# Patient Record
Sex: Female | Born: 1955
Health system: Southern US, Community
[De-identification: ages and names within clinical notes are randomized; demographics above are authoritative.]

## PROBLEM LIST (undated history)

## (undated) DIAGNOSIS — I10 Essential (primary) hypertension: Secondary | ICD-10-CM

## (undated) DIAGNOSIS — IMO0002 Reserved for concepts with insufficient information to code with codable children: Secondary | ICD-10-CM

## (undated) DIAGNOSIS — I214 Non-ST elevation (NSTEMI) myocardial infarction: Secondary | ICD-10-CM

## (undated) DIAGNOSIS — E785 Hyperlipidemia, unspecified: Secondary | ICD-10-CM

## (undated) DIAGNOSIS — M1990 Unspecified osteoarthritis, unspecified site: Secondary | ICD-10-CM

## (undated) DIAGNOSIS — M329 Systemic lupus erythematosus, unspecified: Secondary | ICD-10-CM

## (undated) DIAGNOSIS — M797 Fibromyalgia: Secondary | ICD-10-CM

## (undated) DIAGNOSIS — E669 Obesity, unspecified: Secondary | ICD-10-CM

## (undated) HISTORY — PX: ABDOMINAL HYSTERECTOMY: SHX81

## (undated) HISTORY — DX: Reserved for concepts with insufficient information to code with codable children: IMO0002

## (undated) HISTORY — DX: Essential (primary) hypertension: I10

## (undated) HISTORY — DX: Hyperlipidemia, unspecified: E78.5

## (undated) HISTORY — PX: OTHER SURGICAL HISTORY: SHX169

## (undated) HISTORY — DX: Obesity, unspecified: E66.9

## (undated) HISTORY — DX: Non-ST elevation (NSTEMI) myocardial infarction: I21.4

## (undated) HISTORY — PX: REDUCTION MAMMAPLASTY: SUR839

## (undated) HISTORY — DX: Systemic lupus erythematosus, unspecified: M32.9

## (undated) HISTORY — DX: Unspecified osteoarthritis, unspecified site: M19.90

## (undated) HISTORY — PX: BREAST SURGERY: SHX581

## (undated) HISTORY — PX: CARPAL TUNNEL RELEASE: SHX101

## (undated) HISTORY — DX: Fibromyalgia: M79.7

---

## 2010-05-01 ENCOUNTER — Ambulatory Visit: Payer: Self-pay | Admitting: Family Medicine

## 2010-05-07 ENCOUNTER — Ambulatory Visit (INDEPENDENT_AMBULATORY_CARE_PROVIDER_SITE_OTHER): Payer: Self-pay | Admitting: Family Medicine

## 2010-05-07 VITALS — BP 146/82 | HR 69 | Ht 61.0 in | Wt 240.0 lb

## 2010-05-07 DIAGNOSIS — M329 Systemic lupus erythematosus, unspecified: Secondary | ICD-10-CM

## 2010-05-07 DIAGNOSIS — J309 Allergic rhinitis, unspecified: Secondary | ICD-10-CM

## 2010-05-07 DIAGNOSIS — I10 Essential (primary) hypertension: Secondary | ICD-10-CM

## 2010-05-07 MED ORDER — HYDROXYCHLOROQUINE SULFATE 200 MG PO TABS: 200.0000 mg | ORAL_TABLET | Freq: Two times a day (BID) | ORAL | Status: DC

## 2010-05-07 MED ORDER — MOMETASONE FUROATE 50 MCG/ACT NA SUSP: 2.0000 | Freq: Every day | NASAL | Status: DC

## 2010-05-07 NOTE — Patient Instructions (Signed)
Referral to rheumatology made. Victorino Dike will call you w/ appt info for Dr. Ancil Linsey.  Plaquenil and Nasonex RFd today.  Work on [employer], regular exercise.  Return for f/u blood pressure/ wt in 2 months.

## 2010-05-20 ENCOUNTER — Encounter: Payer: Self-pay | Admitting: Emergency Medicine

## 2010-05-20 ENCOUNTER — Inpatient Hospital Stay (INDEPENDENT_AMBULATORY_CARE_PROVIDER_SITE_OTHER)
Admission: RE | Admit: 2010-05-20 | Discharge: 2010-05-20 | Disposition: A | Discharge status: Home / Self Care | Payer: Medicare PPO | Source: Ambulatory Visit | Attending: Emergency Medicine | Admitting: Emergency Medicine

## 2010-05-20 DIAGNOSIS — I10 Essential (primary) hypertension: Secondary | ICD-10-CM | POA: Insufficient documentation

## 2010-05-20 DIAGNOSIS — M810 Age-related osteoporosis without current pathological fracture: Secondary | ICD-10-CM | POA: Insufficient documentation

## 2010-05-20 DIAGNOSIS — E785 Hyperlipidemia, unspecified: Secondary | ICD-10-CM | POA: Insufficient documentation

## 2010-05-20 DIAGNOSIS — M1990 Unspecified osteoarthritis, unspecified site: Secondary | ICD-10-CM | POA: Insufficient documentation

## 2010-05-20 DIAGNOSIS — J019 Acute sinusitis, unspecified: Secondary | ICD-10-CM | POA: Insufficient documentation

## 2010-05-20 LAB — CONVERTED CEMR LAB: Rapid Strep: NEGATIVE

## 2010-06-26 ENCOUNTER — Encounter: Payer: Self-pay | Admitting: Family Medicine

## 2010-06-26 DIAGNOSIS — I10 Essential (primary) hypertension: Secondary | ICD-10-CM | POA: Insufficient documentation

## 2010-06-26 DIAGNOSIS — M3500 Sicca syndrome, unspecified: Secondary | ICD-10-CM | POA: Insufficient documentation

## 2010-06-26 NOTE — Assessment & Plan Note (Signed)
bp high today on metoprolol and hctz.  Will not change her today but have her return in 2 wks for a nurse bp check

## 2010-06-26 NOTE — Assessment & Plan Note (Signed)
BMI 45 c/w class III obesity. Will spend more time discussing wt mgmt in the near future.

## 2010-06-26 NOTE — Assessment & Plan Note (Signed)
Will get her back in with Dr Ancil Linsey and bridge her Plaquenil until her next appt.  Will try to get her last set of labs.

## 2010-06-26 NOTE — Progress Notes (Signed)
  Subjective:    Patient ID: Sharon Reilly, female    DOB: Jul 27, 1955, 55 y.o.   MRN: [id number]  HPI  55 yo WF presents for NOV.  She has a hx of SLE, followed by Dr Ancil Linsey, on Plaquenil.  She has HTN on HCTZ and metoprolol and thinks her labs are UTD.  She would like a PF on Plaquenil and NAsonex today.  She struggles w/ her wt, at at BMI of 45.    BP 146/82  Pulse 69  Ht 5\' 1"  (1.549 m)  Wt 240 lb (108.863 kg)  BMI 45.35 kg/m2  SpO2 98%     Review of Systems  Constitutional: Negative for fever and fatigue.  Eyes: Negative for visual disturbance.  Respiratory: Negative for shortness of breath and wheezing.   Cardiovascular: Negative for chest pain, palpitations and leg swelling.  Gastrointestinal: Negative for nausea, diarrhea and constipation.  Musculoskeletal: Positive for myalgias and arthralgias.  Neurological: Negative for weakness and headaches.  Psychiatric/Behavioral: Negative for sleep disturbance and dysphoric mood. The patient is not nervous/anxious.        Objective:   Physical Exam  Constitutional: She appears well-developed and well-nourished. No distress.       obese  Eyes: Pupils are equal, round, and reactive to light.  Neck: Neck supple. No thyromegaly present.  Cardiovascular: Normal rate, regular rhythm and normal heart sounds.   No murmur heard. Pulmonary/Chest: Effort normal and breath sounds normal. No respiratory distress. She has no wheezes.  Musculoskeletal: She exhibits no edema.  Skin: Skin is warm and dry.  Psychiatric: She has a normal mood and affect.          Assessment & Plan:

## 2010-06-29 ENCOUNTER — Encounter: Payer: Self-pay | Admitting: Family Medicine

## 2010-07-02 ENCOUNTER — Ambulatory Visit (INDEPENDENT_AMBULATORY_CARE_PROVIDER_SITE_OTHER): Payer: Medicare PPO | Admitting: Family Medicine

## 2010-07-02 ENCOUNTER — Encounter: Payer: Self-pay | Admitting: Family Medicine

## 2010-07-02 ENCOUNTER — Ambulatory Visit
Admission: RE | Admit: 2010-07-02 | Discharge: 2010-07-02 | Disposition: A | Discharge status: Home / Self Care | Payer: Medicare PPO | Source: Ambulatory Visit | Attending: Family Medicine | Admitting: Family Medicine

## 2010-07-02 VITALS — BP 131/64 | HR 76 | Ht 61.5 in | Wt 243.0 lb

## 2010-07-02 DIAGNOSIS — M5416 Radiculopathy, lumbar region: Secondary | ICD-10-CM | POA: Insufficient documentation

## 2010-07-02 DIAGNOSIS — IMO0002 Reserved for concepts with insufficient information to code with codable children: Secondary | ICD-10-CM

## 2010-07-02 MED ORDER — PREDNISONE 20 MG PO TABS: ORAL_TABLET | ORAL | Status: DC

## 2010-07-02 NOTE — Progress Notes (Signed)
  Subjective:    Patient ID: Sharon Reilly, female    DOB: June 01, 1955, 55 y.o.   MRN: [id number]  HPI 55 yo AAF presents for L foot pain.  She had surgery on her 1st metatarsal in 2000.  She continues to get pain and swelling in her L foot and leg.  She points to toes 2 and 3 on her R foot as where her foot hurts.  Describes pain as a burning numbness and also has pain in the L lower back and all the way down her leg on and off making ambulation difficult.  Denies change to bowel and bladder.  Has pain with sitting and standing.  Has not had imaging done for this.  She does have lupus, sees Dr Virgel Manifold, only on Plaqeunil.  BP 131/64  Pulse 76  Ht 5' 1.5" (1.562 m)  Wt 243 lb (110.224 kg)  BMI 45.17 kg/m2  SpO2 97%    Review of Systems as per HPI    Objective:   Physical Exam  Constitutional: She appears well-developed and well-nourished. No distress.       Morbidly obese  Cardiovascular: Normal rate, regular rhythm and normal heart sounds.   Pulmonary/Chest: Effort normal and breath sounds normal. No respiratory distress.  Musculoskeletal:       Lumbar back: She exhibits decreased range of motion and tenderness (very tender L L5 pedicle and at L sciatic notch).  Psychiatric: She has a normal mood and affect.          Assessment & Plan:

## 2010-07-02 NOTE — Patient Instructions (Signed)
Xray L spine downstairs now. Will call you w/ results tomorrow.  Start Prednisone taper 60-60-40-40-20-20-10-10 stop Avoid other anti inflammatories while you are on this.  Will get you scheduled to see ortho.  REturn for a PHYSICAL in 3 mos.

## 2010-07-02 NOTE — Assessment & Plan Note (Signed)
Her symptoms of pain in the L foot are likely to be lumbar radiculopathy.  Will treat with Prednisone taper and get an L spine Xray today.  She is morbidly obese and has lupus so likely has DDD with herniation.  Will set her up to see ortho.

## 2010-07-03 ENCOUNTER — Telehealth: Payer: Self-pay | Admitting: Family Medicine

## 2010-07-03 DIAGNOSIS — M47816 Spondylosis without myelopathy or radiculopathy, lumbar region: Secondary | ICD-10-CM | POA: Insufficient documentation

## 2010-07-03 NOTE — Telephone Encounter (Signed)
Pt aware of the above  

## 2010-07-03 NOTE — Telephone Encounter (Signed)
Pls let pt know that her XRAY shows mild arthritis in her lower back.  Remember that xrays do not look at the nerve roots.  Will see how she clinically responds to treatment and hold off on further imaging at this time.

## 2010-07-04 ENCOUNTER — Encounter: Payer: Self-pay | Admitting: Emergency Medicine

## 2010-07-04 ENCOUNTER — Inpatient Hospital Stay (INDEPENDENT_AMBULATORY_CARE_PROVIDER_SITE_OTHER)
Admission: RE | Admit: 2010-07-04 | Discharge: 2010-07-04 | Disposition: A | Discharge status: Home / Self Care | Payer: Medicare PPO | Source: Ambulatory Visit | Attending: Emergency Medicine | Admitting: Emergency Medicine

## 2010-07-04 DIAGNOSIS — Y92009 Unspecified place in unspecified non-institutional (private) residence as the place of occurrence of the external cause: Secondary | ICD-10-CM

## 2010-07-04 DIAGNOSIS — S61209A Unspecified open wound of unspecified finger without damage to nail, initial encounter: Secondary | ICD-10-CM | POA: Insufficient documentation

## 2010-07-04 DIAGNOSIS — W268XXA Contact with other sharp object(s), not elsewhere classified, initial encounter: Secondary | ICD-10-CM

## 2010-07-10 ENCOUNTER — Encounter: Payer: Self-pay | Admitting: Family Medicine

## 2010-07-10 DIAGNOSIS — M1990 Unspecified osteoarthritis, unspecified site: Secondary | ICD-10-CM

## 2010-07-10 DIAGNOSIS — M797 Fibromyalgia: Secondary | ICD-10-CM | POA: Insufficient documentation

## 2010-07-10 DIAGNOSIS — K603 Anal fistula: Secondary | ICD-10-CM

## 2010-07-10 DIAGNOSIS — D126 Benign neoplasm of colon, unspecified: Secondary | ICD-10-CM

## 2010-09-03 ENCOUNTER — Other Ambulatory Visit: Payer: Self-pay | Admitting: Family Medicine

## 2010-09-15 ENCOUNTER — Other Ambulatory Visit: Payer: Self-pay | Admitting: Family Medicine

## 2010-09-16 ENCOUNTER — Telehealth: Payer: Self-pay | Admitting: Family Medicine

## 2010-09-16 MED ORDER — FEXOFENADINE HCL 180 MG PO TABS: 180.0000 mg | ORAL_TABLET | Freq: Every day | ORAL | Status: AC

## 2010-09-16 MED ORDER — FLUTICASONE PROPIONATE 50 MCG/ACT NA SUSP: 1.0000 | Freq: Every day | NASAL | Status: DC

## 2010-09-16 NOTE — Telephone Encounter (Signed)
Pls let pt know that I filled Flonase and Allegra for her at [facility].

## 2010-09-16 NOTE — Telephone Encounter (Signed)
Advised pt of meds at pharmacy per Dr Cathey Endow.

## 2010-09-24 ENCOUNTER — Other Ambulatory Visit: Payer: Self-pay | Admitting: *Deleted

## 2010-09-24 ENCOUNTER — Encounter: Payer: Self-pay | Admitting: Family Medicine

## 2010-09-24 ENCOUNTER — Ambulatory Visit (INDEPENDENT_AMBULATORY_CARE_PROVIDER_SITE_OTHER): Payer: Medicare PPO | Admitting: Family Medicine

## 2010-09-24 DIAGNOSIS — E669 Obesity, unspecified: Secondary | ICD-10-CM

## 2010-09-24 DIAGNOSIS — M329 Systemic lupus erythematosus, unspecified: Secondary | ICD-10-CM

## 2010-09-24 DIAGNOSIS — Z0000 Encounter for general adult medical examination without abnormal findings: Secondary | ICD-10-CM

## 2010-09-24 DIAGNOSIS — Z1231 Encounter for screening mammogram for malignant neoplasm of breast: Secondary | ICD-10-CM

## 2010-09-24 DIAGNOSIS — I10 Essential (primary) hypertension: Secondary | ICD-10-CM

## 2010-09-24 DIAGNOSIS — E785 Hyperlipidemia, unspecified: Secondary | ICD-10-CM

## 2010-09-24 MED ORDER — GABAPENTIN 300 MG PO CAPS: 300.0000 mg | ORAL_CAPSULE | Freq: Two times a day (BID) | ORAL | Status: DC

## 2010-09-24 NOTE — Progress Notes (Signed)
  Subjective:    Patient ID: Sharon Reilly, female    DOB: 1955-09-20, 55 y.o.   MRN: [id number]  HPI 55 yo AAF with hx of SLE, chonic back pain, HTN and obesity presents for CPE w/o pap.  She had a normal pap in 2010.  She is married, monogamous.  She is due for fasting labs.  Had a colonoscopy done in 2008.  Had a DEXA done in Jan 2011.  She is seeing Dr Virgel Manifold for her lupus.  She also saw the orthopedist for her back pain and was supposed to start PT but she was [place] of [place] after her father passed away.  She is s/p TAH w/o BSO for fibroids.  She is due for her mammogram.  Her Tdap was updated < 10 yrs ago.    BP 155/74  Pulse 58  Temp(Src) 98.4 F (36.9 C) (Oral)  Ht 5' 1.5" (1.562 m)  Wt 244 lb (110.678 kg)  BMI 45.36 kg/m2   Review of Systems Gen: no fevers, chills, hot flashes, night sweats, change in weight GI: no N/V/C/D GU: no dysuria, incontinence or sexual dysfunction CV: no chest pain, DOE, palpitations s or edema Pulm:  Denies CP, SOB or chronic cough      Objective:   Physical Exam  Constitutional: She appears well-developed and well-nourished.       obese  HENT:  Mouth/Throat: Oropharynx is clear and moist.       Low hanging uvula with a short thick neck  Eyes: No scleral icterus.  Neck: No thyromegaly present.  Cardiovascular: Normal rate, regular rhythm and normal heart sounds.   Pulmonary/Chest: Effort normal and breath sounds normal. No respiratory distress.  Abdominal: Soft. Bowel sounds are normal. She exhibits no distension and no mass. There is no tenderness.  Musculoskeletal: She exhibits no edema.  Skin: Skin is warm and dry.  Psychiatric: She has a normal mood and affect.          Assessment & Plan:  Assesment:  1. CPE- Keeping healthy checklist for women reviewed today.  BP high today.   BMI 45 in the class III + obesity range.     Labs ordered Colonoscopy done 08, normal Will set up sleep apnea testing given her obesity, snoring, and  BP. Last pap 2010 but she had a TAH for non cancerous reasons.  No new relationships. Mammogram is due. Encouraged healthy diet, regular exercise, MVI daily. Return for next physical in 1 yr.   Her mobility / weight is limited by L hip / leg pain coming from her lower back - seeing Dr Shon Baton.  I added neurontin for her nerve root pain an drquested that she go ahead and set up PT as prescribed by Dr Shon Baton.

## 2010-09-24 NOTE — Patient Instructions (Signed)
Start Gabapentin (for nerve root pain ) 1 capsule at bedtime x 2 wks.  Increase to 2 x a day after that.  Will get you in with PT to start.  Update fasting labs and mammogram.  Return for f/u visit in 3 mos.

## 2010-09-30 ENCOUNTER — Ambulatory Visit
Admission: RE | Admit: 2010-09-30 | Discharge: 2010-09-30 | Disposition: A | Discharge status: Home / Self Care | Payer: Medicare PPO | Source: Ambulatory Visit | Attending: Family Medicine | Admitting: Family Medicine

## 2010-09-30 DIAGNOSIS — Z1231 Encounter for screening mammogram for malignant neoplasm of breast: Secondary | ICD-10-CM

## 2010-09-30 LAB — CBC WITH DIFFERENTIAL/PLATELET
Eosinophils Absolute: 0.2 10*3/uL (ref 0.0–0.7)
Eosinophils Relative: 6 % — ABNORMAL HIGH (ref 0–5)
Hemoglobin: 11.6 g/dL — ABNORMAL LOW (ref 12.0–15.0)
Lymphocytes Relative: 48 % — ABNORMAL HIGH (ref 12–46)
Lymphs Abs: 1.5 10*3/uL (ref 0.7–4.0)
MCH: 30.1 pg (ref 26.0–34.0)
MCV: 92.5 fL (ref 78.0–100.0)
Monocytes Relative: 7 % (ref 3–12)
RBC: 3.86 MIL/uL — ABNORMAL LOW (ref 3.87–5.11)
WBC: 3.1 10*3/uL — ABNORMAL LOW (ref 4.0–10.5)

## 2010-09-30 LAB — LIPID PANEL
HDL: 49 mg/dL (ref >39)
LDL Cholesterol: 114 mg/dL — ABNORMAL HIGH (ref 0–99)

## 2010-10-01 LAB — COMPLETE METABOLIC PANEL WITH GFR
ALT: 26 U/L (ref 0–35)
CO2: 25 mEq/L (ref 19–32)
Calcium: 9.3 mg/dL (ref 8.4–10.5)
Chloride: 106 mEq/L (ref 96–112)
Creat: 0.96 mg/dL (ref 0.50–1.10)
GFR, Est African American: >60 mL/min (ref >60)
GFR, Est Non African American: 60 mL/min — ABNORMAL LOW (ref >60)
Glucose, Bld: 97 mg/dL (ref 70–99)
Sodium: 142 mEq/L (ref 135–145)
Total Bilirubin: 0.4 mg/dL (ref 0.3–1.2)
Total Protein: 6.7 g/dL (ref 6.0–8.3)

## 2010-10-01 LAB — VITAMIN D 25 HYDROXY (VIT D DEFICIENCY, FRACTURES): Vit D, 25-Hydroxy: 41 ng/mL (ref 30–89)

## 2010-10-06 ENCOUNTER — Telehealth: Payer: Self-pay | Admitting: Family Medicine

## 2010-10-06 ENCOUNTER — Inpatient Hospital Stay (INDEPENDENT_AMBULATORY_CARE_PROVIDER_SITE_OTHER)
Admission: RE | Admit: 2010-10-06 | Discharge: 2010-10-06 | Disposition: A | Discharge status: Home / Self Care | Payer: Medicare PPO | Source: Ambulatory Visit | Attending: Emergency Medicine | Admitting: Emergency Medicine

## 2010-10-06 ENCOUNTER — Encounter: Payer: Self-pay | Admitting: Emergency Medicine

## 2010-10-06 DIAGNOSIS — J0100 Acute maxillary sinusitis, unspecified: Secondary | ICD-10-CM

## 2010-10-06 DIAGNOSIS — H66009 Acute suppurative otitis media without spontaneous rupture of ear drum, unspecified ear: Secondary | ICD-10-CM | POA: Insufficient documentation

## 2010-10-06 NOTE — Telephone Encounter (Signed)
Fax copy of labs to her rheumatologist.  Pls let pt know that her WBC is a little low at 3.1 with a mild anemia.  Her sugar, liver and kidney function looks normal.  Cholesterol is at goal.  Vitamin D is normal.  Stay on OTC vitamin D 1,000 IU / day for maintenance (last colonoscopy 07)./

## 2010-10-08 NOTE — Telephone Encounter (Signed)
Pt aware.

## 2010-11-19 ENCOUNTER — Other Ambulatory Visit: Payer: Self-pay | Admitting: Family Medicine

## 2010-12-04 ENCOUNTER — Other Ambulatory Visit: Payer: Self-pay | Admitting: *Deleted

## 2010-12-04 MED ORDER — ALENDRONATE SODIUM 70 MG PO TABS: 70.0000 mg | ORAL_TABLET | ORAL | Status: DC

## 2010-12-11 ENCOUNTER — Other Ambulatory Visit: Payer: Self-pay | Admitting: *Deleted

## 2010-12-11 MED ORDER — METOPROLOL TARTRATE 50 MG PO TABS: 100.0000 mg | ORAL_TABLET | Freq: Two times a day (BID) | ORAL | Status: DC

## 2010-12-22 ENCOUNTER — Other Ambulatory Visit: Payer: Self-pay | Admitting: *Deleted

## 2010-12-22 DIAGNOSIS — M329 Systemic lupus erythematosus, unspecified: Secondary | ICD-10-CM

## 2010-12-22 MED ORDER — HYDROCHLOROTHIAZIDE 12.5 MG PO CAPS: 12.5000 mg | ORAL_CAPSULE | ORAL | Status: DC

## 2010-12-23 ENCOUNTER — Other Ambulatory Visit: Payer: Self-pay | Admitting: Family Medicine

## 2010-12-24 ENCOUNTER — Ambulatory Visit (INDEPENDENT_AMBULATORY_CARE_PROVIDER_SITE_OTHER): Payer: Medicare PPO | Admitting: Family Medicine

## 2010-12-24 DIAGNOSIS — Z23 Encounter for immunization: Secondary | ICD-10-CM

## 2010-12-24 NOTE — Progress Notes (Signed)
Here for flu shot today. 

## 2010-12-25 ENCOUNTER — Other Ambulatory Visit: Payer: Self-pay | Admitting: Family Medicine

## 2011-01-19 NOTE — Progress Notes (Signed)
Summary: headache/ear pressure/face hurts (rm 5)   Vital Signs:  Patient Profile:   55 Years Old Female CC:      HA, facial pain, eye pain x 5-6 days Height:     61.5 inches Weight:      243 pounds O2 Sat:      96 % O2 treatment:    Room Air Temp:     100.2 degrees F oral Pulse rate:   75 / minute Resp:     18 per minute BP sitting:   138 / 76  (right arm) Cuff size:   large  Pt. in pain?   yes    Location:   head    Intensity:   10    Type:       aching  Vitals Entered By: Lajean Saver RN (October 06, 2010 1:23 PM)                   Updated Prior Medication List: SIMVASTATIN 10 MG TABS (SIMVASTATIN)  PLAQUENIL 200 MG TABS (HYDROXYCHLOROQUINE SULFATE)  HYDROCHLOROTHIAZIDE 12.5 MG CAPS (HYDROCHLOROTHIAZIDE)  PRILOSEC 20 MG CPDR (OMEPRAZOLE)  METOPROLOL TARTRATE 50 MG TABS (METOPROLOL TARTRATE)  FISH OIL  OIL (FISH OIL)  ASPIRIN EC LOW DOSE 81 MG TBEC (ASPIRIN)  ALENDRONATE SODIUM 70 MG TABS (ALENDRONATE SODIUM)  TYLENOL ARTHRITIS PAIN 650 MG CR-TABS (ACETAMINOPHEN)  TRAMADOL HCL 50 MG TABS (TRAMADOL HCL)  NASONEX 50 MCG/ACT SUSP (MOMETASONE FUROATE)  FEXOFENADINE HCL 180 MG TABS (FEXOFENADINE HCL)  PREDNISONE 5 MG TABS (PREDNISONE)  GABAPENTIN 300 MG CAPS (GABAPENTIN) prn  Current Allergies (reviewed today): ! ERYTHROMYCIN ! SULFAHistory of Present Illness History from: patient Chief Complaint: HA, facial pain, eye pain x 5-6 days History of Present Illness: Pt complains of  6 days of congestion, facial and R orbital pressure, sinus drainage., and R ear pressure.  She gets these type of acute sinusitis flairur-ups periodically, and Augmentin has worked well for this in the past. No sore throat. No cough, No dyspnea. No chest pain. No wheezing.  No nausea No vomiting. + fever, + chills   REVIEW OF SYSTEMS Constitutional Symptoms      Denies fever, chills, night sweats, weight loss, weight gain, and fatigue.  Eyes       Complains of eye pain.      Denies  change in vision, eye discharge, glasses, contact lenses, and eye surgery. Ear/Nose/Throat/Mouth       Complains of sinus problems.      Denies hearing loss/aids, change in hearing, ear pain, ear discharge, dizziness, frequent runny nose, frequent nose bleeds, sore throat, hoarseness, and tooth pain or bleeding.  Respiratory       Denies dry cough, productive cough, wheezing, shortness of breath, asthma, bronchitis, and emphysema/COPD.  Cardiovascular       Denies murmurs, chest pain, and tires easily with exhertion.    Gastrointestinal       Denies stomach pain, nausea/vomiting, diarrhea, constipation, blood in bowel movements, and indigestion. Genitourniary       Denies painful urination, blood or discharge from vagina, kidney stones, and loss of urinary control. Neurological       Complains of headaches.      Denies paralysis, seizures, and fainting/blackouts. Musculoskeletal       Denies muscle pain, joint pain, joint stiffness, decreased range of motion, redness, swelling, muscle weakness, and gout.  Skin       Denies bruising, unusual mles/lumps or sores, and hair/skin or nail changes.  Psych  Denies mood changes, temper/anger issues, anxiety/stress, speech problems, depression, and sleep problems. Other Comments: Patient c/o facial, eye, and head ache x 5-6 days. She has not taken any OTC meds for relief. Hx of frequent sinus infections   Past History:  Past Medical History: Reviewed history from 05/20/2010 and no changes required. Hyperlipidemia Hypertension Osteoarthritis Osteoporosis Lupus Fibromyalgia  Past Surgical History: Reviewed history from 05/20/2010 and no changes required. LT great toe fusion partial Hysterectomy breast reduction LT epicondylitis LT carpal tunnel release RT elbow Talonavicular joint fusion LT foot Surgisis Biodesign fistula plug Fistulotomy MRI syr ortho spec ( dr Virgie Dad)   Family History: Reviewed history from 05/20/2010 and no  changes required. mom- Family History Breast cancer 1st degree relative <50 Family History High cholesterol Family History Hypertension father- hyperlipidemia  Social History: Reviewed history from 05/20/2010 and no changes required. Never Smoked Alcohol use-no Drug use-no Physical Exam General appearance: obese, well developed, well nourished, no acute distress, but uncomfortable Head: + ttp R maxillary sinus Eyes: conjunctivae and lids normal Ears: inflamed, red right and left TMs Nasal: swollen red turbinates with congestion Oral/Pharynx: tongue normal, posterior pharynx without erythema or exudate Neck: supple,anterior lymphadenopathy present Chest/Lungs: no rales, wheezes, or rhonchi bilateral, breath sounds equal without effort Heart: regular rate and  rhythm, no murmur MSE: oriented to time, place, and person Assessment New Problems: SINUSITIS, MAXILLARY, ACUTE (ICD-461.0) ACUT SUPPRATV OTITIS MEDIA W/O SPONT RUP EARDRUM (ICD-382.00)   Plan New Medications/Changes: AUGMENTIN 875-125 MG TABS (AMOXICILLIN-POT CLAVULANATE) 1 two times a day for 10 days  #20 x 0, 10/06/2010, Lajean Manes MD  New Orders: Est. Patient Level III [16109] Planning Comments:   augmentin rx. Has nasonex rx at home. Use saline nasal irrigation. Other modalities discussed. Mucinex.  Follow Up: Follow up with Primary Physician Follow Up: 7-10 days, sooner prn. Also, f/u with ENT, but pt prefers to obtain referral through her pcp.  The patient and/or caregiver has been counseled thoroughly with regard to medications prescribed including dosage, schedule, interactions, rationale for use, and possible side effects and they verbalize understanding.  Diagnoses and expected course of recovery discussed and will return if not improved as expected or if the condition worsens. Patient and/or caregiver verbalized understanding.  Prescriptions: AUGMENTIN 875-125 MG TABS (AMOXICILLIN-POT CLAVULANATE) 1 two  times a day for 10 days  #20 x 0   Entered and Authorized by:   Lajean Manes MD   Signed by:   Lajean Manes MD on 10/06/2010   Method used:   Handwritten   RxID:   [id number]   Orders Added: 1)  Est. Patient Level III [95621]

## 2011-01-19 NOTE — Progress Notes (Signed)
Summary: LACERATION Procedure room   Vital Signs:  Patient Profile:   55 Years Old Female CC:      Laceration Right index finger washing a glass today Height:     61.5 inches Weight:      245 pounds O2 Sat:      984 % O2 treatment:    Room Air Temp:     98.4 degrees F oral Pulse rate:   71 / minute Pulse rhythm:   regular Resp:     16 per minute BP sitting:   136 / 82  (left arm) Cuff size:   large  Vitals Entered By: Emilio Math (Jul 04, 2010 9:23 AM)                  Current Allergies (reviewed today): ! ERYTHROMYCIN ! SULFAHistory of Present Illness Chief Complaint: Laceration Right index finger washing a glass today History of Present Illness: Laceration to R index finger while washing a glass in her sink today.  Everything was soapy and clean.  She doesn't recall her last Td.  It bled a lot initially but has stopped with pressure.  Mildly sore as well.  No other cuts or trauma.  She is R handed.  Current Meds SIMVASTATIN 10 MG TABS (SIMVASTATIN)  PLAQUENIL 200 MG TABS (HYDROXYCHLOROQUINE SULFATE)  HYDROCHLOROTHIAZIDE 12.5 MG CAPS (HYDROCHLOROTHIAZIDE)  PRILOSEC 20 MG CPDR (OMEPRAZOLE)  METOPROLOL TARTRATE 50 MG TABS (METOPROLOL TARTRATE)  FISH OIL  OIL (FISH OIL)  ASPIRIN EC LOW DOSE 81 MG TBEC (ASPIRIN)  ALENDRONATE SODIUM 70 MG TABS (ALENDRONATE SODIUM)  TYLENOL ARTHRITIS PAIN 650 MG CR-TABS (ACETAMINOPHEN)  TRAMADOL HCL 50 MG TABS (TRAMADOL HCL)  NASONEX 50 MCG/ACT SUSP (MOMETASONE FUROATE)  FEXOFENADINE HCL 180 MG TABS (FEXOFENADINE HCL)  PREDNISONE 5 MG TABS (PREDNISONE)   REVIEW OF SYSTEMS Constitutional Symptoms      Denies fever, chills, night sweats, weight loss, weight gain, and fatigue.  Eyes       Denies change in vision, eye pain, eye discharge, glasses, contact lenses, and eye surgery. Ear/Nose/Throat/Mouth       Denies hearing loss/aids, change in hearing, ear pain, ear discharge, dizziness, frequent runny nose, frequent nose bleeds, sinus  problems, sore throat, hoarseness, and tooth pain or bleeding.  Respiratory       Denies dry cough, productive cough, wheezing, shortness of breath, asthma, bronchitis, and emphysema/COPD.  Cardiovascular       Denies murmurs, chest pain, and tires easily with exhertion.    Gastrointestinal       Denies stomach pain, nausea/vomiting, diarrhea, constipation, blood in bowel movements, and indigestion. Genitourniary       Denies painful urination, kidney stones, and loss of urinary control. Neurological       Denies paralysis, seizures, and fainting/blackouts. Musculoskeletal       Denies muscle pain, joint pain, joint stiffness, decreased range of motion, redness, swelling, muscle weakness, and gout.  Skin       Denies bruising, unusual mles/lumps or sores, and hair/skin or nail changes.      Comments: Lac on right first finger Psych       Denies mood changes, temper/anger issues, anxiety/stress, speech problems, depression, and sleep problems.  Past History:  Past Medical History: Reviewed history from 05/20/2010 and no changes required. Hyperlipidemia Hypertension Osteoarthritis Osteoporosis Lupus Fibromyalgia  Past Surgical History: Reviewed history from 05/20/2010 and no changes required. LT great toe fusion partial Hysterectomy breast reduction LT epicondylitis LT carpal tunnel release RT elbow Talonavicular  joint fusion LT foot Surgisis Biodesign fistula plug Fistulotomy MRI syr ortho spec ( dr Virgie Dad)   Family History: Reviewed history from 05/20/2010 and no changes required. mom- Family History Breast cancer 1st degree relative <50 Family History High cholesterol Family History Hypertension father- hyperlipidemia  Social History: Reviewed history from 05/20/2010 and no changes required. Never Smoked Alcohol use-no Drug use-no Physical Exam General appearance: well developed, well nourished, no acute distress Head: normocephalic, atraumatic MSE:  oriented to time, place, and person R dorsal index finger with a V shaped flap of skin just distal to the MCP joint.  Mild bleeding is is present.  No foreign bodies seen.  FROM flex & ext of DIP, PIP, and MCP.  Distal NV status intact.  No signs of active infection. No other lacerations or bruises seen.  No swelling. Assessment New Problems: OPEN WOUND FINGER WITHOUT MENTION COMPLICATION (ICD-883.0)   Plan New Orders: Tdap => 11yrs IM [90715] Admin 1st Vaccine [90471] Est. Patient Level IV [09811] Application finger Splint [29130] Repair Superficial Wound(s) 2.5cm or <(scalp,neck,axillae,ext gent,trunk,extre) [12001] Planning Comments:   See procedure note.  Keep clean and dry. Wear finger splint but be aware it may cause stiffness.  Sutures will be removed in 9-10 days.    The patient and/or caregiver has been counseled thoroughly with regard to medications prescribed including dosage, schedule, interactions, rationale for use, and possible side effects and they verbalize understanding.  Diagnoses and expected course of recovery discussed and will return if not improved as expected or if the condition worsens. Patient and/or caregiver verbalized understanding.   PROCEDURE:  Suture Site: R dorsal index finger distal to MCP Size: 2cm Number of Lacerations: 1 Procedure: Risks, benefits and alternatives discussed with patient.  They voice understanding. Discussed benefits and risks of procedure and verbal consent obtained.  Using sterile technique and local 1% lidocaine withOUT epinephrine, cleansed wound with hibaclens followed by copious lavage with normal saline.  Wound carefully inspected for debris and foreign bodies; none found.  Wound closed with #4 , 5-0 interrupted nylon sutures.  Placed dermabond on it, then non-stick sterile dressing applied. Then used finger splint to stabilize finger.  Wound precautions explained to patient.   Orders Added: 1)  Tdap => 109yrs IM [90715] 2)   Admin 1st Vaccine [90471] 3)  Est. Patient Level IV [91478] 4)  Application finger Splint [29130] 5)  Repair Superficial Wound(s) 2.5cm or <(scalp,neck,axillae,ext gent,trunk,extre) [12001]   Immunizations Administered:  Tetanus Vaccine:    Vaccine Type: Tdap    Site: left deltoid    Mfr: Boostrix    Dose: 0.5 ml    Route: IM    Given by: Emilio Math    Exp. Date: 12/06/2011    Lot #: [id number]    VIS given: 01/04/08 version given Jul 04, 2010.    Physician counseled: yes   Immunizations Administered:  Tetanus Vaccine:    Vaccine Type: Tdap    Site: left deltoid    Mfr: Boostrix    Dose: 0.5 ml    Route: IM    Given by: Emilio Math    Exp. Date: 12/06/2011    Lot #: [id number]    VIS given: 01/04/08 version given Jul 04, 2010.    Physician counseled: yes

## 2011-01-19 NOTE — Progress Notes (Signed)
Summary: SORE THROAT,COUGH,HEADACHE ,PAIN IN EARS rm 4   Vital Signs:  Patient Profile:   55 Years Old Female CC:      sore throat,cough, HA, bilateral ear pain x 2 days Height:     61.5 inches Weight:      245 pounds O2 Sat:      98 % O2 treatment:    Room Air Temp:     99.3 degrees F oral Pulse rate:   63 / minute Resp:     20 per minute BP sitting:   149 / 81  (left arm) Cuff size:   large  Vitals Entered By: Clemens Catholic LPN (May 20, 1094 12:58 PM)                  Updated Prior Medication List: SIMVASTATIN 10 MG TABS (SIMVASTATIN)  PLAQUENIL 200 MG TABS (HYDROXYCHLOROQUINE SULFATE)  HYDROCHLOROTHIAZIDE 12.5 MG CAPS (HYDROCHLOROTHIAZIDE)  PRILOSEC 20 MG CPDR (OMEPRAZOLE)  METOPROLOL TARTRATE 50 MG TABS (METOPROLOL TARTRATE)  FISH OIL  OIL (FISH OIL)  ASPIRIN EC LOW DOSE 81 MG TBEC (ASPIRIN)  ALEVE 220 MG TABS (NAPROXEN SODIUM)  ALENDRONATE SODIUM 70 MG TABS (ALENDRONATE SODIUM)  TYLENOL ARTHRITIS PAIN 650 MG CR-TABS (ACETAMINOPHEN)  TRAMADOL HCL 50 MG TABS (TRAMADOL HCL)  NASONEX 50 MCG/ACT SUSP (MOMETASONE FUROATE)  FEXOFENADINE HCL 180 MG TABS (FEXOFENADINE HCL)   Current Allergies (reviewed today): ! ERYTHROMYCIN ! SULFAHistory of Present Illness History from: patient Chief Complaint: sore throat,cough, HA, bilateral ear pain x 2 days History of Present Illness: Pt complains of 2 days of congestion, yellow rhinorrhea.  +sore throat. + cough,occas. yellow sputum No dyspnea. No chest pain. No wheezing.  No nausea No vomiting. No fever, No chills. + bilat earache.No drainage from ears.   REVIEW OF SYSTEMS Constitutional Symptoms      Denies fever, chills, night sweats, weight loss, weight gain, and fatigue.  Eyes       Denies change in vision, eye pain, eye discharge, glasses, contact lenses, and eye surgery. Ear/Nose/Throat/Mouth       Complains of ear pain and sore throat.      Denies hearing loss/aids, change in hearing, ear discharge,  dizziness, frequent runny nose, frequent nose bleeds, sinus problems, hoarseness, and tooth pain or bleeding.  Respiratory       Complains of productive cough.      Denies dry cough, wheezing, shortness of breath, asthma, bronchitis, and emphysema/COPD.  Cardiovascular       Denies murmurs, chest pain, and tires easily with exhertion.    Gastrointestinal       Denies stomach pain, nausea/vomiting, diarrhea, constipation, blood in bowel movements, and indigestion. Genitourniary       Denies painful urination, kidney stones, and loss of urinary control. Neurological       Complains of headaches.      Denies paralysis, seizures, and fainting/blackouts. Musculoskeletal       Denies muscle pain, joint pain, joint stiffness, decreased range of motion, redness, swelling, muscle weakness, and gout.  Skin       Denies bruising, unusual mles/lumps or sores, and hair/skin or nail changes.  Psych       Denies mood changes, temper/anger issues, anxiety/stress, speech problems, depression, and sleep problems. Other Comments: pt c/o bilateral ear ache, sore throat, productive cough,  and HA x 2 days. she has not taken any OTC meds. no fever.   Past History:  Past Medical History: Hyperlipidemia Hypertension Osteoarthritis Osteoporosis Lupus Fibromyalgia  Past  Surgical History: LT great toe fusion partial Hysterectomy breast reduction LT epicondylitis LT carpal tunnel release RT elbow Talonavicular joint fusion LT foot Surgisis Biodesign fistula plug Fistulotomy MRI syr ortho spec ( dr [employer], [employer])   Family History: mom- Family History Breast cancer 1st degree relative <50 Family History High cholesterol Family History Hypertension father- hyperlipidemia  Social History: Never Smoked Alcohol use-no Drug use-no Smoking Status:  never Drug Use:  no Physical Exam General appearance: well developed, well nourished, no acute distress Head: normocephalic, atraumatic Eyes: conjunctivae  and lids normal Ears: fluid noted without inflammation bilateral TM Nasal: + yellow sero-mucoid drainage. Oral/Pharynx: pharyngeal erythema with exudate, uvula midline without deviation Neck: supple,anterior lymphadenopathy present Chest/Lungs: no rales, wheezes, or rhonchi bilateral, breath sounds equal without effort Heart: regular rate and  rhythm, no murmur Skin: no obvious rashes or lesions Assessment New Problems: SINUSITIS, ACUTE (ICD-461.9) PHARYNGITIS, ACUTE (ICD-462.0) FAMILY HISTORY BREAST CANCER 1ST DEGREE RELATIVE <50 (ICD-V16.3) OSTEOPOROSIS (ICD-733.00) OSTEOARTHRITIS (ICD-715.90) HYPERTENSION (ICD-401.9) HYPERLIPIDEMIA (ICD-272.4)  rst negative  Patient Education: Patient and/or caregiver instructed in the following: rest fluids and Tylenol. otc decongestant, but precautions discussed. Robitussin DM as needed. Risks, benefits, alternatives discussed. Pt voiced understanding and agreement.   Plan New Medications/Changes: AMOXICILLIN 875 MG TABS (AMOXICILLIN) 1 two times a day for 10 days  #20 x 0, 05/20/2010, Lajean Manes MD  New Orders: Rapid Strep [phone] New Patient Level III [60454] Follow Up: Follow up on an as needed basis, Follow up with Primary Physician  The patient and/or caregiver has been counseled thoroughly with regard to medications prescribed including dosage, schedule, interactions, rationale for use, and possible side effects and they verbalize understanding.  Diagnoses and expected course of recovery discussed and will return if not improved as expected or if the condition worsens. Patient and/or caregiver verbalized understanding.  Prescriptions: AMOXICILLIN 875 MG TABS (AMOXICILLIN) 1 two times a day for 10 days  #20 x 0   Entered and Authorized by:   Lajean Manes MD   Signed by:   Lajean Manes MD on 05/20/2010   Method used:   Handwritten   RxID:   [id number]   Orders Added: 1)  Rapid Strep [30865] 2)  New Patient Level III  [78469]    Laboratory Results  Date/Time Received: May 20, 2010 1:28 PM  Date/Time Reported: May 20, 2010 1:28 PM   Other Tests  Rapid Strep: negative  Kit Test Internal QC: Negative   (Normal Range: Negative)

## 2011-01-20 ENCOUNTER — Encounter: Payer: Medicare PPO | Admitting: Family Medicine

## 2011-01-26 ENCOUNTER — Other Ambulatory Visit: Payer: Self-pay | Admitting: Family Medicine

## 2011-02-23 ENCOUNTER — Other Ambulatory Visit: Payer: Self-pay | Admitting: Family Medicine

## 2011-03-10 ENCOUNTER — Other Ambulatory Visit: Payer: Self-pay | Admitting: Family Medicine

## 2011-03-23 ENCOUNTER — Encounter: Payer: Self-pay | Admitting: Family Medicine

## 2011-03-23 ENCOUNTER — Ambulatory Visit (INDEPENDENT_AMBULATORY_CARE_PROVIDER_SITE_OTHER): Payer: Medicare PPO | Admitting: Family Medicine

## 2011-03-23 VITALS — BP 150/81 | HR 61 | Temp 98.1°F | Wt 246.0 lb

## 2011-03-23 DIAGNOSIS — I10 Essential (primary) hypertension: Secondary | ICD-10-CM

## 2011-03-23 DIAGNOSIS — E785 Hyperlipidemia, unspecified: Secondary | ICD-10-CM

## 2011-03-23 DIAGNOSIS — H9209 Otalgia, unspecified ear: Secondary | ICD-10-CM

## 2011-03-23 MED ORDER — SIMVASTATIN 20 MG PO TABS: 20.0000 mg | ORAL_TABLET | Freq: Every day | ORAL | Status: DC

## 2011-03-23 MED ORDER — METOPROLOL TARTRATE 100 MG PO TABS: 100.0000 mg | ORAL_TABLET | Freq: Two times a day (BID) | ORAL | Status: DC

## 2011-03-23 NOTE — Patient Instructions (Signed)

## 2011-03-23 NOTE — Progress Notes (Signed)
  Subjective:    Patient ID: Sharon Reilly, female    DOB: 08-26-1955, 56 y.o.   MRN: [id number]  Hypertension This is a chronic problem. The current episode started more than 1 year ago. The problem is controlled. Pertinent negatives include no blurred vision, chest pain or shortness of breath. There are no associated agents to hypertension. Risk factors for coronary artery disease include obesity and sedentary lifestyle. Past treatments include beta blockers and diuretics. The current treatment provides mild improvement. There are no compliance problems.    Otalgia  For a couple of weeks.  Has a hx of allergies.  No hearing loss or drainage. Not really using her allergy meds consistantly right now.    Review of Systems  Eyes: Negative for blurred vision.  Respiratory: Negative for shortness of breath.   Cardiovascular: Negative for chest pain.       Objective:   Physical Exam  Constitutional: She is oriented to person, place, and time. She appears well-developed and well-nourished.  HENT:  Head: Normocephalic and atraumatic.  Right Ear: External ear normal.  Left Ear: External ear normal.  Nose: Nose normal.  Eyes: Conjunctivae are normal. Pupils are equal, round, and reactive to light.  Neck: Neck supple. No thyromegaly present.  Cardiovascular: Normal rate, regular rhythm and normal heart sounds.   Pulmonary/Chest: Effort normal and breath sounds normal.  Lymphadenopathy:    She has no cervical adenopathy.  Neurological: She is alert and oriented to person, place, and time.  Skin: Skin is warm and dry.  Psychiatric: She has a normal mood and affect. Her behavior is normal.          Assessment & Plan:  HTN - not well controlled but she just took her medications about 15 minutes today. I am worried that the blood pressure medications may be running out too quickly. She also notes that she has gained 30 pounds since moving here a year ago and that certainly could be  contributing. She says she plans on joining the [facility]. but so far has not started a regular exercise program. She does occasionally use her we did for exercise. We also reviewed eating a low salt diet and hopefully to see her back in one to 2 weeks for nurse blood pressure check to reassess her blood pressure. If still elevated then I would like to increase her diuretic to 25 mg and have her continue to work on diet and weight loss.    Otalgia - Ear exam is normal. Gave reassurance.  Is this the importance of treating her allergic rhinitis. She does restart her oral antihistamine as well as a nasal steroid sprays. She has not had any seeing any significant improvement in her symptoms in 2 weeks then call the office back.   Hyperlipidemia-she had cholesterol done back in August her LDL is slightly elevated. Unfortunately her simvastatin was not adjusted. So I will increase to 20 mg today and we can recheck her lipids in 8 weeks.

## 2011-06-10 ENCOUNTER — Other Ambulatory Visit: Payer: Self-pay | Admitting: Family Medicine

## 2011-06-22 ENCOUNTER — Emergency Department
Admission: EM | Admit: 2011-06-22 | Discharge: 2011-06-22 | Disposition: A | Discharge status: Home / Self Care | Payer: Medicare PPO | Source: Home / Self Care | Attending: Family Medicine | Admitting: Family Medicine

## 2011-06-22 ENCOUNTER — Encounter: Payer: Self-pay | Admitting: *Deleted

## 2011-06-22 DIAGNOSIS — J310 Chronic rhinitis: Secondary | ICD-10-CM

## 2011-06-22 DIAGNOSIS — R202 Paresthesia of skin: Secondary | ICD-10-CM

## 2011-06-22 DIAGNOSIS — R209 Unspecified disturbances of skin sensation: Secondary | ICD-10-CM

## 2011-06-22 MED ORDER — PREDNISONE 10 MG PO TABS: ORAL_TABLET | ORAL | Status: DC

## 2011-06-22 NOTE — Discharge Instructions (Signed)
Continue fluticasone nasal spray.  Call if rash develops. If symptoms become significantly worse during the night or over the weekend, proceed to the local emergency room.

## 2011-06-22 NOTE — ED Provider Notes (Signed)
History     CSN: [id number]  Arrival date & time 06/22/11  1239   First MD Initiated Contact with Patient 06/22/11 1257      Chief Complaint  Patient presents with  . Sinus Problem  . Numbness    left side of mouth      HPI Comments: Patient complains of increased nasal "dryness" for about one week, with increased nasal itching and sneezing.  She has also had occasional bloody discharge (but not a nose bleed).  She applied some Neosporin ointment in her nose yesterday, and subsequently developed a vague sensation of numbness in the area beneath her nose and lateral to the left of her mouth.  She has had no facial weakness or pain.  No fevers, chills, and sweats.  No changes in vision.  She has been taking fexofenadine and fluticasone nasal spray for allergy.  The history is provided by the patient.    Past Medical History  Diagnosis Date  . Lupus (systemic lupus erythematosus)   . Hypertension   . Obesity   . Hyperlipidemia   . Osteoarthritis   . Osteoporosis   . Lupus   . Fibromyalgia     Past Surgical History  Procedure Date  . Great toe fusion     left  . Abdominal hysterectomy     partial  . Breast surgery     reduction  . Epicondylitis     left  . Carpal tunnel release     left  . Right elbow   . Talonavicular joint fusion     LT foot  . Surgisis biodesign fistula plug   . Fistulotomy   . Mri syr ortho spec     Dr. Virgie Dad    Family History   Problem Relation Age of Onset  . Cancer Mother   . Hyperlipidemia Mother   . Hypertension Mother   . Hyperlipidemia Father     History  Substance Use Topics  . Smoking status: Never Smoker   . Smokeless tobacco: Not on file  . Alcohol Use: No    OB History    Grav Para Term Preterm Abortions TAB SAB Ect Mult Living                  Review of Systems  Constitutional: Negative.   HENT: Positive for ear pain, congestion, rhinorrhea and postnasal drip. Negative for hearing loss, nosebleeds, sore throat, facial swelling, sneezing, drooling, mouth sores, trouble swallowing, neck pain, neck stiffness, dental problem, voice change, sinus pressure and ear discharge.   Eyes: Negative.   Respiratory: Negative.   Cardiovascular: Negative.   Gastrointestinal: Negative.   Genitourinary: Negative.   Musculoskeletal: Negative.   Neurological: Positive for numbness. Negative for dizziness, tremors, seizures, syncope, facial asymmetry, speech difficulty, weakness, light-headedness and headaches.    Allergies  Erythromycin; Sulfonamide derivatives; and Sulfa antibiotics  Home Medications   Current Outpatient Rx  Name Route Sig Dispense Refill  . ACETAMINOPHEN ER 650 MG PO TBCR Oral Take 650 mg by mouth every 8 (eight) hours as needed.      . ALENDRONATE SODIUM 70 MG PO TABS  take 1 tablet by mouth every 7 days WITH A FULL GLASS OF WATER ON AN EMPTY STOMACH 12 tablet 0  . ASPIRIN 81 MG PO TABS Oral Take 81 mg by mouth daily.      [name] FEXOFENADINE HCL 180 MG PO TABS Oral Take 1 tablet (180 mg total) by mouth daily. 30 tablet 6  . OMEGA-3 FATTY ACIDS 1000 MG PO CAPS Oral Take 1 g by mouth daily.      [name] FLUTICASONE PROPIONATE 50 MCG/ACT NA SUSP Nasal Place 1 spray into the nose daily. 16 g 2  . GABAPENTIN 300 MG PO CAPS Oral Take 1 capsule (300 mg total) by mouth 2 (two) times daily. 60 capsule 2  . HYDROCHLOROTHIAZIDE 12.5 MG PO CAPS  take 1 capsule by mouth every morning 90 capsule 0  .  HYDROXYCHLOROQUINE SULFATE 200 MG PO TABS Oral Take 1 tablet (200 mg total) by mouth 2 (two) times daily. 60 tablet 2  . METOPROLOL TARTRATE 100 MG PO TABS Oral Take 1 tablet (100 mg total) by mouth 2 (two) times daily. 180 tablet 0  . ONE-DAILY MULTI VITAMINS PO TABS Oral Take 1 tablet by mouth daily.      [name] OMEPRAZOLE 20 MG PO  CPDR Oral Take 20 mg by mouth daily.      [name] PREDNISONE 10 MG PO TABS  Take two tabs by mouth twice daily for two days, then one tab twice daily for 2 days, then 1 tab daily for two days.  Take PC 14 tablet 0  . SIMVASTATIN 20 MG PO TABS Oral Take 1 tablet (20 mg total) by mouth at bedtime. 90 tablet 1    BP 158/91  Pulse 64  Temp(Src) 98.4 F (36.9 C) (Oral)  Resp 16  Ht 5' 1.5" (1.562 m)  Wt 244 lb (110.678 kg)  BMI 45.36 kg/m2  SpO2 97%  Physical Exam Nursing notes and Vital Signs reviewed. Appearance:  Patient appears stated age, and in no acute distress.  Patient is obese (BMI 45.5) Head:  No facial swelling noted. Eyes:  Pupils are equal, round, and reactive to light and accomodation.  Extraocular movement is intact.  Conjunctivae are not inflamed.  Fundi benign.  Ears:  Canals normal.  Tympanic membranes normal.  Nose:  Moderately congested turbinates.  No sinus tenderness.   Mouth:  No lesions  Pharynx:  Normal Neck:  Supple.  Slightly tender shotty anterior/posterior nodes are palpated bilaterally.  Carotids have normal upstrokes without bruits.  Lungs:  Clear to auscultation.  Breath sounds are equal.  Heart:  Regular rate and rhythm without murmurs, rubs, or gallops.  Extremities:  No edema.  No calf tenderness Skin:  No rash present.   Neurologic:  Cranial nerves 2 through 12 are normal.  Patellar reflexes are normal.  Cerebellar function is intact (finger-to-nose and rapid alternating hand movement).  Gait and station are normal.     ED Course  Procedures  none      1. Paresthesia face ? Etiology.  Unremarkable exam.  ? Early herpes zoster.   2.  Rhinitis    MDM  Begin tapering course of prednisone. Continue fluticasone nasal spray.  Call if rash develops. If symptoms become significantly worse during the night or over the weekend, proceed to the local emergency room. Followup with Family Doctor if not improved in one week.         Lattie Haw, MD 06/22/11 [id number]

## 2011-06-22 NOTE — ED Notes (Signed)
Patient c/o sinus pain, HA and drainage (bloody at times) x 5 days. Yesterday she used antibiotic ointment inside her nose and later developed numbness on the left side of her mouth which she still complains of. She has no numbness anywhere else, negative for facial droop or tongue deviation. She has used generic OTC allergy relief and fluticasone propionate spray.

## 2011-07-03 ENCOUNTER — Other Ambulatory Visit: Payer: Self-pay | Admitting: Family Medicine

## 2011-09-01 ENCOUNTER — Other Ambulatory Visit: Payer: Self-pay | Admitting: Family Medicine

## 2011-10-01 ENCOUNTER — Other Ambulatory Visit: Payer: Self-pay | Admitting: Family Medicine

## 2011-10-22 ENCOUNTER — Other Ambulatory Visit: Payer: Self-pay | Admitting: Family Medicine

## 2011-10-22 DIAGNOSIS — Z139 Encounter for screening, unspecified: Secondary | ICD-10-CM

## 2011-11-10 ENCOUNTER — Ambulatory Visit (INDEPENDENT_AMBULATORY_CARE_PROVIDER_SITE_OTHER): Payer: Medicare PPO

## 2011-11-10 DIAGNOSIS — Z1231 Encounter for screening mammogram for malignant neoplasm of breast: Secondary | ICD-10-CM

## 2011-11-10 DIAGNOSIS — Z139 Encounter for screening, unspecified: Secondary | ICD-10-CM

## 2011-11-16 ENCOUNTER — Emergency Department
Admission: EM | Admit: 2011-11-16 | Discharge: 2011-11-16 | Disposition: A | Discharge status: Home / Self Care | Payer: Medicare PPO | Source: Home / Self Care

## 2011-11-16 ENCOUNTER — Encounter: Payer: Self-pay | Admitting: Emergency Medicine

## 2011-11-16 DIAGNOSIS — H6090 Unspecified otitis externa, unspecified ear: Secondary | ICD-10-CM

## 2011-11-16 DIAGNOSIS — H6690 Otitis media, unspecified, unspecified ear: Secondary | ICD-10-CM

## 2011-11-16 MED ORDER — CIPROFLOXACIN-DEXAMETHASONE 0.3-0.1 % OT SUSP: 4.0000 [drp] | Freq: Two times a day (BID) | OTIC | Status: DC

## 2011-11-16 MED ORDER — AMOXICILLIN 500 MG PO CAPS: 1000.0000 mg | ORAL_CAPSULE | Freq: Three times a day (TID) | ORAL | Status: AC

## 2011-11-16 NOTE — ED Notes (Signed)
Reports bilateral ear pressure and aching x 1 week. No other S&S. Has not taken any of her prescribed meds today, nor OTCs.

## 2011-11-16 NOTE — ED Provider Notes (Signed)
History     CSN: [id number]  Arrival date & time 11/16/11  0941   None     Chief Complaint  Patient presents with  . Otalgia   HPI EAR PAIN Location:  R and L ear  Description: bilateral ear pain and irritation.  Onset:  5-7 days  Modifying factors: hx/o lupus   Symptoms  Sensation of fullness: mild Ear discharge: no URI symptoms: no  Fever: no Tinnitus:no   Dizziness:no   Hearing loss:no   Toothache: no Rashes or lesions: no Facial muscle weakness: no  Red Flags Recent trauma: no PMH prior ear surgery:  no Diabetes or Immunosuppresion: on plaquenil chronically     Past Medical History  Diagnosis Date  . Lupus (systemic lupus erythematosus)   . Hypertension   . Obesity   . Hyperlipidemia   . Osteoarthritis   . Osteoporosis   . Lupus   . Fibromyalgia     Past Surgical History  Procedure Date  . Great toe fusion     left  . Abdominal hysterectomy     partial  . Breast surgery     reduction  . Epicondylitis     left  . Carpal tunnel release     left  . Right elbow   . Talonavicular joint fusion     LT foot  . Surgisis biodesign fistula plug   . Fistulotomy   . Mri syr ortho spec     Dr. Virgie Dad    Family History  Problem Relation Age of Onset  . Cancer Mother   . Hyperlipidemia Mother   . Hypertension Mother   . Hyperlipidemia Father     History  Substance Use Topics  . Smoking status: Never Smoker   . Smokeless tobacco: Not on file  . Alcohol Use: No    OB History    Grav Para Term Preterm Abortions TAB SAB Ect Mult Living                  Review of Systems  All other systems reviewed and are negative.    Allergies  Erythromycin; Sulfonamide derivatives; and Sulfa antibiotics  Home Medications   Current Outpatient Rx  Name Route Sig Dispense Refill  . ACETAMINOPHEN ER 650 MG PO TBCR Oral Take 650 mg by mouth every 8 (eight) hours as needed.      . ALENDRONATE SODIUM 70 MG PO TABS  take 1 tablet by mouth every 7 days  WITH A FULL GLASS OF WATER ON AN EMPTY STOMACH 12 tablet 0  . ASPIRIN 81 MG PO TABS Oral Take 81 mg by mouth daily.      [name] FEXOFENADINE HCL 180 MG PO TABS Oral Take 1 tablet (180 mg total) by mouth daily. 30 tablet 6  . OMEGA-3 FATTY ACIDS 1000 MG PO CAPS Oral Take 1 g by mouth daily.      [name] FLUTICASONE PROPIONATE 50 MCG/ACT NA SUSP Nasal Place 1 spray into the nose daily. 16 g 2  . GABAPENTIN 300 MG PO CAPS Oral Take 1 capsule (300 mg total) by mouth 2 (two) times daily. 60 capsule 2  . HYDROCHLOROTHIAZIDE 12.5 MG PO CAPS  take 1 capsule by mouth every morning 90 capsule 0  . HYDROXYCHLOROQUINE SULFATE 200 MG PO TABS Oral Take 1 tablet (200 mg total) by mouth 2 (two) times daily. 60 tablet 2  . METOPROLOL TARTRATE 100 MG PO TABS  take 1 tablet by mouth twice a day 180 tablet 0  .  ONE-DAILY MULTI VITAMINS PO TABS Oral Take 1 tablet by mouth daily.      [name] OMEPRAZOLE 20 MG PO CPDR Oral Take 20 mg by mouth daily.      [name] PREDNISONE 10 MG PO TABS  Take two tabs by mouth twice daily for two days, then one tab twice daily for 2 days, then 1 tab daily for two days.  Take PC 14 tablet 0  . SIMVASTATIN 20 MG PO TABS  take 1 tablet by mouth at bedtime 90 tablet 1    BP 152/82  Pulse 64  Temp 97.9 F (36.6 C) (Oral)  Resp 16  Ht 5' 1.5" (1.562 m)  Wt 237 lb (107.502 kg)  BMI 44.06 kg/m2  SpO2 97%  Physical Exam  Constitutional: She appears well-developed and well-nourished.  HENT:  Head: Normocephalic and atraumatic.  Mouth/Throat: Oropharynx is clear and moist.       Bilateral ear canal erythema and tenderness to otoscopic evaluation.  Bilateral TM bulging.   Eyes: Conjunctivae normal are normal. Pupils are equal, round, and reactive to light.  Neck: Normal range of motion. Neck supple.  Cardiovascular: Normal rate and regular rhythm.   Pulmonary/Chest: Effort normal and breath sounds normal.  Abdominal: Soft. Bowel sounds are normal.  Musculoskeletal: Normal range of motion.    Lymphadenopathy:    She has no cervical adenopathy.  Neurological: She is alert.  Skin: Skin is warm.    ED Course  Procedures (including critical care time)  Labs Reviewed - No data to display No results found.   1. Otitis media   2. Otitis externa       MDM  Will clinically treat with amox and ciprodex.  Discussed infectious red flags for reevaluation.  Follow up with ENT if this recurs.      The patient and/or caregiver has been counseled thoroughly with regard to treatment plan and/or medications prescribed including dosage, schedule, interactions, rationale for use, and possible side effects and they verbalize understanding. Diagnoses and expected course of recovery discussed and will return if not improved as expected or if the condition worsens. Patient and/or caregiver verbalized understanding.             Doree Albee, MD 11/16/11 1041

## 2011-11-23 ENCOUNTER — Telehealth: Payer: Self-pay | Admitting: *Deleted

## 2011-12-07 ENCOUNTER — Emergency Department
Admission: EM | Admit: 2011-12-07 | Discharge: 2011-12-07 | Disposition: A | Discharge status: Home / Self Care | Payer: Medicare PPO | Source: Home / Self Care

## 2011-12-07 ENCOUNTER — Encounter: Payer: Self-pay | Admitting: Emergency Medicine

## 2011-12-07 DIAGNOSIS — J309 Allergic rhinitis, unspecified: Secondary | ICD-10-CM

## 2011-12-07 DIAGNOSIS — H6090 Unspecified otitis externa, unspecified ear: Secondary | ICD-10-CM

## 2011-12-07 DIAGNOSIS — H60399 Other infective otitis externa, unspecified ear: Secondary | ICD-10-CM

## 2011-12-07 MED ORDER — CIPROFLOXACIN-DEXAMETHASONE 0.3-0.1 % OT SUSP: 4.0000 [drp] | Freq: Two times a day (BID) | OTIC | Status: DC

## 2011-12-07 NOTE — ED Provider Notes (Signed)
History     CSN: [id number]  Arrival date & time 12/07/11  1113   First MD Initiated Contact with Patient 12/07/11 1151      Chief Complaint  Patient presents with  . Otalgia   HPI EAR PAIN Location:  Bilateral ears  Description: bilateral ear pain and discomfort Onset:  2-3 weeks Modifying factors: Pt recently seen approx 3 weeks ago with similar sxs. Was rxd amox and ciprodex. Pt states that she completed course of oral abx but never filled rx for ciprodex. Sxs somewhat improved with amox, but then returned over the last 2-3 weeks.    Symptoms  Sensation of fullness: no Ear discharge: no URI symptoms: allergic rhinitis-pt currently on flonase. Not taking antihistamine   Fever: no Tinnitus:no   Dizziness:no   Hearing loss:no   Toothache: no Rashes or lesions: no Facial muscle weakness: no  Red Flags Recent trauma: no PMH prior ear surgery:  no Diabetes or Immunosuppresion: yes; on plaquenil for lupus.     Past Medical History  Diagnosis Date  . Lupus (systemic lupus erythematosus)   . Hypertension   . Obesity   . Hyperlipidemia   . Osteoarthritis   . Osteoporosis   . Lupus   . Fibromyalgia     Past Surgical History  Procedure Date  . Great toe fusion     left  . Abdominal hysterectomy     partial  . Breast surgery     reduction  . Epicondylitis     left  . Carpal tunnel release     left  . Right elbow   . Talonavicular joint fusion     LT foot  . Surgisis biodesign fistula plug   . Fistulotomy   . Mri syr ortho spec     Dr. Virgie Dad    Family History  Problem Relation Age of Onset  . Cancer Mother   . Hyperlipidemia Mother   . Hypertension Mother   . Hyperlipidemia Father     History  Substance Use Topics  . Smoking status: Never Smoker   . Smokeless tobacco: Not on file  . Alcohol Use: No    OB History    Grav Para Term Preterm Abortions TAB SAB Ect Mult Living                  Review of Systems  All other systems reviewed  and are negative.    Allergies  Erythromycin; Sulfonamide derivatives; and Sulfa antibiotics  Home Medications   Current Outpatient Rx  Name Route Sig Dispense Refill  . ACETAMINOPHEN ER 650 MG PO TBCR Oral Take 650 mg by mouth every 8 (eight) hours as needed.      . ALENDRONATE SODIUM 70 MG PO TABS  take 1 tablet by mouth every 7 days WITH A FULL GLASS OF WATER ON AN EMPTY STOMACH 12 tablet 0  . ASPIRIN 81 MG PO TABS Oral Take 81 mg by mouth daily.      [name] CIPROFLOXACIN-DEXAMETHASONE 0.3-0.1 % OT SUSP Both Ears Place 4 drops into both ears 2 (two) times daily. 7.5 mL 0  . FEXOFENADINE HCL 180 MG PO TABS Oral Take 1 tablet (180 mg total) by mouth daily. 30 tablet 6  . OMEGA-3 FATTY ACIDS 1000 MG PO CAPS Oral Take 1 g by mouth daily.      [name] FLUTICASONE PROPIONATE 50 MCG/ACT NA SUSP Nasal Place 1 spray into the nose daily. 16 g 2  . GABAPENTIN 300 MG PO CAPS  Oral Take 1 capsule (300 mg total) by mouth 2 (two) times daily. 60 capsule 2  . HYDROCHLOROTHIAZIDE 12.5 MG PO CAPS  take 1 capsule by mouth every morning 90 capsule 0  . HYDROXYCHLOROQUINE SULFATE 200 MG PO TABS Oral Take 1 tablet (200 mg total) by mouth 2 (two) times daily. 60 tablet 2  . METOPROLOL TARTRATE 100 MG PO TABS  take 1 tablet by mouth twice a day 180 tablet 0  . ONE-DAILY MULTI VITAMINS PO TABS Oral Take 1 tablet by mouth daily.      [name] OMEPRAZOLE 20 MG PO CPDR Oral Take 20 mg by mouth daily.      [name] PREDNISONE 10 MG PO TABS  Take two tabs by mouth twice daily for two days, then one tab twice daily for 2 days, then 1 tab daily for two days.  Take PC 14 tablet 0  . SIMVASTATIN 20 MG PO TABS  take 1 tablet by mouth at bedtime 90 tablet 1    BP 139/81  Pulse 63  Temp 98.1 F (36.7 C) (Oral)  Resp 18  Ht 5' 1.5" (1.562 m)  Wt 242 lb (109.77 kg)  BMI 44.98 kg/m2  SpO2 98%  Physical Exam  Constitutional: She appears well-developed and well-nourished.  HENT:  Head: Normocephalic and atraumatic.       Bilateral ear  canal erythema and tenderness to otoscopic evaluation +nasal erythema, rhinorrhea bilaterally, + post oropharyngeal erythema    Eyes: Pupils are equal, round, and reactive to light.  Neck: Normal range of motion. Neck supple.  Cardiovascular: Normal rate, regular rhythm and normal heart sounds.   Pulmonary/Chest: Effort normal and breath sounds normal.  Abdominal: Bowel sounds are normal.  Musculoskeletal: Normal range of motion.  Lymphadenopathy:    She has no cervical adenopathy.  Neurological: She is alert.  Skin: Skin is warm.    ED Course  Procedures (including critical care time)  Labs Reviewed - No data to display No results found.   1. Otitis externa       MDM  Pt instructed to fill ciprodex.  Discussed with pt compliance with medication.  There is also likely component of allergic sxs. Discussed anthistamine use and flonase compliance.  Discussed with pt ENT follow up if sxs persist.     The patient and/or caregiver has been counseled thoroughly with regard to treatment plan and/or medications prescribed including dosage, schedule, interactions, rationale for use, and possible side effects and they verbalize understanding. Diagnoses and expected course of recovery discussed and will return if not improved as expected or if the condition worsens. Patient and/or caregiver verbalized understanding.             Doree Albee, MD 12/07/11 1312

## 2011-12-07 NOTE — ED Notes (Signed)
Bilateral ear pain, cough x 1 month was here 3 weeks ago, not better

## 2011-12-08 ENCOUNTER — Other Ambulatory Visit: Payer: Self-pay | Admitting: Family Medicine

## 2011-12-08 NOTE — Telephone Encounter (Signed)
Pt needs appt before future refills.

## 2011-12-21 ENCOUNTER — Ambulatory Visit (INDEPENDENT_AMBULATORY_CARE_PROVIDER_SITE_OTHER): Payer: Medicare PPO | Admitting: Family Medicine

## 2011-12-21 ENCOUNTER — Encounter: Payer: Self-pay | Admitting: Family Medicine

## 2011-12-21 ENCOUNTER — Ambulatory Visit (INDEPENDENT_AMBULATORY_CARE_PROVIDER_SITE_OTHER): Payer: Medicare PPO

## 2011-12-21 VITALS — BP 152/85 | HR 65 | Temp 98.2°F | Ht 61.0 in | Wt 244.0 lb

## 2011-12-21 DIAGNOSIS — R059 Cough, unspecified: Secondary | ICD-10-CM

## 2011-12-21 DIAGNOSIS — J309 Allergic rhinitis, unspecified: Secondary | ICD-10-CM

## 2011-12-21 DIAGNOSIS — J329 Chronic sinusitis, unspecified: Secondary | ICD-10-CM

## 2011-12-21 DIAGNOSIS — R05 Cough: Secondary | ICD-10-CM

## 2011-12-21 DIAGNOSIS — H9209 Otalgia, unspecified ear: Secondary | ICD-10-CM

## 2011-12-21 DIAGNOSIS — M810 Age-related osteoporosis without current pathological fracture: Secondary | ICD-10-CM

## 2011-12-21 MED ORDER — LEVOFLOXACIN 500 MG PO TABS: 500.0000 mg | ORAL_TABLET | Freq: Every day | ORAL | Status: AC

## 2011-12-21 MED ORDER — FLUTICASONE PROPIONATE 50 MCG/ACT NA SUSP: 2.0000 | Freq: Every day | NASAL | Status: DC

## 2011-12-21 NOTE — Patient Instructions (Signed)

## 2011-12-21 NOTE — Progress Notes (Signed)
Subjective:    Patient ID: Sharon Reilly, female    DOB: [birth year], 56 y.o.   MRN: [id number]  HPI  Still having bilataral ear pain. WAs tx for OM in Sept and then OE in October.  Finished the drops to days ago. Still painful. No fever.  Mild ST.  Has had a mostly nonproductive cough for 2 months. Had had a HA for 5 days.  No drainage.  Some post nasal drip.  She is on allergy medicine., oral antihistamine.  She is not on her nasal spray. She denies any shortness of breath or chest pain. Occasionally she gets a little bit of soreness after a coughing fit. No other worsening or alleviating symptoms.  HTN - no CP or SOB. Taking meds for her BP regularly but not today.   Osteoporosis - Thinks do for her DEXA.   Review of Systems BP 152/85  Pulse 65  Temp 98.2 F (36.8 C)  Ht 5\' 1"  (1.549 m)  Wt 244 lb (110.678 kg)  BMI 46.10 kg/m2    Allergies  Allergen Reactions  . Amoxicillin Itching    No rash.   . Erythromycin   . Sulfonamide Derivatives   . Sulfa Antibiotics Rash    Past Medical History  Diagnosis Date  . Lupus (systemic lupus erythematosus)   . Hypertension   . Obesity   . Hyperlipidemia   . Osteoarthritis   . Osteoporosis   . Lupus   . Fibromyalgia     Past Surgical History  Procedure Date  . Great toe fusion     left  . Abdominal hysterectomy     partial  . Breast surgery     reduction  . Epicondylitis     left  . Carpal tunnel release     left  . Right elbow   . Talonavicular joint fusion     LT foot  . Surgisis biodesign fistula plug   . Fistulotomy   . Mri syr ortho spec     Dr. Virgie Dad    History   Social History  . Marital Status: Single    Spouse Name: N/A    Number of Children: N/A  . Years of Education: N/A   Occupational History  . Not on file.   Social History Main Topics  . Smoking status: Never Smoker   . Smokeless tobacco: Not on file  . Alcohol Use: No  . Drug Use: No  . Sexually Active:    Other Topics Concern  .  Not on file   Social History Narrative  . No narrative on file    Family History  Problem Relation Age of Onset  . Cancer Mother   . Hyperlipidemia Mother   . Hypertension Mother   . Hyperlipidemia Father     Outpatient Encounter Prescriptions as of 12/21/2011  Medication Sig Dispense Refill  . aspirin 81 MG tablet Take 81 mg by mouth daily.        . fexofenadine (ALLEGRA) 180 MG tablet Take 1 tablet (180 mg total) by mouth daily.  30 tablet  6  . hydrochlorothiazide (MICROZIDE) 12.5 MG capsule take 1 capsule by mouth every morning  90 capsule  0  . metoprolol (LOPRESSOR) 100 MG tablet take 1 tablet by mouth twice a day  30 tablet  0  . omeprazole (PRILOSEC) 20 MG capsule Take 20 mg by mouth daily.        . simvastatin (ZOCOR) 20 MG tablet take 1 tablet by mouth  at bedtime  90 tablet  1  . acetaminophen (TYLENOL) 650 MG CR tablet Take 650 mg by mouth every 8 (eight) hours as needed.        [name] alendronate (FOSAMAX) 70 MG tablet take 1 tablet by mouth every 7 days WITH A FULL GLASS OF WATER ON AN EMPTY STOMACH  12 tablet  0  . ciprofloxacin-dexamethasone (CIPRODEX) otic suspension Place 4 drops into both ears 2 (two) times daily.  7.5 mL  0  . fish oil-omega-3 fatty acids 1000 MG capsule Take 1 g by mouth daily.        . fluticasone (FLONASE) 50 MCG/ACT nasal spray Place 2 sprays into the nose daily.  16 g  5  . gabapentin (NEURONTIN) 300 MG capsule Take 300 mg by mouth 2 (two) times daily.      . hydroxychloroquine (PLAQUENIL) 200 MG tablet Take 1 tablet (200 mg total) by mouth 2 (two) times daily.  60 tablet  2  . levofloxacin (LEVAQUIN) 500 MG tablet Take 1 tablet (500 mg total) by mouth daily.  10 tablet  0  . Multiple Vitamin (MULTIVITAMIN) tablet Take 1 tablet by mouth daily.        . predniSONE (DELTASONE) 10 MG tablet Take two tabs by mouth twice daily for two days, then one tab twice daily for 2 days, then 1 tab daily for two days.  Take PC  14 tablet  0  . [DISCONTINUED]  fluticasone (FLONASE) 50 MCG/ACT nasal spray Place 1 spray into the nose daily.  16 g  2  . [DISCONTINUED] fluticasone (FLONASE) 50 MCG/ACT nasal spray Place 1 spray into the nose daily.      . [DISCONTINUED] gabapentin (NEURONTIN) 300 MG capsule Take 1 capsule (300 mg total) by mouth 2 (two) times daily.  60 capsule  2          Objective:   Physical Exam  Constitutional: She is oriented to person, place, and time. She appears well-developed and well-nourished.  HENT:  Head: Normocephalic and atraumatic.  Right Ear: External ear normal.  Left Ear: External ear normal.  Nose: Nose normal.  Mouth/Throat: Oropharynx is clear and moist.       TMs and canals are clear. A few white speck debris along the base of the canals bilaterally. She has  A few white flecks on the TM bilaterally as well.   Eyes: Conjunctivae normal and EOM are normal. Pupils are equal, round, and reactive to light.  Neck: Neck supple. No thyromegaly present.  Cardiovascular: Normal rate, regular rhythm and normal heart sounds.   Pulmonary/Chest: Effort normal and breath sounds normal. She has no wheezes.  Lymphadenopathy:    She has no cervical adenopathy.  Neurological: She is alert and oriented to person, place, and time.  Skin: Skin is warm and dry.  Psychiatric: She has a normal mood and affect.          Assessment & Plan:  Bilatearl otalgia - actually think this is related to sinusitis. She has allergies as well. Continue over-the-counter oral antihistamine we will refill her nasal steroid spray. We'll also treat with Levaquin. She said she recently was treated with amoxicillin in September and started itching but she never had a rash. Thus him a little uncomfortable prescribing Augmentin at this time. Since she did complete a course of Cipro otic and still has some sign of otitis externa I recommend that she make an appt with ENT in the next couple weeks. I did  add amoxicillin to the intolerance  list.  Allergic rhinitis-continue over-the-counter oral antihistamine and restart nasal steroid spray. I do think that some of her allergies are making it difficult for her to get over her ear pain and sinus infection.  Sinusitis-will treat with Levaquin since had a potential allergy to reaction to amoxicillin in September. Call if not significantly better in one week.  Cough-greater than one month. Certainly could be related to ongoing sinusitis and allergies but I would like to get a chest x-ray at this point.  Hypertension-uncontrolled. She did not take her medications today. Asked to followup in one week for repeat blood pressure check  Flu vaccine given today.  Osteoporosis-we'll go ahead and reorder her DEXA scan. She is on her Fosamax regularly.

## 2011-12-29 ENCOUNTER — Ambulatory Visit (INDEPENDENT_AMBULATORY_CARE_PROVIDER_SITE_OTHER): Payer: Medicare PPO

## 2011-12-29 DIAGNOSIS — M810 Age-related osteoporosis without current pathological fracture: Secondary | ICD-10-CM

## 2011-12-29 DIAGNOSIS — M899 Disorder of bone, unspecified: Secondary | ICD-10-CM

## 2011-12-30 ENCOUNTER — Telehealth: Payer: Self-pay

## 2011-12-30 DIAGNOSIS — M329 Systemic lupus erythematosus, unspecified: Secondary | ICD-10-CM

## 2011-12-30 NOTE — Telephone Encounter (Signed)
Sharon Reilly would like a new referral to a local Rheumatologist. She has a history of Lupus. The last visit she had with Dr Virgel Manifold he told her he did not believe she has lupus. She would like a second opinion.

## 2011-12-30 NOTE — Telephone Encounter (Signed)
Referral placed.

## 2011-12-31 ENCOUNTER — Other Ambulatory Visit: Payer: Self-pay | Admitting: Family Medicine

## 2012-01-01 MED ORDER — ALENDRONATE SODIUM 70 MG PO TABS: 70.0000 mg | ORAL_TABLET | ORAL | Status: DC

## 2012-01-04 ENCOUNTER — Other Ambulatory Visit: Payer: Self-pay | Admitting: Family Medicine

## 2012-02-06 ENCOUNTER — Other Ambulatory Visit: Payer: Self-pay | Admitting: Family Medicine

## 2012-03-08 ENCOUNTER — Other Ambulatory Visit: Payer: Self-pay | Admitting: Family Medicine

## 2012-04-04 ENCOUNTER — Other Ambulatory Visit: Payer: Self-pay | Admitting: Family Medicine

## 2012-05-06 ENCOUNTER — Encounter: Payer: Self-pay | Admitting: Physician Assistant

## 2012-05-06 ENCOUNTER — Ambulatory Visit (INDEPENDENT_AMBULATORY_CARE_PROVIDER_SITE_OTHER): Payer: Medicare PPO | Admitting: Physician Assistant

## 2012-05-06 VITALS — BP 159/63 | HR 58 | Wt 242.0 lb

## 2012-05-06 DIAGNOSIS — K6289 Other specified diseases of anus and rectum: Secondary | ICD-10-CM

## 2012-05-06 DIAGNOSIS — Z8719 Personal history of other diseases of the digestive system: Secondary | ICD-10-CM

## 2012-05-06 LAB — HEMOCCULT GUIAC POC 1CARD (OFFICE)

## 2012-05-06 MED ORDER — TRAMADOL HCL 50 MG PO TABS: 50.0000 mg | ORAL_TABLET | Freq: Four times a day (QID) | ORAL | Status: DC | PRN

## 2012-05-06 NOTE — Progress Notes (Signed)
  Subjective:    Patient ID: Sharon Reilly, female    DOB: 21-Apr-1955, 57 y.o.   MRN: [id number]  HPI Pt presents to the clinic with rectal pain. She has a history of rectal abcesses. She has not had one since her move to [place] from [place]. Not seen GI here. For 3 days she has noticed rectal discomfort and pain. On pain scale is 5-7. Tried preparation H not helped. Denies any blood, mucus, or discharge. Last colonosopy 3 years ago. Does have hx of polyps being removed. Denies any fever, chills, sweats, swelling. Heating pad helps the most. Not taking anything for pain.    Review of Systems     Objective:   Physical Exam  Constitutional: She is oriented to person, place, and time. She appears well-developed and well-nourished.  Obese.  Cardiovascular: Normal rate, regular rhythm and normal heart sounds.   Genitourinary: Guaiac negative stool.  External exam was normal. No swelling or erythema. Internal exam soft mass found and 6 oclock.   Neurological: She is alert and oriented to person, place, and time.  Psychiatric: She has a normal mood and affect. Her behavior is normal.          Assessment & Plan:  Rectal pain- discussed with pt on exam nothing externally was seen. Does feel like there are some bulging soft masses with rectal examination this could be internal hemorrhoids vs abcess vs mass vs fissures. . No blood on exam in stool. No signs of infection systemically. Will get CBC today to make sure WBC is normal. Will refer to GI. Offered to give tramadol for pain and pt declined. Recommended temporary hemorrhoid treatment with sitz baths. Call with any signs of infection or changes.

## 2012-05-06 NOTE — Patient Instructions (Addendum)
Will call with CBC. Will refer to GI.  Will give tramadol for pain.  Warm sitz baths. Stool softener colace. Increase fiber. Call if running a fever, chills, signs of infection. Continue heat also add anti-inflammatory ibuprofen 400-600mg  up to three times a day.

## 2012-05-07 LAB — CBC WITH DIFFERENTIAL/PLATELET
Basophils Absolute: 0 10*3/uL (ref 0.0–0.1)
Eosinophils Relative: 6 % — ABNORMAL HIGH (ref 0–5)
HCT: 34.9 % — ABNORMAL LOW (ref 36.0–46.0)
Hemoglobin: 11.9 g/dL — ABNORMAL LOW (ref 12.0–15.0)
Lymphocytes Relative: 47 % — ABNORMAL HIGH (ref 12–46)
Lymphs Abs: 1.9 10*3/uL (ref 0.7–4.0)
MCV: 85.5 fL (ref 78.0–100.0)
Monocytes Absolute: 0.2 10*3/uL (ref 0.1–1.0)
Monocytes Relative: 6 % (ref 3–12)
Neutro Abs: 1.7 10*3/uL (ref 1.7–7.7)
RDW: 14.9 % (ref 11.5–15.5)
WBC: 4 10*3/uL (ref 4.0–10.5)

## 2012-05-12 ENCOUNTER — Other Ambulatory Visit: Payer: Self-pay | Admitting: Family Medicine

## 2012-05-17 ENCOUNTER — Encounter: Payer: Self-pay | Admitting: Family Medicine

## 2012-05-17 ENCOUNTER — Ambulatory Visit (INDEPENDENT_AMBULATORY_CARE_PROVIDER_SITE_OTHER): Payer: Medicare PPO | Admitting: Family Medicine

## 2012-05-17 VITALS — BP 166/69 | HR 59 | Ht 61.6 in | Wt 246.0 lb

## 2012-05-17 DIAGNOSIS — M542 Cervicalgia: Secondary | ICD-10-CM

## 2012-05-17 DIAGNOSIS — I10 Essential (primary) hypertension: Secondary | ICD-10-CM

## 2012-05-17 DIAGNOSIS — R51 Headache: Secondary | ICD-10-CM

## 2012-05-17 DIAGNOSIS — E785 Hyperlipidemia, unspecified: Secondary | ICD-10-CM

## 2012-05-17 LAB — LIPID PANEL
HDL: 40 mg/dL (ref >39)
LDL Cholesterol: 105 mg/dL — ABNORMAL HIGH (ref 0–99)
Total CHOL/HDL Ratio: 4.2 Ratio
Triglycerides: 103 mg/dL (ref <150)

## 2012-05-17 LAB — COMPLETE METABOLIC PANEL WITH GFR
ALT: 11 U/L (ref 0–35)
Alkaline Phosphatase: 77 U/L (ref 39–117)
Creat: 0.87 mg/dL (ref 0.50–1.10)
GFR, Est African American: 86 mL/min
Sodium: 141 mEq/L (ref 135–145)
Total Bilirubin: 0.3 mg/dL (ref 0.3–1.2)
Total Protein: 6.3 g/dL (ref 6.0–8.3)

## 2012-05-17 NOTE — Progress Notes (Signed)
  Subjective:    Patient ID: Sharon Reilly, female    DOB: 07/29/1955, 57 y.o.   MRN: [id number]  HPI HTN-  Pt denies chest pain, SOB, dizziness, or heart palpitations.  Taking meds as directed w/o problems.  Denies medication side effects.  Does have a HA today. Had one last night adn woke up with it today. Has been walking for exercise.   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  Uses Tylenol and Aleve and occ use heating pad.  Also uses a TENS units. Went to PT at one time.    Hyperlipidemia - doing well on statin with no myalgias.   Review of Systems     Objective:   Physical Exam  Constitutional: She is oriented to person, place, and time. She appears well-developed and well-nourished.  HENT:  Head: Normocephalic and atraumatic.  Cardiovascular: Normal rate, regular rhythm and normal heart sounds.   No carotid bruit  Pulmonary/Chest: Effort normal and breath sounds normal.  Neurological: She is alert and oriented to person, place, and time.  Skin: Skin is warm and dry.  Psychiatric: She has a normal mood and affect. Her behavior is normal.          Assessment & Plan:  HTN - Uncontrolled but just took meds and she is in pain today.  Recheck in one month.   Encourage to keep up with exercise and work on weight loss and diet. Today she's uncomfortable and is in a lot of pain with her neck and her headache. This may certainly be contributing to her elevated pressure. She also took an Aleve this morning.  Neck pain - Recommend start your streteches, TENS units.  Can use Aleve as well.  Offered trial of muscle relaxer but she declined. If not improving let me know.   Hyperlipidemia- Due to recheck lipids.  Lab slip given. We have you coordinate future labs with her rheumatologist.

## 2012-06-10 ENCOUNTER — Encounter: Payer: Self-pay | Admitting: Family Medicine

## 2012-06-10 ENCOUNTER — Other Ambulatory Visit: Payer: Self-pay | Admitting: Family Medicine

## 2012-06-10 ENCOUNTER — Ambulatory Visit (INDEPENDENT_AMBULATORY_CARE_PROVIDER_SITE_OTHER): Payer: Medicare PPO

## 2012-06-10 ENCOUNTER — Ambulatory Visit (INDEPENDENT_AMBULATORY_CARE_PROVIDER_SITE_OTHER): Payer: Medicare PPO | Admitting: Family Medicine

## 2012-06-10 VITALS — BP 163/85 | HR 58 | Wt 243.0 lb

## 2012-06-10 DIAGNOSIS — M25532 Pain in left wrist: Secondary | ICD-10-CM

## 2012-06-10 DIAGNOSIS — M25539 Pain in unspecified wrist: Secondary | ICD-10-CM

## 2012-06-10 DIAGNOSIS — W19XXXA Unspecified fall, initial encounter: Secondary | ICD-10-CM

## 2012-06-10 NOTE — Progress Notes (Signed)
CC: Sharon Reilly is a 57 y.o. female is here for left wrist pain   Subjective: HPI:  Patient complains of left wrist pain. This has been present ever since Saturday with acute onset after falling onto her left wrist mechanism unknown to her.  Since then she has had mild pain localized in the medial wrist that is worse with palpation or lifting light objects. At rest pain does not bother her but she was woken from sleep yesterday. No interventions as of yet. Pain is present on a daily basis. Described only as pain.  History of osteoporosis. Denies swelling, redness, nor warmth of any joints in the left wrist or hand. She notices if she squeezes her wrist extends shock sensations into her pinky finger and into her forearm.   Review Of Systems Outlined In HPI  Past Medical History  Diagnosis Date  . Lupus (systemic lupus erythematosus)   . Hypertension   . Obesity   . Hyperlipidemia   . Osteoarthritis   . Osteoporosis   . Lupus   . Fibromyalgia      Family History  Problem Relation Age of Onset  . Cancer Mother   . Hyperlipidemia Mother   . Hypertension Mother   . Hyperlipidemia Father      History  Substance Use Topics  . Smoking status: Never Smoker   . Smokeless tobacco: Not on file  . Alcohol Use: No     Objective: Filed Vitals:   06/10/12 1032  BP: 163/85  Pulse: 58   Vital signs reviewed. General: Alert and Oriented, No Acute Distress HEENT: Pupils equal, round, reactive to light. Conjunctivae clear.  External ears unremarkable.  Moist mucous membranes. Lungs: Clear and comfortable work of breathing, speaking in full sentences without accessory muscle use. Cardiac: Regular rate and rhythm.  Neuro: CN II-XII grossly intact, gait normal. Extremities: No peripheral edema.  Strong peripheral pulses. No swelling redness nor edema of the left wrist. She has full range of motion and strength in the left wrist but does appear uncomfortable with palpation of distal ulna  which reproduces her presenting pain. No pain with compression of radial and ulnar bones. No pain in anatomical snuff box nor pain with Finkelstein's test. Mental Status: No depression, anxiety, nor agitation. Logical though process. Skin: Warm and dry.  Assessment & Plan: Sharon Reilly was seen today for left wrist pain.  Diagnoses and associated orders for this visit:  Left wrist pain - DG Wrist Complete Left; Future  Other Orders - Cancel: hydrochlorothiazide (HYDRODIURIL) 25 MG tablet; Take 1 tablet (25 mg total) by mouth daily.    Left wrist pain: Given her history of osteoporosis with exquisite pain on palpation of ulna an x-ray was obtained.  Personal interpretation with the patient shows no avulsion nor bony abnormality at the site of her pain. Reassurance was provided and she was fitted with a wrist immobilizer to wear for the next week, may continue for a second week if not completely better. Patient is not interested in medication to control pain  Return in about 2 weeks (around 06/24/2012), or if symptoms worsen or fail to improve.

## 2012-06-13 ENCOUNTER — Other Ambulatory Visit: Payer: Self-pay | Admitting: *Deleted

## 2012-06-13 MED ORDER — METOPROLOL TARTRATE 100 MG PO TABS: 50.0000 mg | ORAL_TABLET | Freq: Two times a day (BID) | ORAL | Status: DC

## 2012-06-13 MED ORDER — METOPROLOL TARTRATE 100 MG PO TABS: 100.0000 mg | ORAL_TABLET | Freq: Two times a day (BID) | ORAL | Status: DC

## 2012-06-13 MED ORDER — HYDROCHLOROTHIAZIDE 12.5 MG PO CAPS: 12.5000 mg | ORAL_CAPSULE | ORAL | Status: DC

## 2012-06-14 ENCOUNTER — Ambulatory Visit: Payer: Medicare PPO | Admitting: Family Medicine

## 2012-07-11 ENCOUNTER — Other Ambulatory Visit: Payer: Self-pay | Admitting: Family Medicine

## 2012-07-12 ENCOUNTER — Emergency Department
Admission: EM | Admit: 2012-07-12 | Discharge: 2012-07-12 | Disposition: A | Discharge status: Home / Self Care | Payer: Medicare PPO | Source: Home / Self Care | Attending: Family Medicine | Admitting: Family Medicine

## 2012-07-12 ENCOUNTER — Encounter: Payer: Self-pay | Admitting: *Deleted

## 2012-07-12 DIAGNOSIS — H6093 Unspecified otitis externa, bilateral: Secondary | ICD-10-CM

## 2012-07-12 DIAGNOSIS — H60399 Other infective otitis externa, unspecified ear: Secondary | ICD-10-CM

## 2012-07-12 DIAGNOSIS — J069 Acute upper respiratory infection, unspecified: Secondary | ICD-10-CM

## 2012-07-12 MED ORDER — NEOMYCIN-POLYMYXIN-HC 3.5-10000-1 OT SOLN: 3.0000 [drp] | Freq: Four times a day (QID) | OTIC | Status: AC

## 2012-07-12 MED ORDER — METHYLPREDNISOLONE ACETATE 80 MG/ML IJ SUSP: 80.0000 mg | Freq: Once | INTRAMUSCULAR | Status: DC

## 2012-07-12 MED ORDER — HYDROCOD POLST-CHLORPHEN POLST 10-8 MG/5ML PO LQCR: 5.0000 mL | Freq: Two times a day (BID) | ORAL | Status: DC | PRN

## 2012-07-12 MED ORDER — DOXYCYCLINE HYCLATE 100 MG PO CAPS: 100.0000 mg | ORAL_CAPSULE | Freq: Two times a day (BID) | ORAL | Status: AC

## 2012-07-12 NOTE — ED Provider Notes (Signed)
History     CSN: [id number]  Arrival date & time 07/12/12  1023   First MD Initiated Contact with Patient 07/12/12 1025      Chief Complaint  Patient presents with  . Cough  . Laryngitis  . Otalgia    HPI URI Symptoms Onset: 1 week  Description:rhinorrhea, nasal congestion, post nasal drip, cough  Modifying factors:  Baseline lupus   Symptoms Nasal discharge: yes Fever: no Sore throat: yes Cough: yes Wheezing: mild Ear pain: yes GI symptoms: no Sick contacts: yes; brother with similar sxs  Red Flags  Stiff neck: no Dyspnea: no Rash: no Swallowing difficulty: no  Sinusitis Risk Factors Headache/face pain: no Double sickening: no tooth pain: no  Allergy Risk Factors Sneezing: yes Itchy scratchy throat: no Seasonal symptoms: no  Flu Risk Factors Headache: mild muscle aches: no severe fatigue: no    Past Medical History  Diagnosis Date  . Lupus (systemic lupus erythematosus)   . Hypertension   . Obesity   . Hyperlipidemia   . Osteoarthritis   . Osteoporosis   . Lupus   . Fibromyalgia     Past Surgical History  Procedure Laterality Date  . Great toe fusion      left  . Abdominal hysterectomy      partial  . Breast surgery      reduction  . Epicondylitis      left  . Carpal tunnel release      left  . Right elbow    . Talonavicular joint fusion      LT foot  . Surgisis biodesign fistula plug    . Fistulotomy    . Mri syr ortho spec      Dr. Virgie Dad    Family History  Problem Relation Age of Onset  . Cancer Mother   . Hyperlipidemia Mother   . Hypertension Mother   . Hyperlipidemia Father     History  Substance Use Topics  . Smoking status: Never Smoker   . Smokeless tobacco: Not on file  . Alcohol Use: No    OB History   Grav Para Term Preterm Abortions TAB SAB Ect Mult Living                  Review of Systems  All other systems reviewed and are negative.    Allergies  Amoxicillin; Erythromycin; Sulfonamide  derivatives; and Sulfa antibiotics  Home Medications   Current Outpatient Rx  Name  Route  Sig  Dispense  Refill  . alendronate (FOSAMAX) 70 MG tablet   Oral   Take 1 tablet (70 mg total) by mouth every 7 (seven) days. Take with a full glass of water on an empty stomach.   12 tablet   3   . aspirin 81 MG tablet   Oral   Take 81 mg by mouth daily.           . chlorpheniramine-HYDROcodone (TUSSIONEX PENNKINETIC ER) 10-8 MG/5ML LQCR   Oral   Take 5 mLs by mouth every 12 (twelve) hours as needed (cough).   60 mL   0   . doxycycline (VIBRAMYCIN) 100 MG capsule   Oral   Take 1 capsule (100 mg total) by mouth 2 (two) times daily.   14 capsule   0   . fexofenadine (ALLEGRA) 180 MG tablet   Oral   Take 1 tablet (180 mg total) by mouth daily.   30 tablet   6   . fluticasone (FLONASE) 50  MCG/ACT nasal spray   Nasal   Place 2 sprays into the nose daily.   16 g   5   . hydrochlorothiazide (MICROZIDE) 12.5 MG capsule   Oral   Take 1 capsule (12.5 mg total) by mouth every morning.   30 capsule   3   . metoprolol (LOPRESSOR) 100 MG tablet   Oral   Take 1 tablet (100 mg total) by mouth 2 (two) times daily.   60 tablet   0   . metoprolol (LOPRESSOR) 100 MG tablet      take 1 tablet by mouth twice a day   60 tablet   0   . neomycin-polymyxin-hydrocortisone (CORTISPORIN) otic solution   Right Ear   Place 3 drops into the right ear 4 (four) times daily.   10 mL   0   . omeprazole (PRILOSEC) 20 MG capsule   Oral   Take 20 mg by mouth daily.           . simvastatin (ZOCOR) 20 MG tablet      take 1 tablet by mouth at bedtime   90 tablet   1     BP 167/86  Pulse 66  Temp(Src) 97.6 F (36.4 C) (Oral)  Resp 18  Wt 245 lb (111.131 kg)  BMI 45.37 kg/m2  SpO2 99%  Physical Exam  Constitutional: She appears well-developed and well-nourished.  HENT:  Head: Normocephalic and atraumatic.  Bilateral ear canal erythema and tenderness to otoscopic evaluation    Eyes: Conjunctivae are normal. Pupils are equal, round, and reactive to light.  Neck: Normal range of motion. Neck supple.  Cardiovascular: Normal rate and regular rhythm.   Pulmonary/Chest: Effort normal. She has wheezes.  Abdominal: Soft.  Musculoskeletal: Normal range of motion.  Neurological: She is alert.  Skin: Skin is warm.    ED Course  Procedures (including critical care time)  Labs Reviewed - No data to display No results found.   1. URI (upper respiratory infection)   2. OE (otitis externa), bilateral       MDM  Will place on doxy for soft tissue coverage (PCN and macrolide allergy) Depomedrol 80mg  IM x1 given wheezing  Tussionex for cough  Discussed infectious and resp red flags at length.  Follow up as needed.     The patient and/or caregiver has been counseled thoroughly with regard to treatment plan and/or medications prescribed including dosage, schedule, interactions, rationale for use, and possible side effects and they verbalize understanding. Diagnoses and expected course of recovery discussed and will return if not improved as expected or if the condition worsens. Patient and/or caregiver verbalized understanding.             Doree Albee, MD 07/12/12 1056

## 2012-07-12 NOTE — ED Notes (Signed)
Pt c/o cough, bilateral ear ache, HA and sneezing x  3 days. Denies fever.

## 2012-07-15 ENCOUNTER — Encounter: Payer: Self-pay | Admitting: Family Medicine

## 2012-07-15 ENCOUNTER — Ambulatory Visit (INDEPENDENT_AMBULATORY_CARE_PROVIDER_SITE_OTHER): Payer: Medicare PPO

## 2012-07-15 ENCOUNTER — Ambulatory Visit (INDEPENDENT_AMBULATORY_CARE_PROVIDER_SITE_OTHER): Payer: Medicare PPO | Admitting: Family Medicine

## 2012-07-15 VITALS — BP 146/71 | HR 55 | Temp 97.6°F | Wt 245.0 lb

## 2012-07-15 DIAGNOSIS — R05 Cough: Secondary | ICD-10-CM

## 2012-07-15 DIAGNOSIS — J209 Acute bronchitis, unspecified: Secondary | ICD-10-CM

## 2012-07-15 DIAGNOSIS — R059 Cough, unspecified: Secondary | ICD-10-CM

## 2012-07-15 DIAGNOSIS — J040 Acute laryngitis: Secondary | ICD-10-CM

## 2012-07-15 DIAGNOSIS — R042 Hemoptysis: Secondary | ICD-10-CM

## 2012-07-15 MED ORDER — LEVOFLOXACIN 500 MG PO TABS: 500.0000 mg | ORAL_TABLET | Freq: Every day | ORAL | Status: DC

## 2012-07-15 MED ORDER — MOXIFLOXACIN HCL 400 MG PO TABS: 400.0000 mg | ORAL_TABLET | Freq: Every day | ORAL | Status: AC

## 2012-07-15 MED ORDER — METHYLPREDNISOLONE ACETATE 80 MG/ML IJ SUSP
80.0000 mg | Freq: Once | INTRAMUSCULAR | Status: AC
  Administered 2012-07-15: 80 mg via INTRAMUSCULAR

## 2012-07-15 NOTE — Addendum Note (Signed)
Addended by: Wyline Beady on: 07/15/2012 05:06 PM   Modules accepted: Orders

## 2012-07-15 NOTE — Progress Notes (Signed)
CC: Sharon Reilly is a 57 y.o. female is here for Laryngitis and Shortness of Breath   Subjective: HPI:  Patient complains of 7 days of worsening cough with loss of voice. Described as moderate in severity gradually worsening since onset. Slightly improved with hydrocodone cough syrup nothing particularly makes it worse. It is present all hours of the day and interfering with sleep. Cough is described as productive and she has noticed streaks of blood in her sputum the past few days. She has shortness of breath with exertion but not with rest nor orthopnea. 3 days ago she started doxycycline and 80 mg Depo-Medrol without any improvement of her symptoms.  She denies fevers, chills, chest pain, back pain, peripheral edema, motor sensory disturbances, rashes, nor new joint pain. Denies headaches, nor swollen lymph nodes    Review Of Systems Outlined In HPI  Past Medical History  Diagnosis Date  . Lupus (systemic lupus erythematosus)   . Hypertension   . Obesity   . Hyperlipidemia   . Osteoarthritis   . Osteoporosis   . Lupus   . Fibromyalgia      Family History  Problem Relation Age of Onset  . Cancer Mother   . Hyperlipidemia Mother   . Hypertension Mother   . Hyperlipidemia Father      History  Substance Use Topics  . Smoking status: Never Smoker   . Smokeless tobacco: Not on file  . Alcohol Use: No     Objective: Filed Vitals:   07/15/12 1559  BP: 146/71  Pulse: 55  Temp: 97.6 F (36.4 C)    General: Alert and Oriented, No Acute Distress HEENT: Pupils equal, round, reactive to light. Conjunctivae clear.  External ears unremarkable, canals clear with intact TMs with appropriate landmarks.  Middle ear with serous effusion bilaterally. Pink inferior turbinates.  Moist mucous membranes, pharynx without inflammation nor lesions.  Neck supple without palpable lymphadenopathy nor abnormal masses. Lungs: Trace end expiratory wheezing without rhonchi nor rales  Comfortable  work of breathing. Good air movement. Cardiac: Regular rate and rhythm. Normal S1/S2.  No murmurs, rubs, nor gallops.   Extremities: No peripheral edema.  Strong peripheral pulses.  Mental Status: No depression, anxiety, nor agitation. Skin: Warm and dry.  Assessment & Plan: Wallis was seen today for laryngitis and shortness of breath.  Diagnoses and associated orders for this visit:  Laryngitis  Acute bronchitis - levofloxacin (LEVAQUIN) 500 MG tablet; Take 1 tablet (500 mg total) by mouth daily. - moxifloxacin (AVELOX) 400 MG tablet; Take 1 tablet (400 mg total) by mouth daily.  Hemoptysis - DG Chest 2 View; Future    Due to recent blood and sputum chest x-ray was obtained personal interpretation shows no acute pulmonary process with no signs of pulmonary edema nor pleural effusions nor cavitations. Will follow radiology read. Stepup therapy for acute bronchitis: Start Levaquin daily if not on formulary patient was given a prescription for Avelox for price compare. Depo-Medrol 80 mg IM given today she would prefer not to take oral steroids.  Return if symptoms worsen or fail to improve.

## 2012-08-15 ENCOUNTER — Other Ambulatory Visit: Payer: Self-pay | Admitting: Physician Assistant

## 2012-09-01 ENCOUNTER — Encounter: Payer: Self-pay | Admitting: Family Medicine

## 2012-09-01 ENCOUNTER — Ambulatory Visit (INDEPENDENT_AMBULATORY_CARE_PROVIDER_SITE_OTHER): Payer: Medicare PPO | Admitting: Family Medicine

## 2012-09-01 VITALS — BP 144/72 | HR 62 | Ht 62.6 in | Wt 245.0 lb

## 2012-09-01 DIAGNOSIS — R7301 Impaired fasting glucose: Secondary | ICD-10-CM | POA: Insufficient documentation

## 2012-09-01 DIAGNOSIS — R21 Rash and other nonspecific skin eruption: Secondary | ICD-10-CM

## 2012-09-01 DIAGNOSIS — W57XXXA Bitten or stung by nonvenomous insect and other nonvenomous arthropods, initial encounter: Secondary | ICD-10-CM

## 2012-09-01 DIAGNOSIS — M79609 Pain in unspecified limb: Secondary | ICD-10-CM

## 2012-09-01 DIAGNOSIS — M79673 Pain in unspecified foot: Secondary | ICD-10-CM

## 2012-09-01 DIAGNOSIS — L299 Pruritus, unspecified: Secondary | ICD-10-CM

## 2012-09-01 MED ORDER — CEPHALEXIN 500 MG PO CAPS: 500.0000 mg | ORAL_CAPSULE | Freq: Three times a day (TID) | ORAL | Status: DC

## 2012-09-01 NOTE — Progress Notes (Signed)
Subjective:    Patient ID: Sharon Reilly, female    DOB: 12-13-1955, 57 y.o.   MRN: [id number]  HPI Ithcing and pain around the heel of her foot up her leg x 1-2 weeks since she began exercising on the Wi-FIT wearing only sock. she has been using cortisone to help with the itching. The itching and pain are worse during exercise and better at rest. Has tried cortisone and anti-itch cream.  She then noticed sores on her legs and wondered fi they are flee bites. She has been scratching at them.  HAs also been trying to put alcohol. She is worried she may have DM based on something she saw on TV.  Her brother has diabetes and she would like to be tested.  Review of Systems     BP 144/72  Pulse 62  Ht 5' 2.6" (1.59 m)  Wt 245 lb (111.131 kg)  BMI 43.96 kg/m2    Allergies  Allergen Reactions  . Amoxicillin Itching    No rash.   . Erythromycin   . Sulfonamide Derivatives   . Sulfa Antibiotics Rash    Past Medical History  Diagnosis Date  . Lupus (systemic lupus erythematosus)   . Hypertension   . Obesity   . Hyperlipidemia   . Osteoarthritis   . Osteoporosis   . Lupus   . Fibromyalgia     Past Surgical History  Procedure Laterality Date  . Great toe fusion      left  . Abdominal hysterectomy      partial  . Breast surgery      reduction  . Epicondylitis      left  . Carpal tunnel release      left  . Right elbow    . Talonavicular joint fusion      LT foot  . Surgisis biodesign fistula plug    . Fistulotomy    . Mri syr ortho spec      Dr. Virgie Dad    History   Social History  . Marital Status: Single    Spouse Name: N/A    Number of Children: N/A  . Years of Education: N/A   Occupational History  . Not on file.   Social History Main Topics  . Smoking status: Never Smoker   . Smokeless tobacco: Not on file  . Alcohol Use: No  . Drug Use: No  . Sexually Active:    Other Topics Concern  . Not on file   Social History Narrative  . No narrative on  file    Family History  Problem Relation Age of Onset  . Breast cancer Mother   . Hyperlipidemia Mother   . Hypertension Mother   . Hyperlipidemia Father   . Diabetes Brother   . Diabetes      aunt    Outpatient Encounter Prescriptions as of 09/01/2012  Medication Sig Dispense Refill  . alendronate (FOSAMAX) 70 MG tablet Take 1 tablet (70 mg total) by mouth every 7 (seven) days. Take with a full glass of water on an empty stomach.  12 tablet  3  . aspirin 81 MG tablet Take 81 mg by mouth daily.        . fexofenadine (ALLEGRA) 180 MG tablet Take 1 tablet (180 mg total) by mouth daily.  30 tablet  6  . fluticasone (FLONASE) 50 MCG/ACT nasal spray Place 2 sprays into the nose daily.  16 g  5  . hydrochlorothiazide (MICROZIDE) 12.5 MG capsule Take  1 capsule (12.5 mg total) by mouth every morning.  30 capsule  3  . metoprolol (LOPRESSOR) 100 MG tablet Take 1 tablet (100 mg total) by mouth 2 (two) times daily.  60 tablet  0  . omeprazole (PRILOSEC) 20 MG capsule Take 20 mg by mouth daily.        . simvastatin (ZOCOR) 20 MG tablet take 1 tablet by mouth at bedtime  90 tablet  1  . cephALEXin (KEFLEX) 500 MG capsule Take 1 capsule (500 mg total) by mouth 3 (three) times daily.  30 capsule  0  . [DISCONTINUED] chlorpheniramine-HYDROcodone (TUSSIONEX PENNKINETIC ER) 10-8 MG/5ML LQCR Take 5 mLs by mouth every 12 (twelve) hours as needed (cough).  60 mL  0  . [DISCONTINUED] levofloxacin (LEVAQUIN) 500 MG tablet Take 1 tablet (500 mg total) by mouth daily.  7 tablet  0  . [DISCONTINUED] metoprolol (LOPRESSOR) 100 MG tablet take 1 tablet by mouth twice a day  60 tablet  2   No facility-administered encounter medications on file as of 09/01/2012.       Objective:   Physical Exam  Constitutional: She appears well-developed and well-nourished.  HENT:  Head: Normocephalic and atraumatic.  Musculoskeletal: She exhibits no edema.  Skin: Skin is warm and dry. No rash noted.  No rash around the  heels or skin but skin scraping performed. She has several pink papules on her lower extremities with multiple excoriations.   Psychiatric: She has a normal mood and affect. Her behavior is normal.    Heels are nontender on exam today with no swelling or bruising or erythema or rash.      Assessment & Plan:  Heel pain-most likely contusion to the heel fat pad with exercise. She's been using the Wi_FIT and has been standing on the exercise board with only socks on her feet. Encouraged her to do exercises that allows her to wear cushioned tissues to help with the impact on her heels while she is exercising. She is nontender on exam today. If still not helping then let me now.  Consider plantar fascitis as well thought pain is really on side of heel instead of towards the arch.    Itchey - skin KOH performed. Will call with results.   Insect bites with mild cellulitis - avoid scratching. We'll treat with Keflex. Call if not improved and resolving within 5-7 days.  IFG - A1C is 6.0 today. Will need ot monitor. REcheck in 6 months. Work on cutting out concentrated sweets and carbs.

## 2012-09-02 ENCOUNTER — Other Ambulatory Visit: Payer: Self-pay | Admitting: Family Medicine

## 2012-09-02 LAB — KOH PREP: RESULT - KOH: NONE SEEN

## 2012-09-02 MED ORDER — TRIAMCINOLONE ACETONIDE 0.1 % EX CREA: TOPICAL_CREAM | Freq: Every day | CUTANEOUS | Status: DC

## 2012-10-14 ENCOUNTER — Other Ambulatory Visit: Payer: Self-pay | Admitting: Family Medicine

## 2012-10-28 ENCOUNTER — Other Ambulatory Visit: Payer: Self-pay | Admitting: Family Medicine

## 2012-11-18 ENCOUNTER — Other Ambulatory Visit: Payer: Self-pay | Admitting: Family Medicine

## 2012-11-28 ENCOUNTER — Other Ambulatory Visit: Payer: Self-pay | Admitting: Family Medicine

## 2012-11-28 DIAGNOSIS — Z139 Encounter for screening, unspecified: Secondary | ICD-10-CM

## 2012-11-29 ENCOUNTER — Ambulatory Visit (INDEPENDENT_AMBULATORY_CARE_PROVIDER_SITE_OTHER): Payer: Medicare PPO | Admitting: Family Medicine

## 2012-11-29 ENCOUNTER — Encounter: Payer: Self-pay | Admitting: Family Medicine

## 2012-11-29 ENCOUNTER — Ambulatory Visit (INDEPENDENT_AMBULATORY_CARE_PROVIDER_SITE_OTHER): Payer: Medicare PPO

## 2012-11-29 VITALS — BP 136/72 | HR 61 | Wt 245.0 lb

## 2012-11-29 DIAGNOSIS — Z23 Encounter for immunization: Secondary | ICD-10-CM

## 2012-11-29 DIAGNOSIS — Z1231 Encounter for screening mammogram for malignant neoplasm of breast: Secondary | ICD-10-CM

## 2012-11-29 DIAGNOSIS — I10 Essential (primary) hypertension: Secondary | ICD-10-CM

## 2012-11-29 DIAGNOSIS — Z139 Encounter for screening, unspecified: Secondary | ICD-10-CM

## 2012-11-29 NOTE — Progress Notes (Signed)
  Subjective:    Patient ID: Sharon Reilly, female    DOB: 1955-09-01, 57 y.o.   MRN: [id number]  HPI   HTN-  Pt denies chest pain, SOB, dizziness, or heart palpitations.  Taking meds as directed w/o problems.  Denies medication side effects.    Review of Systems     Objective:   Physical Exam  Constitutional: She is oriented to person, place, and time. She appears well-developed and well-nourished.  HENT:  Head: Normocephalic and atraumatic.  Cardiovascular: Normal rate, regular rhythm and normal heart sounds.   Pulmonary/Chest: Effort normal and breath sounds normal.  Neurological: She is alert and oriented to person, place, and time.  Skin: Skin is warm and dry.  Psychiatric: She has a normal mood and affect. Her behavior is normal.          Assessment & Plan:  HTN- Well controlled. F/u in 6 months. Labs UTD.    Screen mammogram is schecdule for today.   Flu vaccine given.

## 2012-12-17 ENCOUNTER — Other Ambulatory Visit: Payer: Self-pay | Admitting: Family Medicine

## 2013-01-11 ENCOUNTER — Encounter: Payer: Self-pay | Admitting: Emergency Medicine

## 2013-01-11 ENCOUNTER — Emergency Department
Admission: EM | Admit: 2013-01-11 | Discharge: 2013-01-11 | Disposition: A | Discharge status: Home / Self Care | Payer: Medicare PPO | Source: Home / Self Care | Attending: Family Medicine | Admitting: Family Medicine

## 2013-01-11 DIAGNOSIS — K089 Disorder of teeth and supporting structures, unspecified: Secondary | ICD-10-CM

## 2013-01-11 DIAGNOSIS — J069 Acute upper respiratory infection, unspecified: Secondary | ICD-10-CM

## 2013-01-11 DIAGNOSIS — K0889 Other specified disorders of teeth and supporting structures: Secondary | ICD-10-CM

## 2013-01-11 DIAGNOSIS — M26629 Arthralgia of temporomandibular joint, unspecified side: Secondary | ICD-10-CM

## 2013-01-11 MED ORDER — CEPHALEXIN 500 MG PO CAPS: 500.0000 mg | ORAL_CAPSULE | Freq: Four times a day (QID) | ORAL | Status: DC

## 2013-01-11 MED ORDER — HYDROCODONE-ACETAMINOPHEN 5-300 MG PO TABS: ORAL_TABLET | ORAL | Status: DC

## 2013-01-11 MED ORDER — BENZONATATE 200 MG PO CAPS: 200.0000 mg | ORAL_CAPSULE | Freq: Every day | ORAL | Status: DC

## 2013-01-11 NOTE — ED Provider Notes (Signed)
CSN: [id number]     Arrival date & time 01/11/13  1033 History   First MD Initiated Contact with Patient 01/11/13 1130     Chief Complaint  Patient presents with  . Facial Pain  . Dental Pain  . Otalgia      HPI Comments: Patient presents with 3 complaints. 1) She has had a chipped tooth in her right lower jaw for about a year.  Last week she visited a dentist (evaluation only without treatment).  Consequently, over the past several days she has developed increasing pain in the affected tooth. 2)  Over the past 2 to 3 days she has developed mild sore throat, sinus congestion, and mild non-productive cough worse at night. 3)  She has had a right earache for several days.  The history is provided by the patient.    Past Medical History  Diagnosis Date  . Lupus (systemic lupus erythematosus)   . Hypertension   . Obesity   . Hyperlipidemia   . Osteoarthritis   . Osteoporosis   . Lupus   . Fibromyalgia    Past Surgical History  Procedure Laterality Date  . Great toe fusion      left  . Abdominal hysterectomy      partial  . Breast surgery      reduction  . Epicondylitis      left  . Carpal tunnel release      left  . Right elbow    . Talonavicular joint fusion      LT foot  . Surgisis biodesign fistula plug    . Fistulotomy    . Mri syr ortho spec      Dr. Virgie Dad   Family History  Problem Relation Age of Onset  . Breast cancer Mother   . Hyperlipidemia Mother   . Hypertension Mother   . Hyperlipidemia Father   . Diabetes Brother   . Diabetes      aunt   History  Substance Use Topics  . Smoking status: Never Smoker   . Smokeless tobacco: Not on file  . Alcohol Use: No   OB History   Grav Para Term Preterm Abortions TAB SAB Ect Mult Living                 Review of Systems + sore throat + cough No pleuritic pain No wheezing + nasal congestion + post-nasal drainage No sinus pain/pressure No itchy/red eyes + right earache No hemoptysis No SOB No  fever/chills No nausea No vomiting No abdominal pain No diarrhea No urinary symptoms No skin rash + fatigue No myalgias + headache Used OTC meds without relief  Allergies  Amoxicillin; Erythromycin; Sulfonamide derivatives; and Sulfa antibiotics  Home Medications   Current Outpatient Rx  Name  Route  Sig  Dispense  Refill  . alendronate (FOSAMAX) 70 MG tablet   Oral   Take 1 tablet (70 mg total) by mouth every 7 (seven) days. Take with a full glass of water on an empty stomach.   12 tablet   3   . aspirin 81 MG tablet   Oral   Take 81 mg by mouth daily.           . benzonatate (TESSALON) 200 MG capsule   Oral   Take 1 capsule (200 mg total) by mouth at bedtime. Take as needed for cough   12 capsule   0   . cephALEXin (KEFLEX) 500 MG capsule   Oral   Take  1 capsule (500 mg total) by mouth 4 (four) times daily.   40 capsule   0   . fexofenadine (ALLEGRA) 180 MG tablet   Oral   Take 1 tablet (180 mg total) by mouth daily.   30 tablet   6   . fluticasone (FLONASE) 50 MCG/ACT nasal spray   Nasal   Place 2 sprays into the nose daily.   16 g   5   . hydrochlorothiazide (MICROZIDE) 12.5 MG capsule      take 1 capsule by mouth every morning   30 capsule   3   . Hydrocodone-Acetaminophen 5-300 MG TABS      Take one by mouth at bedtime as needed for pain   7 each   0   . metoprolol (LOPRESSOR) 100 MG tablet   Oral   Take 1 tablet (100 mg total) by mouth 2 (two) times daily.   60 tablet   0   . metoprolol (LOPRESSOR) 100 MG tablet      take 1 tablet by mouth twice a day   60 tablet   0   . omeprazole (PRILOSEC) 20 MG capsule   Oral   Take 20 mg by mouth daily.           . simvastatin (ZOCOR) 20 MG tablet      take 1 tablet by mouth at bedtime   90 tablet   0   . triamcinolone cream (KENALOG) 0.1 %   Topical   Apply topically daily.   30 g   0    BP 164/77  Pulse 57  Temp(Src) 97.7 F (36.5 C) (Oral)  Resp 16  Ht 5' 1.5" (1.562  m)  Wt 245 lb (111.131 kg)  BMI 45.55 kg/m2  SpO2 97% Physical Exam  Nursing note and vitals reviewed. Constitutional: She is oriented to person, place, and time. She appears well-developed and well-nourished. No distress.  Patient is obese (BMI 45.6)  HENT:  Head: Normocephalic.  Right Ear: External ear normal.  Left Ear: External ear normal.  Nose: Nose normal. No sinus tenderness.  Mouth/Throat: Oropharynx is clear and moist and mucous membranes are normal.    Canals and tympanic membranes are normal bilaterally.  There is distinct tenderness over the right  temporomandibular joint.  Palpation there recreates her pain.      There is distinct tenderness with tapping of tooth #30.  #30 and 31 have large fillings in place.  No gingival swelling.  Eyes: Conjunctivae and EOM are normal. Pupils are equal, round, and reactive to light.  Neck: Neck supple.  Shotty posterior nodes are palpated.    Cardiovascular: Normal heart sounds.   Pulmonary/Chest: Breath sounds normal.  Abdominal: There is no tenderness.  Musculoskeletal: She exhibits no edema.  Lymphadenopathy:    She has cervical adenopathy.  Neurological: She is alert and oriented to person, place, and time.  Skin: Skin is warm and dry. No rash noted.    ED Course  Procedures  none       MDM   1. Toothache   2. TMJ arthralgia   3. Acute upper respiratory infections of unspecified site; suspect viral URI     Begin Keflex. Prescription written for Benzonatate [facility]) to take at bedtime for night-time cough.  Continue plain Robitussin (guaifenesin) for cough and congestion.  Increase fluid intake, rest. May use Afrin nasal spray (or generic oxymetazoline) twice daily for about 5 days.  Also recommend using saline nasal spray several times  daily and saline nasal irrigation (AYR is a common brand) Stop all antihistamines for now, and other non-prescription cough/cold preparations. May increase Aleve to two tabs twice  daily. Apply ice pack to right TMJ several times daily. Followup with dentist as soon as possible.  Follow-up with family doctor if not improving about 10 days.Lattie Haw, MD 01/11/13 [phone]

## 2013-01-11 NOTE — ED Notes (Signed)
Reports lower left jaw/dental pain and pain in Left ear and sinus area x 2 days.

## 2013-01-18 ENCOUNTER — Other Ambulatory Visit: Payer: Self-pay | Admitting: Family Medicine

## 2013-02-01 ENCOUNTER — Other Ambulatory Visit: Payer: Self-pay | Admitting: Family Medicine

## 2013-02-18 ENCOUNTER — Other Ambulatory Visit: Payer: Self-pay | Admitting: Family Medicine

## 2013-03-23 ENCOUNTER — Other Ambulatory Visit: Payer: Self-pay | Admitting: Family Medicine

## 2013-03-31 ENCOUNTER — Other Ambulatory Visit: Payer: Self-pay | Admitting: Family Medicine

## 2013-04-26 ENCOUNTER — Other Ambulatory Visit: Payer: Self-pay | Admitting: Family Medicine

## 2013-05-14 ENCOUNTER — Other Ambulatory Visit: Payer: Self-pay | Admitting: Family Medicine

## 2013-05-25 ENCOUNTER — Telehealth: Payer: Self-pay | Admitting: *Deleted

## 2013-05-25 ENCOUNTER — Other Ambulatory Visit: Payer: Self-pay | Admitting: Family Medicine

## 2013-05-25 NOTE — Telephone Encounter (Signed)
Pt informed that we are only sending 2 weeks of Metoprolol to pharmacy due to needing appt.  Oscar La, LPN

## 2013-05-31 ENCOUNTER — Ambulatory Visit (INDEPENDENT_AMBULATORY_CARE_PROVIDER_SITE_OTHER): Payer: Medicare PPO | Admitting: Family Medicine

## 2013-05-31 ENCOUNTER — Encounter: Payer: Self-pay | Admitting: Family Medicine

## 2013-05-31 VITALS — BP 142/80 | HR 64 | Ht 61.6 in | Wt 245.0 lb

## 2013-05-31 DIAGNOSIS — I10 Essential (primary) hypertension: Secondary | ICD-10-CM

## 2013-05-31 DIAGNOSIS — E739 Lactose intolerance, unspecified: Secondary | ICD-10-CM

## 2013-05-31 DIAGNOSIS — E785 Hyperlipidemia, unspecified: Secondary | ICD-10-CM

## 2013-05-31 DIAGNOSIS — M810 Age-related osteoporosis without current pathological fracture: Secondary | ICD-10-CM

## 2013-05-31 DIAGNOSIS — L659 Nonscarring hair loss, unspecified: Secondary | ICD-10-CM

## 2013-05-31 LAB — LIPID PANEL
CHOL/HDL RATIO: 4.6 ratio
CHOLESTEROL: 188 mg/dL (ref 0–200)
HDL: 41 mg/dL (ref >39)
LDL Cholesterol: 131 mg/dL — ABNORMAL HIGH (ref 0–99)
Triglycerides: 80 mg/dL (ref <150)
VLDL: 16 mg/dL (ref 0–40)

## 2013-05-31 LAB — COMPLETE METABOLIC PANEL WITH GFR
ALBUMIN: 3.5 g/dL (ref 3.5–5.2)
ALK PHOS: 71 U/L (ref 39–117)
ALT: 12 U/L (ref 0–35)
AST: 19 U/L (ref 0–37)
BUN: 16 mg/dL (ref 6–23)
CALCIUM: 9.1 mg/dL (ref 8.4–10.5)
CHLORIDE: 105 meq/L (ref 96–112)
CO2: 28 meq/L (ref 19–32)
Creat: 0.93 mg/dL (ref 0.50–1.10)
GFR, EST AFRICAN AMERICAN: 79 mL/min
GFR, EST NON AFRICAN AMERICAN: 68 mL/min
GLUCOSE: 76 mg/dL (ref 70–99)
POTASSIUM: 3.8 meq/L (ref 3.5–5.3)
Sodium: 142 mEq/L (ref 135–145)
TOTAL PROTEIN: 6.6 g/dL (ref 6.0–8.3)
Total Bilirubin: 0.4 mg/dL (ref 0.2–1.2)

## 2013-05-31 LAB — TSH: TSH: 1.431 u[IU]/mL (ref 0.350–4.500)

## 2013-05-31 LAB — VITAMIN B12: VITAMIN B 12: 458 pg/mL (ref 211–911)

## 2013-05-31 MED ORDER — BETAMETHASONE VALERATE 0.12 % EX FOAM: 1.0000 "application " | Freq: Two times a day (BID) | CUTANEOUS | Status: DC

## 2013-05-31 NOTE — Progress Notes (Signed)
   Subjective:    Patient ID: Sharon Reilly, female    DOB: January 17, 1956, 58 y.o.   MRN: [id number]  HPI Osteoporosis - wants to know how long to take the fosamax.   Hypertension- Pt denies chest pain, SOB, dizziness, or heart palpitations.  Taking meds as directed w/o problems.  Denies medication side effects.  Started some exercise and working on low salt diet.   Hair loss - started noticing it for months . Parents both have hairloss.  Thinning in the stop. No acutal circular lesion. Scalp is very itchey.   Hyperlipidemia-tolerating statin well without any side effects or problems.  Review of Systems     Objective:   Physical Exam  Constitutional: She is oriented to person, place, and time. She appears well-developed and well-nourished.  HENT:  Head: Normocephalic and atraumatic.  Cardiovascular: Normal rate, regular rhythm and normal heart sounds.   Pulmonary/Chest: Effort normal and breath sounds normal.  Neurological: She is alert and oriented to person, place, and time.  Skin: Skin is warm and dry.  Scalp actually looks very healthy. Do not see any patches at her loss. No broken hairs. There are plenty of hair follicles that look nice and healthy. No irritation or inflammation redness of the scalp itself.  Psychiatric: She has a normal mood and affect. Her behavior is normal.          Assessment & Plan:  HTN - No maximally controlled. I would like ot add an ACEi but she wants to work on diet and weight loss. I will give her 3 months to do this and she will followup at that time. She's not well controlled then we'll need to make adjustments. Due for CMP today.  Hyperlipidemia-due to repeat lipids today.   Hair loss- clear etiology. The scalp actually looks very healthy and I don't see scarring of the hair follicles themselves. She's exerting some itching and burning sensation on the top of the scalp I would like to treat her with a topical steroid cream and see if this is  helpful. Also we'll check her thyroid and B12 level. Will try luxiq.   Osteoporosis-for now recommend continue Fosamax for least the next 5 years. At that time if things are stable with bone density then we can consider taking a holiday from the drug.

## 2013-06-05 ENCOUNTER — Telehealth: Payer: Self-pay | Admitting: Family Medicine

## 2013-06-05 MED ORDER — FLUOCINOLONE ACETONIDE 0.01 % EX SOLN: Freq: Two times a day (BID) | CUTANEOUS | Status: DC

## 2013-06-05 NOTE — Telephone Encounter (Signed)
Call pt: the steroid foam was not covered by insurance for you scalp. We tried to get authorization but it was declined.   Will send over a new rx.

## 2013-06-05 NOTE — Telephone Encounter (Signed)
Pt called and informed.Sharon Reilly

## 2013-06-13 ENCOUNTER — Other Ambulatory Visit: Payer: Self-pay | Admitting: Family Medicine

## 2013-06-15 ENCOUNTER — Other Ambulatory Visit: Payer: Self-pay | Admitting: *Deleted

## 2013-06-15 MED ORDER — METOPROLOL TARTRATE 100 MG PO TABS: ORAL_TABLET | ORAL | Status: DC

## 2013-08-21 ENCOUNTER — Other Ambulatory Visit: Payer: Self-pay | Admitting: Family Medicine

## 2013-09-05 ENCOUNTER — Ambulatory Visit: Payer: Medicare PPO | Admitting: Family Medicine

## 2013-09-05 ENCOUNTER — Encounter: Payer: Self-pay | Admitting: Family Medicine

## 2013-09-05 ENCOUNTER — Ambulatory Visit (INDEPENDENT_AMBULATORY_CARE_PROVIDER_SITE_OTHER): Payer: Medicare PPO | Admitting: Family Medicine

## 2013-09-05 VITALS — BP 130/82 | HR 61 | Ht 61.6 in | Wt 246.0 lb

## 2013-09-05 DIAGNOSIS — R7301 Impaired fasting glucose: Secondary | ICD-10-CM

## 2013-09-05 DIAGNOSIS — R0683 Snoring: Secondary | ICD-10-CM

## 2013-09-05 DIAGNOSIS — I10 Essential (primary) hypertension: Secondary | ICD-10-CM

## 2013-09-05 DIAGNOSIS — R0609 Other forms of dyspnea: Secondary | ICD-10-CM

## 2013-09-05 DIAGNOSIS — H109 Unspecified conjunctivitis: Secondary | ICD-10-CM

## 2013-09-05 DIAGNOSIS — R0989 Other specified symptoms and signs involving the circulatory and respiratory systems: Secondary | ICD-10-CM

## 2013-09-05 LAB — POCT GLYCOSYLATED HEMOGLOBIN (HGB A1C): HEMOGLOBIN A1C: 6

## 2013-09-05 MED ORDER — BACITRACIN-POLYMYXIN B 500-10000 UNIT/GM OP OINT: 1.0000 "application " | TOPICAL_OINTMENT | Freq: Two times a day (BID) | OPHTHALMIC | Status: DC

## 2013-09-05 MED ORDER — LISINOPRIL-HYDROCHLOROTHIAZIDE 10-12.5 MG PO TABS: 1.0000 | ORAL_TABLET | Freq: Every day | ORAL | Status: DC

## 2013-09-05 MED ORDER — OMEPRAZOLE 20 MG PO CPDR: 20.0000 mg | DELAYED_RELEASE_CAPSULE | Freq: Every day | ORAL | Status: DC

## 2013-09-05 NOTE — Progress Notes (Signed)
Subjective:    Patient ID: Sharon Reilly, female    DOB: May 06, 1955, 58 y.o.   MRN: [id number]  HPI Followup hypertension-at last office visit her blood pressures are not well controlled. We did discuss adding an agent such as an ACE inhibitor but she declined and really want to work on diet and exercise first. Today as a followup to see if she has reached her goal and to make adjustments as needed.says hasn't really been able to exercise.   Impaired fasting glucose-no increased thirst or urination. Lab Results  Component Value Date   HGBA1C 6.0 09/01/2012   Says waking up with her eyes red. Sleeps with her windows open. Has been crusting and sticky in the am. They  Are sore and ithcy at times.  Has been on all her allergy meds and nasal spray . Has had some vision changes. She did start using her nasal spray, Flonase a little bit more consistently over the last week and has gotten a little better relief with her eye symptoms. No fevers chills or sweats. It did not start with cold symptoms.  Review of Systems She does have 2 brothers with sleep apnea.  BP 130/82  Pulse 61  Ht 5' 1.6" (1.565 m)  Wt 246 lb (111.585 kg)  BMI 45.56 kg/m2    Allergies  Allergen Reactions  . Amoxicillin Itching    No rash.   . Erythromycin   . Sulfonamide Derivatives   . Sulfa Antibiotics Rash    Past Medical History  Diagnosis Date  . Lupus (systemic lupus erythematosus)   . Hypertension   . Obesity   . Hyperlipidemia   . Osteoarthritis   . Osteoporosis   . Lupus   . Fibromyalgia     Past Surgical History  Procedure Laterality Date  . Great toe fusion      left  . Abdominal hysterectomy      partial  . Breast surgery      reduction  . Epicondylitis      left  . Carpal tunnel release      left  . Right elbow    . Talonavicular joint fusion      LT foot  . Surgisis biodesign fistula plug    . Fistulotomy    . Mri syr ortho spec      Dr. Christen Butter    History   Social History   . Marital Status: Single    Spouse Name: N/A    Number of Children: N/A  . Years of Education: N/A   Occupational History  . Not on file.   Social History Main Topics  . Smoking status: Never Smoker   . Smokeless tobacco: Not on file  . Alcohol Use: No  . Drug Use: No  . Sexual Activity:    Other Topics Concern  . Not on file   Social History Narrative  . No narrative on file    Family History  Problem Relation Age of Onset  . Breast cancer Mother   . Hyperlipidemia Mother   . Hypertension Mother   . Hyperlipidemia Father   . Diabetes Brother   . Diabetes      aunt  . Kidney failure Brother   . Thyroid disease Sister     Outpatient Encounter Prescriptions as of 09/05/2013  Medication Sig  . alendronate (FOSAMAX) 70 MG tablet take 1 tablet by mouth EVERY 7 DAYS WITH A FULL GLASS OF WATER ON AN EMPTY STOMACH.  [name]  aspirin 81 MG tablet Take 81 mg by mouth daily.    . fexofenadine (ALLEGRA) 180 MG tablet Take 1 tablet (180 mg total) by mouth daily.  . fluticasone (FLONASE) 50 MCG/ACT nasal spray Place 2 sprays into the nose daily.  . metoprolol (LOPRESSOR) 100 MG tablet take 1 tablet by mouth twice a day  . Omega-3 Fatty Acids (FISH OIL) 1000 MG CAPS Take 2 g by mouth daily.  [name] omeprazole (PRILOSEC) 20 MG capsule Take 1 capsule (20 mg total) by mouth daily.  . simvastatin (ZOCOR) 20 MG tablet take 1 tablet by mouth at bedtime  . triamcinolone cream (KENALOG) 0.1 % Apply topically daily.  . [DISCONTINUED] hydrochlorothiazide (MICROZIDE) 12.5 MG capsule take 1 capsule by mouth every morning  . [DISCONTINUED] omeprazole (PRILOSEC) 20 MG capsule Take 20 mg by mouth daily.    . bacitracin-polymyxin b (POLYSPORIN) ophthalmic ointment Place 1 application into both eyes every 12 (twelve) hours. apply to eye every 12 hours while awake x 1 week.  [name] lisinopril-hydrochlorothiazide (PRINZIDE,ZESTORETIC) 10-12.5 MG per tablet Take 1 tablet by mouth daily.  . [DISCONTINUED] fluocinolone  (SYNALAR) 0.01 % external solution Apply topically 2 (two) times daily.          Objective:   Physical Exam  Constitutional: She is oriented to person, place, and time. She appears well-developed and well-nourished.  HENT:  Head: Normocephalic and atraumatic.  Right Ear: External ear normal.  Left Ear: External ear normal.  Nose: Nose normal.  Mouth/Throat: Oropharynx is clear and moist.  Eyes: EOM are normal. Pupils are equal, round, and reactive to light. Right eye exhibits no discharge. Left eye exhibits no discharge.  Injected sclera.   Neck: Neck supple.  Cardiovascular: Normal rate, regular rhythm and normal heart sounds.   Pulmonary/Chest: Effort normal and breath sounds normal.  Lymphadenopathy:    She has no cervical adenopathy.  Neurological: She is alert and oriented to person, place, and time.  Skin: Skin is warm and dry.  Psychiatric: She has a normal mood and affect. Her behavior is normal.          Assessment & Plan:  Hypertension-Uncontrolled.  Add an ACE in to her regimen. Followup in one month to recheck blood pressure make sure at goal. Since we will be adding an ACE inhibitor then will check BMP at followup.  Impaired fasting glucose- stable. Followup in 6 months for repeat A1c. Continue work on diet exercise and weight loss. She says she has back pain when she tries exercise I encouraged her to look into water aerobics et Ronney Asters. She doesn't drive so this makes it more difficult for her to get some regular exercise.  Bilateral conjunctivitis-will treat with ophthalmic eyedrops. We'll check vision today. If she's not improving then she will need to see her eye doctor. Her last eye exam was about a year ago. She reports that she did have some damage from the Plaquenil. She should be seen every 6 months anyway.  Snoring-STOP BANG questionnaire score of 6 today. Please see scan document. That makes her high risk for sleep apnea. We discussed need for for  further evaluation for testing and screening. She wants to check with her insurance company first. She would prefer to do an in-home sleep study if possible.

## 2013-09-05 NOTE — Progress Notes (Signed)
STOP BANG Patient is at high risk of OSA she answered yes to 3 or more items. She had 6 positive responses.Sharon Reilly, [name]

## 2013-09-06 ENCOUNTER — Telehealth: Payer: Self-pay | Admitting: *Deleted

## 2013-09-06 DIAGNOSIS — R0683 Snoring: Secondary | ICD-10-CM

## 2013-09-06 NOTE — Telephone Encounter (Signed)
Pt called and stated that she contacted her insurance company about the sleep study and she can have the sleep study done at home she will need the order placed for this.Sharon Reilly [place]

## 2013-09-07 ENCOUNTER — Other Ambulatory Visit: Payer: Self-pay | Admitting: Family Medicine

## 2013-09-07 DIAGNOSIS — R0683 Snoring: Secondary | ICD-10-CM

## 2013-09-07 NOTE — Telephone Encounter (Signed)
Order placed

## 2013-09-08 NOTE — Telephone Encounter (Signed)
Pt informed.Sharon Reilly  

## 2013-09-08 NOTE — Telephone Encounter (Signed)
Pt wanted to know if she is to continue taking the metoprolol at night. Please advise.Sharon Reilly [place]

## 2013-09-08 NOTE — Telephone Encounter (Signed)
Yes, continue metoprolol.

## 2013-09-08 NOTE — Telephone Encounter (Signed)
Pt informed that order has been placed.Sharon Reilly [place]

## 2013-09-26 ENCOUNTER — Encounter (HOSPITAL_BASED_OUTPATIENT_CLINIC_OR_DEPARTMENT_OTHER): Payer: Medicare PPO

## 2013-09-27 ENCOUNTER — Ambulatory Visit (HOSPITAL_BASED_OUTPATIENT_CLINIC_OR_DEPARTMENT_OTHER): Payer: Medicare PPO | Attending: Family Medicine | Admitting: *Deleted

## 2013-09-27 DIAGNOSIS — G4733 Obstructive sleep apnea (adult) (pediatric): Secondary | ICD-10-CM | POA: Insufficient documentation

## 2013-09-27 DIAGNOSIS — R0609 Other forms of dyspnea: Secondary | ICD-10-CM

## 2013-09-27 DIAGNOSIS — R0989 Other specified symptoms and signs involving the circulatory and respiratory systems: Secondary | ICD-10-CM

## 2013-09-30 DIAGNOSIS — R0989 Other specified symptoms and signs involving the circulatory and respiratory systems: Secondary | ICD-10-CM

## 2013-09-30 DIAGNOSIS — R0609 Other forms of dyspnea: Secondary | ICD-10-CM

## 2013-09-30 NOTE — Sleep Study (Signed)
    NAME: MURLINE WEIGEL DATE OF BIRTH:  [birth year] MEDICAL RECORD NUMBER [id number]  LOCATION: [facility]  PHYSICIAN: Donavin Audino D  DATE OF STUDY: 09/27/2013  SLEEP STUDY TYPE: Out of Center Sleep Test                REFERRING PHYSICIAN: Hali Marry, *  INDICATION FOR STUDY: Hypersomnia with sleep apnea  EPWORTH SLEEPINESS SCORE:   5/24  HEIGHT:   5 feet 2 inches WEIGHT:   246 pounds   BMI 45 NECK SIZE:   in.  MEDICATIONS: Charted for review  IMPRESSION:  Severe obstructive sleep apnea/hypopnea syndrome, AHI 50.7 per hour. 354 total events scored including 25 central apneas, 104 obstructive apneas, 20 mixed apneas, 205 hypopneas. All events were recorded either supine or prone. Snoring with oxygen desaturation to a nadir of 74% and mean saturation 93% on room air. Mean heart rate 52.9 per minute    RECOMMENDATION:  Scores in this range are usually addressed first with CPAP. If appropriate, this patient can be scheduled for a dedicated CPAP titration study through the sleep disorder Center.   Deneise Lever Diplomate, American Board of Sleep Medicine  ELECTRONICALLY SIGNED ON:  09/30/2013, 10:43 AM [place] PH: [phone]   FX: [phone] [place]

## 2013-10-04 ENCOUNTER — Encounter: Payer: Self-pay | Admitting: Family Medicine

## 2013-10-04 ENCOUNTER — Ambulatory Visit (INDEPENDENT_AMBULATORY_CARE_PROVIDER_SITE_OTHER): Payer: Medicare PPO | Admitting: Family Medicine

## 2013-10-04 VITALS — BP 138/82 | HR 59 | Ht 61.25 in | Wt 248.0 lb

## 2013-10-04 DIAGNOSIS — H01133 Eczematous dermatitis of right eye, unspecified eyelid: Secondary | ICD-10-CM

## 2013-10-04 DIAGNOSIS — I10 Essential (primary) hypertension: Secondary | ICD-10-CM

## 2013-10-04 DIAGNOSIS — H01139 Eczematous dermatitis of unspecified eye, unspecified eyelid: Secondary | ICD-10-CM

## 2013-10-04 DIAGNOSIS — Z23 Encounter for immunization: Secondary | ICD-10-CM

## 2013-10-04 DIAGNOSIS — G4733 Obstructive sleep apnea (adult) (pediatric): Secondary | ICD-10-CM

## 2013-10-04 MED ORDER — HYDROCORTISONE VALERATE 0.2 % EX CREA: 1.0000 "application " | TOPICAL_CREAM | Freq: Every day | CUTANEOUS | Status: DC

## 2013-10-04 MED ORDER — AMBULATORY NON FORMULARY MEDICATION: Status: DC

## 2013-10-04 NOTE — Progress Notes (Addendum)
   Subjective:    Patient ID: Sharon Reilly, female    DOB: 1955/07/29, 58 y.o.   MRN: [id number]  HPI Hypertension- Pt denies chest pain, SOB, dizziness, or heart palpitations.  Taking meds as directed w/o problems.  Denies medication side effects.  We added an ACEi at last OV.    Sleep apnea-she's also here today to review her sleep study results. She said that we should have received the results yesterday. She had the testing done last week.   She also feels like she is getting eczema on her upper eyelids just in the crease above her eye and below her eyebrow. She sometimes gets eczema on her arms and she says her scans been very irritated and itchy on her eyes. She wants to know what to do to get better. She denies any changes in soaps lotions etc.  She's also had some pain that radiates from her right mid buttock area. She's not sure if it's from her back or if it's because she is overweight. She thinks she might be developing some arthritis in her hip. Most the time she does not take medication for it. It is better with rest. Worse with activity especially if she crosses her legs over she will feel a pulling sensation. She denies any pain over the spine itself.  Review of Systems     Objective:   Physical Exam  Constitutional: She is oriented to person, place, and time. She appears well-developed and well-nourished.  HENT:  Head: Normocephalic and atraumatic.  Cardiovascular: Normal rate, regular rhythm and normal heart sounds.   Pulmonary/Chest: Effort normal and breath sounds normal.  Neurological: She is alert and oriented to person, place, and time.  Skin: Skin is warm and dry.  Psychiatric: She has a normal mood and affect. Her behavior is normal.   Nontender over the lumbar spine. Right hip with normal range of motion. No pain with internal or external rotation. She's it she is tender over the mid buttock area.       Assessment & Plan:  Flu shot given today.    Eyelid  eczema-will treat with low potency topical steroid. Is not improving in the next couple weeks please let me know. Can see how to properly apply it without actually getting it on the eye lid itself and to make sure acute out of the eye. Wash hands carefully after applying.  Suspect piriformis syndrome-will get handout to do home stretches on her own. She's not improving may consider formal physical therapy. That she urinates her co-pay is $50 for physical therapy and she feels like this is unaffordable for her.

## 2013-10-04 NOTE — Assessment & Plan Note (Signed)
New Diagnosis. Results reviewed with her today. Since she had a home sleep study we will need to get supplies sent out to her. We'll schedule for auto titration. We did get a family doctor one to 2 weeks to see where we need to set her CPAP. We can then set her CPAP and I will see her back in 6 weeks.

## 2013-10-04 NOTE — Assessment & Plan Note (Signed)
Well-controlled on current regimen. Followup in 6 months. Lab Results  Component Value Date   CHOL 188 05/31/2013   HDL 41 05/31/2013   LDLCALC 131* 05/31/2013   TRIG 80 05/31/2013   CHOLHDL 4.6 05/31/2013

## 2013-10-06 ENCOUNTER — Telehealth: Payer: Self-pay | Admitting: Family Medicine

## 2013-10-06 NOTE — Telephone Encounter (Signed)
Sent CPAP orders and notes to [place] with rep, Andee Poles

## 2013-10-07 ENCOUNTER — Other Ambulatory Visit: Payer: Self-pay | Admitting: Family Medicine

## 2013-10-17 ENCOUNTER — Other Ambulatory Visit: Payer: Self-pay | Admitting: *Deleted

## 2013-10-17 DIAGNOSIS — G4733 Obstructive sleep apnea (adult) (pediatric): Secondary | ICD-10-CM

## 2013-10-17 MED ORDER — AMBULATORY NON FORMULARY MEDICATION: Status: DC

## 2013-11-15 ENCOUNTER — Telehealth: Payer: Self-pay | Admitting: Family Medicine

## 2013-11-15 ENCOUNTER — Ambulatory Visit (INDEPENDENT_AMBULATORY_CARE_PROVIDER_SITE_OTHER): Payer: Medicare PPO | Admitting: Family Medicine

## 2013-11-15 ENCOUNTER — Encounter: Payer: Self-pay | Admitting: Family Medicine

## 2013-11-15 VITALS — BP 122/76 | HR 78 | Ht 61.25 in | Wt 237.0 lb

## 2013-11-15 DIAGNOSIS — Z9989 Dependence on other enabling machines and devices: Secondary | ICD-10-CM

## 2013-11-15 DIAGNOSIS — I10 Essential (primary) hypertension: Secondary | ICD-10-CM

## 2013-11-15 DIAGNOSIS — G4733 Obstructive sleep apnea (adult) (pediatric): Secondary | ICD-10-CM

## 2013-11-15 MED ORDER — HYDROCORTISONE VALERATE 0.2 % EX OINT: 1.0000 "application " | TOPICAL_OINTMENT | Freq: Every day | CUTANEOUS | Status: DC

## 2013-11-15 NOTE — Telephone Encounter (Signed)
Called [employer] spoke w/kathy and asked that they fax over a copy of her autopap. I was transferred to the documentation dept. Spoke W/Toni she will fax this over.Audelia Hives [place]

## 2013-11-15 NOTE — Telephone Encounter (Signed)
Please clal her DME company and have them send me download of her Autopap so that I can set her CPAP.

## 2013-11-15 NOTE — Progress Notes (Signed)
   Subjective:    Patient ID: Sharon Reilly, female    DOB: [birth year], 58 y.o.   MRN: [id number]  HPI Hypertension- Pt denies chest pain, SOB, dizziness, or heart palpitations.  Taking meds as directed w/o problems.  Denies medication side effects.  She is only on metoprolol  F/U OSA - she has sleep with every night but not sleeping as long.  Goes to bed at 11PM. Then wakes up to urinate and then has a hard time to go back to sleep with it on.  Getting about 5-6 hours.  She has lost 11 lbs.  Energy level has been good. No exercise right now. She is fasting for her church.     Review of Systems     Objective:   Physical Exam  Constitutional: She is oriented to person, place, and time. She appears well-developed and well-nourished.  HENT:  Head: Normocephalic and atraumatic.  Cardiovascular: Normal rate, regular rhythm and normal heart sounds.   Pulmonary/Chest: Effort normal and breath sounds normal.  Neurological: She is alert and oriented to person, place, and time.  Skin: Skin is warm and dry.  Psychiatric: She has a normal mood and affect. Her behavior is normal.      Assessment & Plan:  HTN - well controlled. F/U in 3 months. Hopefully as continues to lose weight we can decrease her medications.    OSA - doing well overall.  Continue to work on using the CPAP. We will call the DME company to get a download of her  autopap readings on her CPAP.That way we can set her CPAP machine.  F/U in 6 weeks once set to standard pressure and make sure doing well.   BMI 44 - has lost 11 lbs. This is fantastic. Keep up the good work.

## 2013-11-17 ENCOUNTER — Telehealth: Payer: Self-pay | Admitting: Family Medicine

## 2013-11-17 DIAGNOSIS — G4733 Obstructive sleep apnea (adult) (pediatric): Secondary | ICD-10-CM

## 2013-11-17 DIAGNOSIS — Z9989 Dependence on other enabling machines and devices: Secondary | ICD-10-CM

## 2013-11-17 MED ORDER — AMBULATORY NON FORMULARY MEDICATION: Status: DC

## 2013-11-17 NOTE — Telephone Encounter (Signed)
Please call pt: I got her download report. She has been a great job. She has worn the device for more than 4 hours 26/30 days. 2 of the days she wore it for less than 4 hours. It took her pressure of about 9.8 to correct 95% of her apneas. Thus we will call [employer] and have them remotely set her machine to 10 cm water pressure.

## 2013-11-22 NOTE — Telephone Encounter (Signed)
Faxed order

## 2013-11-27 ENCOUNTER — Encounter: Payer: Self-pay | Admitting: Family Medicine

## 2013-12-10 ENCOUNTER — Other Ambulatory Visit: Payer: Self-pay | Admitting: Family Medicine

## 2013-12-27 ENCOUNTER — Ambulatory Visit: Payer: Commercial Managed Care - HMO | Admitting: Family Medicine

## 2014-01-17 ENCOUNTER — Other Ambulatory Visit: Payer: Self-pay | Admitting: Family Medicine

## 2014-01-17 DIAGNOSIS — Z9289 Personal history of other medical treatment: Secondary | ICD-10-CM

## 2014-01-19 ENCOUNTER — Encounter: Payer: Self-pay | Admitting: Family Medicine

## 2014-01-19 ENCOUNTER — Ambulatory Visit (INDEPENDENT_AMBULATORY_CARE_PROVIDER_SITE_OTHER): Payer: Commercial Managed Care - HMO | Admitting: Family Medicine

## 2014-01-19 ENCOUNTER — Ambulatory Visit (INDEPENDENT_AMBULATORY_CARE_PROVIDER_SITE_OTHER): Payer: Commercial Managed Care - HMO

## 2014-01-19 VITALS — BP 134/82 | HR 68 | Wt 246.0 lb

## 2014-01-19 DIAGNOSIS — R0789 Other chest pain: Secondary | ICD-10-CM

## 2014-01-19 DIAGNOSIS — R059 Cough, unspecified: Secondary | ICD-10-CM

## 2014-01-19 DIAGNOSIS — R0602 Shortness of breath: Secondary | ICD-10-CM

## 2014-01-19 DIAGNOSIS — R05 Cough: Secondary | ICD-10-CM

## 2014-01-19 DIAGNOSIS — R600 Localized edema: Secondary | ICD-10-CM

## 2014-01-19 LAB — CBC WITH DIFFERENTIAL/PLATELET
BASOS ABS: 0 10*3/uL (ref 0.0–0.1)
BASOS PCT: 0 % (ref 0–1)
EOS ABS: 0 10*3/uL (ref 0.0–0.7)
Eosinophils Relative: 0 % (ref 0–5)
HEMATOCRIT: 30.2 % — AB (ref 36.0–46.0)
Hemoglobin: 10.1 g/dL — ABNORMAL LOW (ref 12.0–15.0)
Lymphocytes Relative: 43 % (ref 12–46)
Lymphs Abs: 1.8 10*3/uL (ref 0.7–4.0)
MCH: 28.9 pg (ref 26.0–34.0)
MCHC: 33.4 g/dL (ref 30.0–36.0)
MCV: 86.3 fL (ref 78.0–100.0)
MONOS PCT: 7 % (ref 3–12)
MPV: 9.7 fL (ref 9.4–12.4)
Monocytes Absolute: 0.3 10*3/uL (ref 0.1–1.0)
NEUTROS ABS: 2.2 10*3/uL (ref 1.7–7.7)
Neutrophils Relative %: 50 % (ref 43–77)
PLATELETS: 285 10*3/uL (ref 150–400)
RBC: 3.5 MIL/uL — ABNORMAL LOW (ref 3.87–5.11)
RDW: 14.1 % (ref 11.5–15.5)
WBC: 4.3 10*3/uL (ref 4.0–10.5)

## 2014-01-19 LAB — COMPLETE METABOLIC PANEL WITH GFR
ALBUMIN: 3.2 g/dL — AB (ref 3.5–5.2)
ALT: 14 U/L (ref 0–35)
AST: 17 U/L (ref 0–37)
Alkaline Phosphatase: 70 U/L (ref 39–117)
BUN: 21 mg/dL (ref 6–23)
CO2: 29 mEq/L (ref 19–32)
Calcium: 9.3 mg/dL (ref 8.4–10.5)
Chloride: 107 mEq/L (ref 96–112)
Creat: 0.74 mg/dL (ref 0.50–1.10)
GFR, Est African American: >89 mL/min
GLUCOSE: 91 mg/dL (ref 70–99)
Potassium: 4.1 mEq/L (ref 3.5–5.3)
Sodium: 142 mEq/L (ref 135–145)
Total Bilirubin: 0.3 mg/dL (ref 0.2–1.2)
Total Protein: 6.2 g/dL (ref 6.0–8.3)

## 2014-01-19 LAB — POCT URINALYSIS DIPSTICK
BILIRUBIN UA: NEGATIVE
Glucose, UA: NEGATIVE
Ketones, UA: NEGATIVE
Nitrite, UA: NEGATIVE
PH UA: 5.5
PROTEIN UA: NEGATIVE
RBC UA: NEGATIVE
Spec Grav, UA: 1.01
Urobilinogen, UA: 0.2

## 2014-01-19 MED ORDER — LISINOPRIL-HYDROCHLOROTHIAZIDE 20-25 MG PO TABS: 1.0000 | ORAL_TABLET | Freq: Every day | ORAL | Status: DC

## 2014-01-19 NOTE — Progress Notes (Signed)
   Subjective:    Patient ID: Sharon Reilly, female    DOB: 1956/01/04, 58 y.o.   MRN: [id number]  HPI  Bilateral leg swelling x 1 months. Says worse at the day goes on.  Worse when up and walking. She is on 12.5mg  of hctz.  She denies any diet changes or increases in salt intake. No new medications. No other specific symptoms except for short of breath as described below.  Has been more SOB and has been coughing since started he CPAP a month ago.  Chest feels tight when walks up a hill. No fever, chills or sweats. Cough is not productive. She describes is more dry in nature.   Review of Systems     Objective:   Physical Exam  Constitutional: She is oriented to person, place, and time. She appears well-developed and well-nourished.  Obese-  HENT:  Head: Normocephalic and atraumatic.  Cardiovascular: Normal rate, regular rhythm and normal heart sounds.   Pulmonary/Chest: Effort normal and breath sounds normal.  Musculoskeletal:  She has 1+ pitting edema around the ankles and the top of the foot. Dorsal pedal pulses are intact 2+ bilaterally. And she has trace pitting edema just below the knees bilaterally. It is fairly symmetric. She has multiple scabs and sores on her lower extremities.  Neurological: She is alert and oriented to person, place, and time.  Skin: Skin is warm and dry.  Psychiatric: She has a normal mood and affect. Her behavior is normal.          Assessment & Plan:  LE Swelling - unclear etiology at this point. None of her medications should be causing this on upon review. She denies any changes in diet or increase in salt intake. She has noticed some chest tightness and shortness of breath with activity. Certainly congestive heart failure is a consideration but overall she is low risk. No prior history of coronary artery disease. Also consider kidney problems. We will check a BUN and creatinine as well as urinalysis. EKG today shows rate of 62 bpm, normal sinus  rhythm with normal axis. No acute ST-T wave changes.  Shortest of breath- We'll also get a chest x-ray today for further evaluation. consider infection, versus congestive heart failure, versus viral illness.

## 2014-01-20 ENCOUNTER — Other Ambulatory Visit: Payer: Self-pay | Admitting: Family Medicine

## 2014-01-20 LAB — TSH: TSH: 0.009 u[IU]/mL — ABNORMAL LOW (ref 0.350–4.500)

## 2014-01-20 LAB — BRAIN NATRIURETIC PEPTIDE: BRAIN NATRIURETIC PEPTIDE: 195 pg/mL — AB (ref 0.0–100.0)

## 2014-01-22 ENCOUNTER — Telehealth: Payer: Self-pay | Admitting: *Deleted

## 2014-01-22 NOTE — Progress Notes (Signed)
Quick Note:  No fluid in the lungs or lung infection  ______

## 2014-01-22 NOTE — Telephone Encounter (Signed)
Pt lvm asking what dose of lisinopril is she supposed to be taking. I lvm informing her that she is to take the lisinopril-hctz 20-25.Sharon Reilly

## 2014-01-23 ENCOUNTER — Other Ambulatory Visit: Payer: Self-pay | Admitting: *Deleted

## 2014-01-23 ENCOUNTER — Other Ambulatory Visit: Payer: Self-pay | Admitting: Family Medicine

## 2014-01-23 DIAGNOSIS — R7989 Other specified abnormal findings of blood chemistry: Secondary | ICD-10-CM

## 2014-01-23 DIAGNOSIS — R0600 Dyspnea, unspecified: Secondary | ICD-10-CM

## 2014-01-24 ENCOUNTER — Ambulatory Visit (INDEPENDENT_AMBULATORY_CARE_PROVIDER_SITE_OTHER): Payer: Medicare PPO

## 2014-01-24 ENCOUNTER — Telehealth: Payer: Self-pay | Admitting: *Deleted

## 2014-01-24 ENCOUNTER — Other Ambulatory Visit: Payer: Self-pay | Admitting: *Deleted

## 2014-01-24 DIAGNOSIS — Z1231 Encounter for screening mammogram for malignant neoplasm of breast: Secondary | ICD-10-CM

## 2014-01-24 DIAGNOSIS — Z9289 Personal history of other medical treatment: Secondary | ICD-10-CM

## 2014-01-24 MED ORDER — LOSARTAN POTASSIUM-HCTZ 100-25 MG PO TABS
1.0000 | ORAL_TABLET | Freq: Every day | ORAL | Status: DC
Start: 2014-01-24 — End: 2014-07-28

## 2014-01-24 NOTE — Telephone Encounter (Signed)
Pt walked in and stated that the lisinopril-hctz causes her to cough and wanted to know if dr.metheney would give her something else.  Dr. Madilyn Fireman changed this to losartan-hctz 100-25. Pt also wanted to know if she should continue to take the metoprolol and was advised to continue taking this also. New rx sent pt voiced understanding.Sharon Reilly [place]

## 2014-01-25 LAB — THYROID ANTIBODIES
Thyroglobulin Ab: <1 IU/mL (ref <2)
Thyroperoxidase Ab SerPl-aCnc: 46 IU/mL — ABNORMAL HIGH (ref <9)

## 2014-01-25 LAB — T4, FREE: Free T4: 1.46 ng/dL (ref 0.80–1.80)

## 2014-01-25 LAB — TSH: TSH: 0.013 u[IU]/mL — ABNORMAL LOW (ref 0.350–4.500)

## 2014-01-25 LAB — T3, FREE: T3, Free: 3.5 pg/mL (ref 2.3–4.2)

## 2014-01-26 ENCOUNTER — Telehealth: Payer: Self-pay | Admitting: *Deleted

## 2014-01-26 ENCOUNTER — Other Ambulatory Visit: Payer: Self-pay | Admitting: Family Medicine

## 2014-01-26 DIAGNOSIS — R7989 Other specified abnormal findings of blood chemistry: Secondary | ICD-10-CM

## 2014-01-26 NOTE — Telephone Encounter (Signed)
[employer] approvals: Echo - [id number] US soft tissue head/neck - [id number]

## 2014-01-30 ENCOUNTER — Ambulatory Visit (INDEPENDENT_AMBULATORY_CARE_PROVIDER_SITE_OTHER): Payer: Commercial Managed Care - HMO

## 2014-01-30 DIAGNOSIS — E041 Nontoxic single thyroid nodule: Secondary | ICD-10-CM

## 2014-01-30 DIAGNOSIS — R7989 Other specified abnormal findings of blood chemistry: Secondary | ICD-10-CM

## 2014-01-31 ENCOUNTER — Ambulatory Visit (HOSPITAL_BASED_OUTPATIENT_CLINIC_OR_DEPARTMENT_OTHER): Payer: Commercial Managed Care - HMO

## 2014-02-01 ENCOUNTER — Ambulatory Visit (INDEPENDENT_AMBULATORY_CARE_PROVIDER_SITE_OTHER): Payer: Commercial Managed Care - HMO | Admitting: Family Medicine

## 2014-02-01 ENCOUNTER — Encounter: Payer: Self-pay | Admitting: Family Medicine

## 2014-02-01 VITALS — BP 124/68 | HR 69 | Temp 97.8°F | Wt 242.0 lb

## 2014-02-01 DIAGNOSIS — J209 Acute bronchitis, unspecified: Secondary | ICD-10-CM

## 2014-02-01 DIAGNOSIS — R946 Abnormal results of thyroid function studies: Secondary | ICD-10-CM

## 2014-02-01 DIAGNOSIS — R221 Localized swelling, mass and lump, neck: Secondary | ICD-10-CM

## 2014-02-01 DIAGNOSIS — E041 Nontoxic single thyroid nodule: Secondary | ICD-10-CM

## 2014-02-01 DIAGNOSIS — E060 Acute thyroiditis: Secondary | ICD-10-CM

## 2014-02-01 MED ORDER — HYDROCODONE-HOMATROPINE 5-1.5 MG/5ML PO SYRP: 5.0000 mL | ORAL_SOLUTION | Freq: Every evening | ORAL | Status: DC | PRN

## 2014-02-01 NOTE — Progress Notes (Signed)
Subjective:    Patient ID: Sharon Reilly, female    DOB: Dec 30, 1955, 58 y.o.   MRN: [id number]  HPI   Follow-up from 2 weeks ago. Patient came in the shortest of breath and lower chin the swelling and cough. At the time I was very concerned about acute congestive heart failure versus infection. She had a normal chest x-ray at that time. And blood work showed abnormal thyroid levels indicating possible hyper thyroidism Poteet 3 and T4 levels were normal. Went to UC several days later and dx with bronchitis. She said her cough had worsened and she was wheezing. Put on prednisone.  Says feels like there was a knot in her throat when she swallow but that is better She can hear the congestion in her chest.  Both ears hurt.  She was wheezing but that is better now. Given 3 nebsi in the ED.  Cough is much worse at night. Keeping her awake.   The LE swelling is much better. It seems to have resolved. Some of her shortness of breath has improved as well. Thyroid levels were abnormal and we had ordered an ultrasound. She would like to go over those results today. She has not scheduled the echocardiogram because of cost. She wants to wait about 2 weeks until January.  Review of Systems     Objective:   Physical Exam  Constitutional: She is oriented to person, place, and time. She appears well-developed and well-nourished.  HENT:  Head: Normocephalic and atraumatic.  Right Ear: External ear normal.  Left Ear: External ear normal.  Nose: Nose normal.  Mouth/Throat: Oropharynx is clear and moist.  TMs and canals are clear.   Eyes: Conjunctivae and EOM are normal. Pupils are equal, round, and reactive to light.  Neck: Neck supple. No thyromegaly present.  Cardiovascular: Normal rate, regular rhythm and normal heart sounds.   Pulmonary/Chest: Effort normal and breath sounds normal. She has no wheezes.  Lymphadenopathy:    She has no cervical adenopathy.  Neurological: She is alert and oriented to  person, place, and time.  Skin: Skin is warm and dry.  Psychiatric: She has a normal mood and affect.          Assessment & Plan:  Acute bronchitis- she is slowly improving on the prednisone and albuterol. Not currently on antibiotic. No fever etc. She is improving each day. Call or suddenly gets worse or develops a fever. Chest exam is normal today. She is coughing a lot better in the exam room. I did give her prescription for cough syrup to use at bedtime.  Acute thyroiditis - thyroid ultrasound was overall essentially normal. Her repeat thyroid level had come up a little bit and she's no longer is symptomatic. Her lower extremity edema and shortness of breath has improved. I suspect she probably has acute thyroiditis with her current upper respiratory illness. We'll repeat her thyroid levels in about 4 weeks to make sure that they are improving and resolving. If they are still low then will refer to endocrinology for further evaluation.  Thyroid nodule- [place] was normal except for 5 mm thyroid nodule. Review the results with her. Recommend repeat ultrasound in one year.  1.7 mm mass on the left side of the neck near the mandible-this was seen on ultrasound. They recommended CT for further evaluation. I do not palpate any distinct mass or swelling or hardness in that area. She would prefer to hold off on the scan until January. We can order  at that time.

## 2014-02-17 DIAGNOSIS — G4733 Obstructive sleep apnea (adult) (pediatric): Secondary | ICD-10-CM | POA: Diagnosis not present

## 2014-02-23 DIAGNOSIS — R946 Abnormal results of thyroid function studies: Secondary | ICD-10-CM | POA: Diagnosis not present

## 2014-02-24 LAB — T4, FREE: Free T4: 1.31 ng/dL (ref 0.80–1.80)

## 2014-02-24 LAB — TSH: TSH: <0.008 u[IU]/mL — ABNORMAL LOW (ref 0.350–4.500)

## 2014-02-24 LAB — T3, FREE: T3 FREE: 3.6 pg/mL (ref 2.3–4.2)

## 2014-02-26 ENCOUNTER — Other Ambulatory Visit: Payer: Self-pay | Admitting: Family Medicine

## 2014-02-26 DIAGNOSIS — E0590 Thyrotoxicosis, unspecified without thyrotoxic crisis or storm: Secondary | ICD-10-CM

## 2014-03-01 ENCOUNTER — Ambulatory Visit (INDEPENDENT_AMBULATORY_CARE_PROVIDER_SITE_OTHER): Payer: Commercial Managed Care - HMO | Admitting: Family Medicine

## 2014-03-01 ENCOUNTER — Telehealth: Payer: Self-pay | Admitting: *Deleted

## 2014-03-01 ENCOUNTER — Encounter: Payer: Self-pay | Admitting: Family Medicine

## 2014-03-01 VITALS — BP 130/78 | HR 64 | Ht 61.25 in | Wt 241.0 lb

## 2014-03-01 DIAGNOSIS — E041 Nontoxic single thyroid nodule: Secondary | ICD-10-CM | POA: Diagnosis not present

## 2014-03-01 DIAGNOSIS — G4733 Obstructive sleep apnea (adult) (pediatric): Secondary | ICD-10-CM

## 2014-03-01 DIAGNOSIS — M545 Low back pain, unspecified: Secondary | ICD-10-CM

## 2014-03-01 DIAGNOSIS — G8929 Other chronic pain: Secondary | ICD-10-CM | POA: Diagnosis not present

## 2014-03-01 DIAGNOSIS — I10 Essential (primary) hypertension: Secondary | ICD-10-CM | POA: Diagnosis not present

## 2014-03-01 MED ORDER — METOPROLOL TARTRATE 100 MG PO TABS: 100.0000 mg | ORAL_TABLET | Freq: Two times a day (BID) | ORAL | Status: DC

## 2014-03-01 NOTE — Progress Notes (Signed)
   Subjective:    Patient ID: Sharon Reilly, female    DOB: 09-Dec-1955, 59 y.o.   MRN: [id number]  HPI Hypertension- Pt denies chest pain, SOB, dizziness, or heart palpitations.  Taking meds as directed w/o problems.  Denies medication side effects.    Follow-up acute thyroiditis-her repeat labs on January 8 indicated a very suppressed TSH level. I put in referral to endocrinology.  Her back has really been bothering her lately. When her back really hurst then her legs swell.  Using Aleve for her back.  She gets really stiff in the morning. She can't afford to do therapy.    OSA - last night did 3 hours on her CPAP. It seems to cause congestion and coughing.  Has been sleeping on 3 pillows because when lays flat starts to cough.  NO SOB.   Review of Systems     Objective:   Physical Exam  Constitutional: She is oriented to person, place, and time. She appears well-developed and well-nourished.  HENT:  Head: Normocephalic and atraumatic.  Cardiovascular: Normal rate, regular rhythm and normal heart sounds.   Pulmonary/Chest: Effort normal and breath sounds normal.  Neurological: She is alert and oriented to person, place, and time.  Skin: Skin is warm and dry.  Psychiatric: She has a normal mood and affect. Her behavior is normal.          Assessment & Plan:  HTN - well controlled.  Continue current regimen. Follow-up in 6 months.  Acute thyroiditis- her TSH is still very supressed but free T3 and Free T4 are normal.    chronic low back and hip pain - Add tylenol rheumatoid arthritis at bedtime and then just use the Aleve in the morning. That way minimizes her pressure to high dose of either drug..    OSA - she's been wearing it for about 3-4 hours most nights.  Recommend that she start using her nasal steroid spray about an hour before bedtime. Encourage her to try this for at least 2 weeks to see if she feels like she is able to wear the CPAP more consistently. She also  requests notes be sent over to [place]. We will fax these over today.

## 2014-03-01 NOTE — Telephone Encounter (Signed)
Clinical notes were faxed to [facility] today and fax receipt confirmed.

## 2014-03-08 ENCOUNTER — Telehealth: Payer: Self-pay

## 2014-03-08 NOTE — Telephone Encounter (Signed)
Sharon Reilly called about a letter for insurance. I was unable to reach patient for more details.

## 2014-03-14 DIAGNOSIS — E0590 Thyrotoxicosis, unspecified without thyrotoxic crisis or storm: Secondary | ICD-10-CM | POA: Diagnosis not present

## 2014-03-17 ENCOUNTER — Other Ambulatory Visit: Payer: Self-pay | Admitting: Family Medicine

## 2014-03-19 NOTE — Telephone Encounter (Signed)
F/u labs due around april

## 2014-03-20 DIAGNOSIS — G4733 Obstructive sleep apnea (adult) (pediatric): Secondary | ICD-10-CM | POA: Diagnosis not present

## 2014-03-26 ENCOUNTER — Other Ambulatory Visit: Payer: Self-pay | Admitting: Family Medicine

## 2014-04-18 DIAGNOSIS — G4733 Obstructive sleep apnea (adult) (pediatric): Secondary | ICD-10-CM | POA: Diagnosis not present

## 2014-04-23 ENCOUNTER — Telehealth: Payer: Self-pay | Admitting: *Deleted

## 2014-04-23 ENCOUNTER — Ambulatory Visit (INDEPENDENT_AMBULATORY_CARE_PROVIDER_SITE_OTHER): Payer: Commercial Managed Care - HMO | Admitting: Family Medicine

## 2014-04-23 ENCOUNTER — Encounter: Payer: Self-pay | Admitting: Family Medicine

## 2014-04-23 VITALS — BP 138/76 | HR 58 | Wt 248.0 lb

## 2014-04-23 DIAGNOSIS — L98491 Non-pressure chronic ulcer of skin of other sites limited to breakdown of skin: Secondary | ICD-10-CM

## 2014-04-23 MED ORDER — DOXYCYCLINE HYCLATE 100 MG PO TABS: 100.0000 mg | ORAL_TABLET | Freq: Two times a day (BID) | ORAL | Status: DC

## 2014-04-23 MED ORDER — MUPIROCIN 2 % EX OINT: TOPICAL_OINTMENT | Freq: Two times a day (BID) | CUTANEOUS | Status: DC

## 2014-04-23 NOTE — Patient Instructions (Signed)
Start the oral antibiotic today.  Apply the mupirocin ointment twice a day for the next 3 days. Then switch to plain Vaseline twice a day. Apply to gauze.   call if he feel the wound looks worse or is getting larger.

## 2014-04-23 NOTE — Telephone Encounter (Signed)
The alternative to cover for MRSA is Bactrim which she is intolerant to because she has a sulfa allergy. Really this is her best option. If she can swing the $35 it would be best. If she just cannot please let me know.

## 2014-04-23 NOTE — Progress Notes (Signed)
   Subjective:    Patient ID: Sharon Reilly, female    DOB: 10/30/1955, 59 y.o.   MRN: [id number]  HPI noticed when taking shower she had a scab that came  About a week ago on her right breast.  Says it has been an open wound since and it has been oozing on and off.  She denies any fevers chills or sweats but says it is tender to touch. Her mammogram is up-to-date. She just had it 3 months ago in December. It was normal. She does have a family history of breast cancer.    Review of Systems     Objective:   Physical Exam   she has an exactly round 1 cm ulcer through this skin to the fat tissue on her breast. It's an exact circle. Just near that she has a little bit of skin that is excoriated. She said that is from the Band-Aid that she took off. She said actually ripped some of the skin off.      Assessment & Plan:  Right breast ulcer-I would like to treat this aggressively. I'm going to go ahead and put her on doxycycline to cover for MRSA and also have her apply mupirocin ointment twice a day for 3 days. After that she can switch to Vaseline. She already has an appointment with with me in one week. We will reexamine the lesion at that time. She will call me sooner if she feels like it's looking worse.

## 2014-04-23 NOTE — Telephone Encounter (Signed)
Called and lvm informing pt of recommendation.Sharon Reilly Sharon Reilly

## 2014-04-23 NOTE — Telephone Encounter (Signed)
Pt called and stated that she cannot afford the doxy it was $35 she is asking for something else to be sent.Sharon Reilly [place]

## 2014-04-23 NOTE — Addendum Note (Signed)
Addended by: Teddy Spike on: 04/23/2014 05:23 PM   Modules accepted: Orders

## 2014-04-26 LAB — WOUND CULTURE
GRAM STAIN: NONE SEEN
Gram Stain: NONE SEEN
Gram Stain: NONE SEEN

## 2014-04-30 ENCOUNTER — Ambulatory Visit: Payer: Commercial Managed Care - HMO | Admitting: Family Medicine

## 2014-05-04 ENCOUNTER — Encounter: Payer: Self-pay | Admitting: Family Medicine

## 2014-05-04 ENCOUNTER — Ambulatory Visit (INDEPENDENT_AMBULATORY_CARE_PROVIDER_SITE_OTHER): Payer: Medicare PPO | Admitting: Family Medicine

## 2014-05-04 VITALS — BP 158/69 | HR 68 | Wt 248.0 lb

## 2014-05-04 DIAGNOSIS — L98491 Non-pressure chronic ulcer of skin of other sites limited to breakdown of skin: Secondary | ICD-10-CM | POA: Diagnosis not present

## 2014-05-04 NOTE — Progress Notes (Signed)
   Subjective:    Patient ID: Sharon Reilly, female    DOB: 11-07-1955, 59 y.o.   MRN: [id number]  HPI Follow-up ulceration on the right breast. She does feel like it's starting to heal in getting a little bit smaller. She still has a little bit of serous drainage. She started the doxycycline on Monday. She was not able to get the perception until then but she has been on it for 5 days. The wound culture did grow out MRSA so should be sensitive to the doxycycline. The doxycycline does make her a little bit nauseated but otherwise is tolerating it okay.   Review of Systems     Objective:   Physical Exam  Constitutional: She appears well-developed and well-nourished.  HENT:  Head: Normocephalic and atraumatic.  Skin:  She has an approximately 7 x 8 mm circular ulceration file her on her right breast below the nipple. The depth is very shallow approximately 1 mm. This is smaller than 17 m measurement from a week ago.          Assessment & Plan:  Breast ulceration-measurements are little bit smaller today. Continue the doxycycline until you complete full course. Okay to take antibody with a little bit of food to try to reduce nausea. Also applied a piece of DuoDERM today and gave her instructions on how to use this product. She will remove it and replace in 3 days. I would like to see her back in one week to make sure that we are continuing to heal well. If she notices that the wound is getting a little bit more dry than she can stop using the DuoDERM.

## 2014-05-04 NOTE — Patient Instructions (Signed)
Try to leave the DuoDERM on for 3 days if you can. At that point you can remove it in the shower. Gently clean the skin with your fingertip. Do not scrub the area. Pat dry. Let air dry for 5-10 minutes, and then apply a new piece for the next 3 days. If it comes off sooner than 3 days and that is perfectly fine. Just cleanse gently, pat dry and apply a knee piece as above. Complete the antibiotics.

## 2014-05-10 ENCOUNTER — Ambulatory Visit (INDEPENDENT_AMBULATORY_CARE_PROVIDER_SITE_OTHER): Payer: Commercial Managed Care - HMO | Admitting: Family Medicine

## 2014-05-10 ENCOUNTER — Encounter: Payer: Self-pay | Admitting: Family Medicine

## 2014-05-10 VITALS — BP 120/70 | HR 74 | Ht 61.0 in | Wt 241.0 lb

## 2014-05-10 DIAGNOSIS — R21 Rash and other nonspecific skin eruption: Secondary | ICD-10-CM

## 2014-05-10 DIAGNOSIS — L98491 Non-pressure chronic ulcer of skin of other sites limited to breakdown of skin: Secondary | ICD-10-CM | POA: Diagnosis not present

## 2014-05-10 DIAGNOSIS — A4902 Methicillin resistant Staphylococcus aureus infection, unspecified site: Secondary | ICD-10-CM

## 2014-05-10 MED ORDER — TRIAMCINOLONE ACETONIDE 0.5 % EX OINT: 1.0000 "application " | TOPICAL_OINTMENT | Freq: Two times a day (BID) | CUTANEOUS | Status: DC

## 2014-05-10 NOTE — Progress Notes (Signed)
   Subjective:    Patient ID: Sharon Reilly, female    DOB: 1955-08-05, 59 y.o.   MRN: [id number]  HPI Follow-up wound on the right breast. She's on her she's on her last day of antibiotics today. She has been using the DuoDERM. She does feel like the wound is getting smaller. One culture was positive for MRSA and she's been treated with doxycycline.  Her leg have been intensely itchey for months. Says has ben scartching a lot. Using suave lotion and has been using it for years. No new soaps, perfumes, fabric softners, etc. she said she actually got bitten by fleas years ago and it left hyperpigmentation scars on her lower extremities. She says ever then she says it will flare from time to time and she'll actually get bumps over the scar tissue and will get extremely itchy. The rash never spreads to any other location and always stays on the lower extremities. It typically resolves eventually on its own. Says it is intensely itchey.  No problems with her bowels.    Review of Systems     Objective:   Physical Exam  Musculoskeletal:  6 x 6 cm on the right lower breast.    Skin:  6 x 6 cm on the right lower breast.  Has a good ring of granulation tissue and looks clean and intact. On her lower extremity is below the knees she has multiple hyperpigmented papular lesions. She has a few that are excoriated in the center.          Assessment & Plan:  Right breast lesion is continuing to get smaller which is fantastic. She will complete her antibiotic today. Recommend continue with the DuoDERM for now. I would like to see her back in one week to make sure that is continuing to heal. If any point has not been going to refer her to the breast center. Her mammogram is up-to-date. Next  Rash on lower extremities-unclear etiology. The only change in her recent medications would be the antibiotic but I do not feel like this is a drug rash. Recommend a trial of topical steroid cream.. Call if not getting  some relief. Next  MRSA-discussed the importance of using good hygiene, keeping the wound covered to avoid spread, cleaning around the fingernails very well and even keeping them shorter would be helpful.

## 2014-05-14 DIAGNOSIS — E0590 Thyrotoxicosis, unspecified without thyrotoxic crisis or storm: Secondary | ICD-10-CM | POA: Diagnosis not present

## 2014-05-18 ENCOUNTER — Telehealth: Payer: Self-pay | Admitting: Family Medicine

## 2014-05-18 ENCOUNTER — Ambulatory Visit (INDEPENDENT_AMBULATORY_CARE_PROVIDER_SITE_OTHER): Payer: Commercial Managed Care - HMO | Admitting: Family Medicine

## 2014-05-18 VITALS — BP 138/66 | HR 63 | Temp 98.0°F | Resp 20 | Ht 61.0 in | Wt 248.0 lb

## 2014-05-18 DIAGNOSIS — L98491 Non-pressure chronic ulcer of skin of other sites limited to breakdown of skin: Secondary | ICD-10-CM

## 2014-05-18 DIAGNOSIS — G4733 Obstructive sleep apnea (adult) (pediatric): Secondary | ICD-10-CM

## 2014-05-18 NOTE — Telephone Encounter (Signed)
Office note faxed.

## 2014-05-18 NOTE — Progress Notes (Signed)
Patient came into clinic for a nurse visit wound check on her right breast. Wound was checked by Dr. Madilyn Fireman. Pt asked to leave the area open to air during the day while she was at home. Advised this was OK but to apply some Vaseline to the area TID to keep it from getting dry and cracking. Advised to return for re-check in one week.  Patient reports she has been using her CPAP machine nightly, set on 10cm water pressure. Patient reports it makes she "doesn't sleep without it."

## 2014-05-18 NOTE — Telephone Encounter (Signed)
Per Huey Romans, the clinical notes stating that Pt need her C-Pap machine need to be faxed to [phone]. There is no form to be filled out, just send the clinical office note.

## 2014-05-18 NOTE — Progress Notes (Signed)
   Subjective:    Patient ID: Sharon Reilly, female    DOB: 1955/09/14, 59 y.o.   MRN: [id number]  HPI    Review of Systems     Objective:   Physical Exam        Assessment & Plan:     Need to call her home health to see what information they may specifically need. We may just be able to fax over today's note.

## 2014-05-19 DIAGNOSIS — G4733 Obstructive sleep apnea (adult) (pediatric): Secondary | ICD-10-CM | POA: Diagnosis not present

## 2014-05-25 ENCOUNTER — Ambulatory Visit (INDEPENDENT_AMBULATORY_CARE_PROVIDER_SITE_OTHER): Payer: Commercial Managed Care - HMO | Admitting: Physician Assistant

## 2014-05-25 VITALS — BP 130/55 | HR 72 | Temp 97.6°F | Ht 61.5 in | Wt 247.0 lb

## 2014-05-25 DIAGNOSIS — L98491 Non-pressure chronic ulcer of skin of other sites limited to breakdown of skin: Secondary | ICD-10-CM

## 2014-05-25 NOTE — Progress Notes (Signed)
   Subjective:    Patient ID: Sharon Reilly, female    DOB: 1955-03-03, 58 y.o.   MRN: [id number]  HPI Patient came into clinic for a nurse visit wound check on her right breast. Wound was checked by [facility]. Pt states she has been leaving the area open to air during the day while she was at home and applying some Vaseline to the area to keep it from getting dry and cracking.   Review of Systems     Objective:   Physical Exam        Assessment & Plan:  Wound was measured today, 1/2cm X 1/2cm. Much decreased from original visit of 6cm by 6cm. Patient advised to continue her same treatment plan of washing area with warm soapy water, keeping the area clean and dry, leaving open to air while at home and applying Vaseline throughout the day to prevent dryness/cracking. Advised to return for re-check in two weeks.

## 2014-06-13 ENCOUNTER — Ambulatory Visit (INDEPENDENT_AMBULATORY_CARE_PROVIDER_SITE_OTHER): Payer: Commercial Managed Care - HMO | Admitting: Family Medicine

## 2014-06-13 VITALS — BP 140/62 | HR 66 | Wt 251.0 lb

## 2014-06-13 DIAGNOSIS — L98491 Non-pressure chronic ulcer of skin of other sites limited to breakdown of skin: Secondary | ICD-10-CM | POA: Diagnosis not present

## 2014-06-13 NOTE — Progress Notes (Signed)
Patient ID: Sharon Reilly, female   DOB: May 22, 1955, 59 y.o.   MRN: [id number]  Patient here today for R breast wound check. The wound looks good. Healing well. Today measured  @ 1 cm --- approximately dime size. Patient's only concern is that she Ready for it to be completely healed. And that although she isn't wearing bra's that it seems to hurt from the inside. And she keeping Vaseline on it as instructed she doesn't cover with any bandages because she doesn't want a scab  & then for it to get pulled off removing bandages. She stated that sometimes a little clear drainage does come out of the wound  & wonder since she not wearing bra's is it ok for the wound drainage to be touching her skin under neath since her breast do hang? I did give her some guaze to put up under breast so that if it does drain nothing would get on her skin.

## 2014-06-13 NOTE — Progress Notes (Signed)
Please call patient: Since the lesion is still not completely healed would like to refer her to either the breast center in [place] or the breast clinic in [place]. Please let me know if she has a preference. Ok to wear bra

## 2014-06-18 DIAGNOSIS — G4733 Obstructive sleep apnea (adult) (pediatric): Secondary | ICD-10-CM | POA: Diagnosis not present

## 2014-06-26 ENCOUNTER — Telehealth: Payer: Self-pay | Admitting: *Deleted

## 2014-06-26 ENCOUNTER — Other Ambulatory Visit: Payer: Self-pay | Admitting: Family Medicine

## 2014-06-26 NOTE — Addendum Note (Signed)
Addended by: Beatrice Lecher D on: 06/26/2014 01:05 PM   Modules accepted: Orders

## 2014-06-26 NOTE — Progress Notes (Signed)
   Subjective:    Patient ID: Sharon Reilly, female    DOB: [birth year], 59 y.o.   MRN: [id number]  HPI    Review of Systems     Objective:   Physical Exam        Assessment & Plan:  Will refer to wound care instead.

## 2014-06-26 NOTE — Telephone Encounter (Signed)
Pt called and stated that the place on her breast is getting larger than what it was than it was when she was here the last time. She said that the only time that she has worn a covering on the area is when she is going to church and she puts an adhesive bandage over it and wears a bra. And when she took her bra off the scab came off and it has been oozing and this is clear. She stated that she had spoken with someone hear earlier and was told that she may have to go to the breast clinic. If this is so she would like to go somewhere here in [place].Sharon Reilly [place]

## 2014-06-26 NOTE — Telephone Encounter (Signed)
I'm placing referral for actually when care center in [place]. They do not have anything like that in [place]

## 2014-06-26 NOTE — Progress Notes (Signed)
She would prefer [facility]. The area was healing but now it has opened up again.

## 2014-07-02 ENCOUNTER — Encounter: Payer: Self-pay | Admitting: *Deleted

## 2014-07-02 ENCOUNTER — Emergency Department
Admission: EM | Admit: 2014-07-02 | Discharge: 2014-07-02 | Disposition: A | Discharge status: Home / Self Care | Payer: Commercial Managed Care - HMO | Source: Home / Self Care | Attending: Family Medicine | Admitting: Family Medicine

## 2014-07-02 DIAGNOSIS — B9789 Other viral agents as the cause of diseases classified elsewhere: Principal | ICD-10-CM

## 2014-07-02 DIAGNOSIS — J069 Acute upper respiratory infection, unspecified: Secondary | ICD-10-CM

## 2014-07-02 MED ORDER — CEFDINIR 300 MG PO CAPS: 300.0000 mg | ORAL_CAPSULE | Freq: Two times a day (BID) | ORAL | Status: DC

## 2014-07-02 NOTE — Discharge Instructions (Signed)
Take plain guaifenesin (1200mg  extended release tabs such as Mucinex) twice daily, with plenty of water, for cough and congestion.   May use Afrin nasal spray (or generic oxymetazoline) twice daily for about 5 days.  Also recommend using saline nasal spray several times daily and saline nasal irrigation (AYR is a common brand).  Use Flonase nasal spray each morning after using Afrin nasal spray and saline nasal irrigation. Try warm salt water gargles for sore throat.  Stop all antihistamines for now, and other non-prescription cough/cold preparations. May take Ibuprofen 200mg , 4 tabs every 8 hours with food for chest/sternum discomfort. Continue albuterol inhaler as needed.  May continue cough suppressant at bedtime. Follow-up with family doctor if not improving about one week.

## 2014-07-02 NOTE — ED Notes (Signed)
Sharon Reilly c/o HA, productive cough with yellow sputum, ear pain and hoarseness x 2-3 days. Using inhaler from previous bronchitis.

## 2014-07-02 NOTE — ED Provider Notes (Signed)
CSN: [id number]     Arrival date & time 07/02/14  1321 History   First MD Initiated Contact with Patient 07/02/14 1422     Chief Complaint  Patient presents with  . Cough  . Headache      HPI Comments: Patient complains of two day history of typical cold-like symptoms including mild sore throat, sinus congestion, headache, fatigue, and cough.  Her ears feel clogged.  She has had some wheezing and shortness of breath with activity improved with an albuterol inhaler.  She has a history of bronchitis after viral URI.  The history is provided by the patient.    Past Medical History  Diagnosis Date  . Lupus (systemic lupus erythematosus)   . Hypertension   . Obesity   . Hyperlipidemia   . Osteoarthritis   . Osteoporosis   . Lupus   . Fibromyalgia    Past Surgical History  Procedure Laterality Date  . Great toe fusion      left  . Abdominal hysterectomy      partial  . Breast surgery      reduction  . Epicondylitis      left  . Carpal tunnel release      left  . Right elbow    . Talonavicular joint fusion      LT foot  . Surgisis biodesign fistula plug    . Fistulotomy    . Mri syr ortho spec      Dr. Christen Butter   Family History  Problem Relation Age of Onset  . Breast cancer Mother   . Hyperlipidemia Mother   . Hypertension Mother   . Hyperlipidemia Father   . Diabetes Brother   . Diabetes      aunt  . Kidney failure Brother   . Thyroid disease Sister    History  Substance Use Topics  . Smoking status: Never Smoker   . Smokeless tobacco: Not on file  . Alcohol Use: No   OB History    No data available     Review of Systems + sore throat + cough No pleuritic pain but has tightness in anterior chest + wheezing + nasal congestion + post-nasal drainage No sinus pain/pressure No itchy/red eyes ? earache No hemoptysis + SOB with activity No fever/chills No nausea No vomiting No abdominal pain No diarrhea No urinary symptoms No skin rash +  fatigue No myalgias + headache Used OTC meds without relief  Allergies  Amoxicillin; Erythromycin; Sulfonamide derivatives; Doxycycline; and Sulfa antibiotics  Home Medications   Prior to Admission medications   Medication Sig Start Date End Date Taking? Authorizing Provider  AMBULATORY NON FORMULARY MEDICATION Medication Name: CPAP with mask and supplies and humidifier. Patient to be set on auto titrate 5-20 for one week and then request down low so that she her CPAP can be set.   Dx. OSA.  Please see attached home study 10/17/13   Hali Marry, MD  AMBULATORY NON FORMULARY MEDICATION Medication Name: CPAP set to 10 cm water pressure. Patient currently on autopap. Fax to [facility] 11/17/13   Hali Marry, MD  aspirin 81 MG tablet Take 81 mg by mouth daily.      Historical Provider, MD  cefdinir (OMNICEF) 300 MG capsule Take 1 capsule (300 mg total) by mouth 2 (two) times daily. 07/02/14   Kandra Nicolas, MD  fexofenadine (ALLEGRA) 180 MG tablet Take 1 tablet (180 mg total) by mouth daily. 09/16/10   Dell Ponto, DO  losartan-hydrochlorothiazide (HYZAAR) 100-25 MG per tablet Take 1 tablet by mouth daily. 01/24/14   Hali Marry, MD  methimazole (TAPAZOLE) 5 MG tablet  04/27/14   Historical Provider, MD  metoprolol (LOPRESSOR) 100 MG tablet Take 1 tablet (100 mg total) by mouth 2 (two) times daily. 03/01/14   Hali Marry, MD  Omega-3 Fatty Acids (FISH OIL) 1000 MG CAPS Take 2 g by mouth daily.    Historical Provider, MD  omeprazole (PRILOSEC) 20 MG capsule take 1 capsule by mouth once daily 03/27/14   Hali Marry, MD  simvastatin (ZOCOR) 20 MG tablet take 1 tablet by mouth at bedtime 06/27/14   Hali Marry, MD  triamcinolone ointment (KENALOG) 0.5 % Apply 1 application topically 2 (two) times daily. 05/10/14   Hali Marry, MD   BP 143/84 mmHg  Pulse 57  Temp(Src) 98.1 F (36.7 C) (Oral)  Resp 16  Wt 251 lb (113.853 kg)  SpO2 98% Physical  Exam Nursing notes and Vital Signs reviewed. Appearance:  Patient appears stated age, and in no acute distress.  Patient is obese. Eyes:  Pupils are equal, round, and reactive to light and accomodation.  Extraocular movement is intact.  Conjunctivae are not inflamed  Ears:  Canals normal.  Tympanic membranes normal.  Nose:  Mildly congested turbinates.  No sinus tenderness.   Pharynx:  Normal Neck:  Supple.   Tender enlarged posterior nodes are palpated bilaterally  Lungs:  Clear to auscultation.  Breath sounds are equal.  Heart:  Regular rate and rhythm without murmurs, rubs, or gallops.  Abdomen:  Nontender without masses or hepatosplenomegaly.  Bowel sounds are present.  No CVA or flank tenderness.  Extremities:  No edema.  No calf tenderness Skin:  No rash present.   ED Course  Procedures  none   MDM   1. Viral URI with cough    Patient has a history of bronchitis with URI; will begin Omnicef (patient has had no adverse effects from Keflex). Take plain guaifenesin (1200mg  extended release tabs such as Mucinex) twice daily, with plenty of water, for cough and congestion.   May use Afrin nasal spray (or generic oxymetazoline) twice daily for about 5 days.  Also recommend using saline nasal spray several times daily and saline nasal irrigation (AYR is a common brand).  Use Flonase nasal spray each morning after using Afrin nasal spray and saline nasal irrigation. Try warm salt water gargles for sore throat.  Stop all antihistamines for now, and other non-prescription cough/cold preparations. May take Ibuprofen 200mg , 4 tabs every 8 hours with food for chest/sternum discomfort. Continue albuterol inhaler as needed.  May continue cough suppressant at bedtime. Follow-up with family doctor if not improving about one week.    Kandra Nicolas, MD 07/02/14 [phone]

## 2014-07-03 DIAGNOSIS — S21001A Unspecified open wound of right breast, initial encounter: Secondary | ICD-10-CM | POA: Diagnosis not present

## 2014-07-04 DIAGNOSIS — S21009A Unspecified open wound of unspecified breast, initial encounter: Secondary | ICD-10-CM | POA: Diagnosis not present

## 2014-07-17 ENCOUNTER — Other Ambulatory Visit: Payer: Self-pay | Admitting: *Deleted

## 2014-07-17 DIAGNOSIS — S21001D Unspecified open wound of right breast, subsequent encounter: Secondary | ICD-10-CM

## 2014-07-19 DIAGNOSIS — G4733 Obstructive sleep apnea (adult) (pediatric): Secondary | ICD-10-CM | POA: Diagnosis not present

## 2014-07-28 ENCOUNTER — Other Ambulatory Visit: Payer: Self-pay | Admitting: Family Medicine

## 2014-07-31 DIAGNOSIS — S21001D Unspecified open wound of right breast, subsequent encounter: Secondary | ICD-10-CM | POA: Diagnosis not present

## 2014-08-09 DIAGNOSIS — L89899 Pressure ulcer of other site, unspecified stage: Secondary | ICD-10-CM | POA: Diagnosis not present

## 2014-08-10 ENCOUNTER — Ambulatory Visit (INDEPENDENT_AMBULATORY_CARE_PROVIDER_SITE_OTHER): Payer: Commercial Managed Care - HMO

## 2014-08-10 ENCOUNTER — Ambulatory Visit (INDEPENDENT_AMBULATORY_CARE_PROVIDER_SITE_OTHER): Payer: Commercial Managed Care - HMO | Admitting: Family Medicine

## 2014-08-10 ENCOUNTER — Encounter: Payer: Self-pay | Admitting: Family Medicine

## 2014-08-10 VITALS — BP 158/70 | HR 57 | Wt 253.0 lb

## 2014-08-10 DIAGNOSIS — S8991XA Unspecified injury of right lower leg, initial encounter: Secondary | ICD-10-CM | POA: Diagnosis not present

## 2014-08-10 DIAGNOSIS — M25561 Pain in right knee: Secondary | ICD-10-CM

## 2014-08-10 NOTE — Progress Notes (Signed)
   Subjective:    Patient ID: Sharon Reilly, female    DOB: 08-30-1955, 59 y.o.   MRN: [id number]  HPI Feel on ramp going into the store. She feel on both knees on 07/14/14. Right one was swollen immediately after the injury. She never saw any bruising or cuts or lacerations.. They are both sore. Can't put pressure on the right knee, such as kneeling onto her bed.  Using aleve, which helps some but she says she are takes it daily for other pains and aches. Overall her left knee is feeling much better than it was originally. Denies any fever chills or sweats.   Review of Systems     Objective:   Physical Exam  Constitutional: She appears well-developed and well-nourished.  Musculoskeletal:  Left knee with normal flexion and extension. Nontender over the patella or the patellar tendon. Some mild pain over the lateral joint line. The right knee has normal extension and flexion but she has pain with full flexion just at the base of the patella. She's also very tender over the lower edge of the patella and the lateral edge of the patella. She's also tender along the medial joint line. She does have crepitus of both knees with flexion and extension. Negative anterior drawer bilaterally. I was unable to perform a McMurray's because of discomfort. No significant edema bruising or trauma.  Skin: Skin is warm and dry.  Psychiatric: She has a normal mood and affect. Her behavior is normal.          Assessment & Plan:  Right knee pain - will start with plain x-rays since the original injury was 4 weeks ago and she still extremely tender on exam over the patella. Evaluate for possible fracture. Xray is negative for fracture. Recommend Aleve one po BID x 7 days, ice daily, elevared when able.  Can wear a knee sleeve if would like these are OTC. If not 50% better in 3 weeks then schedule with Dr. Darene Lamer.  She does have a lot of arthritis but have the kneecap. We can mail her a handout for patellofemoral syndrome  exercises to do on her own at home.

## 2014-08-15 DIAGNOSIS — E0590 Thyrotoxicosis, unspecified without thyrotoxic crisis or storm: Secondary | ICD-10-CM | POA: Diagnosis not present

## 2014-08-17 DIAGNOSIS — G4733 Obstructive sleep apnea (adult) (pediatric): Secondary | ICD-10-CM | POA: Diagnosis not present

## 2014-08-18 DIAGNOSIS — G4733 Obstructive sleep apnea (adult) (pediatric): Secondary | ICD-10-CM | POA: Diagnosis not present

## 2014-09-18 DIAGNOSIS — G4733 Obstructive sleep apnea (adult) (pediatric): Secondary | ICD-10-CM | POA: Diagnosis not present

## 2014-10-04 DIAGNOSIS — L89899 Pressure ulcer of other site, unspecified stage: Secondary | ICD-10-CM | POA: Diagnosis not present

## 2014-10-18 ENCOUNTER — Other Ambulatory Visit: Payer: Self-pay | Admitting: Family Medicine

## 2014-10-19 DIAGNOSIS — G4733 Obstructive sleep apnea (adult) (pediatric): Secondary | ICD-10-CM | POA: Diagnosis not present

## 2014-10-31 ENCOUNTER — Other Ambulatory Visit: Payer: Self-pay | Admitting: Family Medicine

## 2014-11-07 ENCOUNTER — Encounter: Payer: Self-pay | Admitting: Family Medicine

## 2014-11-07 ENCOUNTER — Ambulatory Visit (INDEPENDENT_AMBULATORY_CARE_PROVIDER_SITE_OTHER): Payer: Commercial Managed Care - HMO | Admitting: Family Medicine

## 2014-11-07 ENCOUNTER — Ambulatory Visit (INDEPENDENT_AMBULATORY_CARE_PROVIDER_SITE_OTHER): Payer: Commercial Managed Care - HMO

## 2014-11-07 VITALS — BP 138/84 | HR 61 | Wt 252.0 lb

## 2014-11-07 DIAGNOSIS — M5442 Lumbago with sciatica, left side: Secondary | ICD-10-CM

## 2014-11-07 DIAGNOSIS — M545 Low back pain: Secondary | ICD-10-CM

## 2014-11-07 DIAGNOSIS — M5440 Lumbago with sciatica, unspecified side: Secondary | ICD-10-CM | POA: Insufficient documentation

## 2014-11-07 MED ORDER — PREDNISONE 5 MG (48) PO TBPK: ORAL_TABLET | ORAL | Status: DC

## 2014-11-07 NOTE — Assessment & Plan Note (Signed)
I believe the patient's symptoms are related to sciatica. Plan to treat with a 5 mg 12 day prednisone dose pack. Patient is not interested in physical therapy at this time due to cost. Obtain lumbar x-rays. Return in 2-3 weeks. If symptoms are still persistent at this time would consider a ultrasound-guided left SI diagnostic and therapeutic injection versus MRI of L-spine.

## 2014-11-07 NOTE — Progress Notes (Signed)
   Subjective:    I'm seeing this patient as a consultation for:  Dr. Madilyn Fireman  CC: Left back and leg pain  HPI: Patient notes a one-month history of left back and leg pain. The pain radiates to her foot. She also notes some mild tingling sensation in the plantar foot. She denies any weakness or numbness bowel bladder dysfunction. No fevers chills nausea vomiting or diarrhea. She's tried some Tylenol and home exercises which helped only a little. She denies any injury. She feels well otherwise.  Past medical history, Surgical history, Family history not pertinant except as noted below, Social history, Allergies, and medications have been entered into the medical record, reviewed, and no changes needed.   Review of Systems: No headache, visual changes, nausea, vomiting, diarrhea, constipation, dizziness, abdominal pain, skin rash, fevers, chills, night sweats, weight loss, swollen lymph nodes, body aches, joint swelling, muscle aches, chest pain, shortness of breath, mood changes, visual or auditory hallucinations.   Objective:    Filed Vitals:   11/07/14 1010  BP: 138/84  Pulse: 61   General: Well Developed, well nourished, and in no acute distress.  Neuro/Psych: Alert and oriented x3, extra-ocular muscles intact, able to move all 4 extremities, sensation grossly intact. Skin: Warm and dry, no rashes noted.  Respiratory: Not using accessory muscles, speaking in full sentences, trachea midline.  Cardiovascular: Pulses palpable, no extremity edema. Abdomen: Does not appear distended. MSK: Back is nontender to midline. Mildly tender to palpation left SI joint. Lumbar range of motion is limited in flexion but normal extension and rotation. Negative straight leg raise test bilaterally. Positive left-sided Faber test. Lower extremity reflexes are equal and normal bilaterally knees and ankles. Strength is intact throughout. Patient can get on and off the exam table squats down on her toes and  heels. Sensation is intact throughout. Mild antalgic gait.  No results found for this or any previous visit (from the past 24 hour(s)). No results found.  Impression and Recommendations:   This case required medical decision making of moderate complexity.

## 2014-11-07 NOTE — Patient Instructions (Signed)
Thank you for coming in today. Come back or go to the emergency room if you notice new weakness new numbness problems walking or bowel or bladder problems. Return in 2-3 weeks for recheck.   Sciatica with Rehab The sciatic nerve runs from the back down the leg and is responsible for sensation and control of the muscles in the back (posterior) side of the thigh, lower leg, and foot. Sciatica is a condition that is characterized by inflammation of this nerve.  SYMPTOMS   Signs of nerve damage, including numbness and/or weakness along the posterior side of the lower extremity.  Pain in the back of the thigh that may also travel down the leg.  Pain that worsens when sitting for long periods of time.  Occasionally, pain in the back or buttock. CAUSES  Inflammation of the sciatic nerve is the cause of sciatica. The inflammation is due to something irritating the nerve. Common sources of irritation include:  Sitting for long periods of time.  Direct trauma to the nerve.  Arthritis of the spine.  Herniated or ruptured disk.  Slipping of the vertebrae (spondylolisthesis).  Pressure from soft tissues, such as muscles or ligament-like tissue (fascia). RISK INCREASES WITH:  Sports that place pressure or stress on the spine (football or weightlifting).  Poor strength and flexibility.  Failure to warm up properly before activity.  Family history of low back pain or disk disorders.  Previous back injury or surgery.  Poor body mechanics, especially when lifting, or poor posture. PREVENTION   Warm up and stretch properly before activity.  Maintain physical fitness:  Strength, flexibility, and endurance.  Cardiovascular fitness.  Learn and use proper technique, especially with posture and lifting. When possible, have coach correct improper technique.  Avoid activities that place stress on the spine. PROGNOSIS If treated properly, then sciatica usually resolves within 6 weeks.  However, occasionally surgery is necessary.  RELATED COMPLICATIONS   Permanent nerve damage, including pain, numbness, tingle, or weakness.  Chronic back pain.  Risks of surgery: infection, bleeding, nerve damage, or damage to surrounding tissues. TREATMENT Treatment initially involves resting from any activities that aggravate your symptoms. The use of ice and medication may help reduce pain and inflammation. The use of strengthening and stretching exercises may help reduce pain with activity. These exercises may be performed at home or with referral to a therapist. A therapist may recommend further treatments, such as transcutaneous electronic nerve stimulation (TENS) or ultrasound. Your caregiver may recommend corticosteroid injections to help reduce inflammation of the sciatic nerve. If symptoms persist despite non-surgical (conservative) treatment, then surgery may be recommended. MEDICATION  If pain medication is necessary, then nonsteroidal anti-inflammatory medications, such as aspirin and ibuprofen, or other minor pain relievers, such as acetaminophen, are often recommended.  Do not take pain medication for 7 days before surgery.  Prescription pain relievers may be given if deemed necessary by your caregiver. Use only as directed and only as much as you need.  Ointments applied to the skin may be helpful.  Corticosteroid injections may be given by your caregiver. These injections should be reserved for the most serious cases, because they may only be given a certain number of times. HEAT AND COLD  Cold treatment (icing) relieves pain and reduces inflammation. Cold treatment should be applied for 10 to 15 minutes every 2 to 3 hours for inflammation and pain and immediately after any activity that aggravates your symptoms. Use ice packs or massage the area with a piece of ice (  ice massage).  Heat treatment may be used prior to performing the stretching and strengthening activities  prescribed by your caregiver, physical therapist, or athletic trainer. Use a heat pack or soak the injury in warm water. SEEK MEDICAL CARE IF:  Treatment seems to offer no benefit, or the condition worsens.  Any medications produce adverse side effects. EXERCISES  RANGE OF MOTION (ROM) AND STRETCHING EXERCISES - Sciatica Most people with sciatic will find that their symptoms worsen with either excessive bending forward (flexion) or arching at the low back (extension). The exercises which will help resolve your symptoms will focus on the opposite motion. Your physician, physical therapist or athletic trainer will help you determine which exercises will be most helpful to resolve your low back pain. Do not complete any exercises without first consulting with your clinician. Discontinue any exercises which worsen your symptoms until you speak to your clinician. If you have pain, numbness or tingling which travels down into your buttocks, leg or foot, the goal of the therapy is for these symptoms to move closer to your back and eventually resolve. Occasionally, these leg symptoms will get better, but your low back pain may worsen; this is typically an indication of progress in your rehabilitation. Be certain to be very alert to any changes in your symptoms and the activities in which you participated in the 24 hours prior to the change. Sharing this information with your clinician will allow him/her to most efficiently treat your condition. These exercises may help you when beginning to rehabilitate your injury. Your symptoms may resolve with or without further involvement from your physician, physical therapist or athletic trainer. While completing these exercises, remember:   Restoring tissue flexibility helps normal motion to return to the joints. This allows healthier, less painful movement and activity.  An effective stretch should be held for at least 30 seconds.  A stretch should never be painful.  You should only feel a gentle lengthening or release in the stretched tissue. FLEXION RANGE OF MOTION AND STRETCHING EXERCISES: STRETCH - Flexion, Single Knee to Chest   Lie on a firm bed or floor with both legs extended in front of you.  Keeping one leg in contact with the floor, bring your opposite knee to your chest. Hold your leg in place by either grabbing behind your thigh or at your knee.  Pull until you feel a gentle stretch in your low back. Hold __________ seconds.  Slowly release your grasp and repeat the exercise with the opposite side. Repeat __________ times. Complete this exercise __________ times per day.  STRETCH - Flexion, Double Knee to Chest  Lie on a firm bed or floor with both legs extended in front of you.  Keeping one leg in contact with the floor, bring your opposite knee to your chest.  Tense your stomach muscles to support your back and then lift your other knee to your chest. Hold your legs in place by either grabbing behind your thighs or at your knees.  Pull both knees toward your chest until you feel a gentle stretch in your low back. Hold __________ seconds.  Tense your stomach muscles and slowly return one leg at a time to the floor. Repeat __________ times. Complete this exercise __________ times per day.  STRETCH - Low Trunk Rotation   Lie on a firm bed or floor. Keeping your legs in front of you, bend your knees so they are both pointed toward the ceiling and your feet are flat on the  floor.  Extend your arms out to the side. This will stabilize your upper body by keeping your shoulders in contact with the floor.  Gently and slowly drop both knees together to one side until you feel a gentle stretch in your low back. Hold for __________ seconds.  Tense your stomach muscles to support your low back as you bring your knees back to the starting position. Repeat the exercise to the other side. Repeat __________ times. Complete this exercise __________  times per day  EXTENSION RANGE OF MOTION AND FLEXIBILITY EXERCISES: STRETCH - Extension, Prone on Elbows  Lie on your stomach on the floor, a bed will be too soft. Place your palms about shoulder width apart and at the height of your head.  Place your elbows under your shoulders. If this is too painful, stack pillows under your chest.  Allow your body to relax so that your hips drop lower and make contact more completely with the floor.  Hold this position for __________ seconds.  Slowly return to lying flat on the floor. Repeat __________ times. Complete this exercise __________ times per day.  RANGE OF MOTION - Extension, Prone Press Ups  Lie on your stomach on the floor, a bed will be too soft. Place your palms about shoulder width apart and at the height of your head.  Keeping your back as relaxed as possible, slowly straighten your elbows while keeping your hips on the floor. You may adjust the placement of your hands to maximize your comfort. As you gain motion, your hands will come more underneath your shoulders.  Hold this position __________ seconds.  Slowly return to lying flat on the floor. Repeat __________ times. Complete this exercise __________ times per day.  STRENGTHENING EXERCISES - Sciatica  These exercises may help you when beginning to rehabilitate your injury. These exercises should be done near your "sweet spot." This is the neutral, low-back arch, somewhere between fully rounded and fully arched, that is your least painful position. When performed in this safe range of motion, these exercises can be used for people who have either a flexion or extension based injury. These exercises may resolve your symptoms with or without further involvement from your physician, physical therapist or athletic trainer. While completing these exercises, remember:   Muscles can gain both the endurance and the strength needed for everyday activities through controlled  exercises.  Complete these exercises as instructed by your physician, physical therapist or athletic trainer. Progress with the resistance and repetition exercises only as your caregiver advises.  You may experience muscle soreness or fatigue, but the pain or discomfort you are trying to eliminate should never worsen during these exercises. If this pain does worsen, stop and make certain you are following the directions exactly. If the pain is still present after adjustments, discontinue the exercise until you can discuss the trouble with your clinician. STRENGTHENING - Deep Abdominals, Pelvic Tilt   Lie on a firm bed or floor. Keeping your legs in front of you, bend your knees so they are both pointed toward the ceiling and your feet are flat on the floor.  Tense your lower abdominal muscles to press your low back into the floor. This motion will rotate your pelvis so that your tail bone is scooping upwards rather than pointing at your feet or into the floor.  With a gentle tension and even breathing, hold this position for __________ seconds. Repeat __________ times. Complete this exercise __________ times per day.  STRENGTHENING -  Abdominals, Crunches   Lie on a firm bed or floor. Keeping your legs in front of you, bend your knees so they are both pointed toward the ceiling and your feet are flat on the floor. Cross your arms over your chest.  Slightly tip your chin down without bending your neck.  Tense your abdominals and slowly lift your trunk high enough to just clear your shoulder blades. Lifting higher can put excessive stress on the low back and does not further strengthen your abdominal muscles.  Control your return to the starting position. Repeat __________ times. Complete this exercise __________ times per day.  STRENGTHENING - Quadruped, Opposite UE/LE Lift  Assume a hands and knees position on a firm surface. Keep your hands under your shoulders and your knees under your  hips. You may place padding under your knees for comfort.  Find your neutral spine and gently tense your abdominal muscles so that you can maintain this position. Your shoulders and hips should form a rectangle that is parallel with the floor and is not twisted.  Keeping your trunk steady, lift your right hand no higher than your shoulder and then your left leg no higher than your hip. Make sure you are not holding your breath. Hold this position __________ seconds.  Continuing to keep your abdominal muscles tense and your back steady, slowly return to your starting position. Repeat with the opposite arm and leg. Repeat __________ times. Complete this exercise __________ times per day.  STRENGTHENING - Abdominals and Quadriceps, Straight Leg Raise   Lie on a firm bed or floor with both legs extended in front of you.  Keeping one leg in contact with the floor, bend the other knee so that your foot can rest flat on the floor.  Find your neutral spine, and tense your abdominal muscles to maintain your spinal position throughout the exercise.  Slowly lift your straight leg off the floor about 6 inches for a count of 15, making sure to not hold your breath.  Still keeping your neutral spine, slowly lower your leg all the way to the floor. Repeat this exercise with each leg __________ times. Complete this exercise __________ times per day. POSTURE AND BODY MECHANICS CONSIDERATIONS - Sciatica Keeping correct posture when sitting, standing or completing your activities will reduce the stress put on different body tissues, allowing injured tissues a chance to heal and limiting painful experiences. The following are general guidelines for improved posture. Your physician or physical therapist will provide you with any instructions specific to your needs. While reading these guidelines, remember:  The exercises prescribed by your Nataly Pacifico will help you have the flexibility and strength to maintain  correct postures.  The correct posture provides the optimal environment for your joints to work. All of your joints have less wear and tear when properly supported by a spine with good posture. This means you will experience a healthier, less painful body.  Correct posture must be practiced with all of your activities, especially prolonged sitting and standing. Correct posture is as important when doing repetitive low-stress activities (typing) as it is when doing a single heavy-load activity (lifting). RESTING POSITIONS Consider which positions are most painful for you when choosing a resting position. If you have pain with flexion-based activities (sitting, bending, stooping, squatting), choose a position that allows you to rest in a less flexed posture. You would want to avoid curling into a fetal position on your side. If your pain worsens with extension-based activities (prolonged standing,  working overhead), avoid resting in an extended position such as sleeping on your stomach. Most people will find more comfort when they rest with their spine in a more neutral position, neither too rounded nor too arched. Lying on a non-sagging bed on your side with a pillow between your knees, or on your back with a pillow under your knees will often provide some relief. Keep in mind, being in any one position for a prolonged period of time, no matter how correct your posture, can still lead to stiffness. PROPER SITTING POSTURE In order to minimize stress and discomfort on your spine, you must sit with correct posture Sitting with good posture should be effortless for a healthy body. Returning to good posture is a gradual process. Many people can work toward this most comfortably by using various supports until they have the flexibility and strength to maintain this posture on their own. When sitting with proper posture, your ears will fall over your shoulders and your shoulders will fall over your hips. You should  use the back of the chair to support your upper back. Your low back will be in a neutral position, just slightly arched. You may place a small pillow or folded towel at the base of your low back for support.  When working at a desk, create an environment that supports good, upright posture. Without extra support, muscles fatigue and lead to excessive strain on joints and other tissues. Keep these recommendations in mind: CHAIR:   A chair should be able to slide under your desk when your back makes contact with the back of the chair. This allows you to work closely.  The chair's height should allow your eyes to be level with the upper part of your monitor and your hands to be slightly lower than your elbows. BODY POSITION  Your feet should make contact with the floor. If this is not possible, use a foot rest.  Keep your ears over your shoulders. This will reduce stress on your neck and low back. INCORRECT SITTING POSTURES   If you are feeling tired and unable to assume a healthy sitting posture, do not slouch or slump. This puts excessive strain on your back tissues, causing more damage and pain. Healthier options include:  Using more support, like a lumbar pillow.  Switching tasks to something that requires you to be upright or walking.  Talking a brief walk.  Lying down to rest in a neutral-spine position. PROLONGED STANDING WHILE SLIGHTLY LEANING FORWARD  When completing a task that requires you to lean forward while standing in one place for a long time, place either foot up on a stationary 2-4 inch high object to help maintain the best posture. When both feet are on the ground, the low back tends to lose its slight inward curve. If this curve flattens (or becomes too large), then the back and your other joints will experience too much stress, fatigue more quickly and can cause pain.  CORRECT STANDING POSTURES Proper standing posture should be assumed with all daily activities, even if  they only take a few moments, like when brushing your teeth. As in sitting, your ears should fall over your shoulders and your shoulders should fall over your hips. You should keep a slight tension in your abdominal muscles to brace your spine. Your tailbone should point down to the ground, not behind your body, resulting in an over-extended swayback posture.  INCORRECT STANDING POSTURES  Common incorrect standing postures include a forward head,  locked knees and/or an excessive swayback. WALKING Walk with an upright posture. Your ears, shoulders and hips should all line-up. PROLONGED ACTIVITY IN A FLEXED POSITION When completing a task that requires you to bend forward at your waist or lean over a low surface, try to find a way to stabilize 3 of 4 of your limbs. You can place a hand or elbow on your thigh or rest a knee on the surface you are reaching across. This will provide you more stability so that your muscles do not fatigue as quickly. By keeping your knees relaxed, or slightly bent, you will also reduce stress across your low back. CORRECT LIFTING TECHNIQUES DO :   Assume a wide stance. This will provide you more stability and the opportunity to get as close as possible to the object which you are lifting.  Tense your abdominals to brace your spine; then bend at the knees and hips. Keeping your back locked in a neutral-spine position, lift using your leg muscles. Lift with your legs, keeping your back straight.  Test the weight of unknown objects before attempting to lift them.  Try to keep your elbows locked down at your sides in order get the best strength from your shoulders when carrying an object.  Always ask for help when lifting heavy or awkward objects. INCORRECT LIFTING TECHNIQUES DO NOT:   Lock your knees when lifting, even if it is a small object.  Bend and twist. Pivot at your feet or move your feet when needing to change directions.  Assume that you cannot safely pick  up a paperclip without proper posture. Document Released: 02/02/2005 Document Revised: 06/19/2013 Document Reviewed: 05/17/2008 [facility] Patient Information 2015 [place], [place]. This information is not intended to replace advice given to you by your health care Tahlor Berenguer. Make sure you discuss any questions you have with your health care [place].

## 2014-11-07 NOTE — Progress Notes (Signed)
Quick Note:  She has a lot of arthritis in her back. This is likely causing the back pain. If she does not improve return and we will do the SI injection. ______

## 2014-11-14 ENCOUNTER — Ambulatory Visit: Payer: Commercial Managed Care - HMO | Admitting: Family Medicine

## 2014-11-14 DIAGNOSIS — E0590 Thyrotoxicosis, unspecified without thyrotoxic crisis or storm: Secondary | ICD-10-CM | POA: Diagnosis not present

## 2014-11-16 ENCOUNTER — Ambulatory Visit (INDEPENDENT_AMBULATORY_CARE_PROVIDER_SITE_OTHER): Payer: Commercial Managed Care - HMO | Admitting: Family Medicine

## 2014-11-16 ENCOUNTER — Encounter: Payer: Self-pay | Admitting: Family Medicine

## 2014-11-16 VITALS — BP 122/70 | HR 56 | Temp 98.0°F | Wt 252.0 lb

## 2014-11-16 DIAGNOSIS — I10 Essential (primary) hypertension: Secondary | ICD-10-CM

## 2014-11-16 DIAGNOSIS — E039 Hypothyroidism, unspecified: Secondary | ICD-10-CM | POA: Diagnosis not present

## 2014-11-16 DIAGNOSIS — J069 Acute upper respiratory infection, unspecified: Secondary | ICD-10-CM

## 2014-11-16 DIAGNOSIS — Z23 Encounter for immunization: Secondary | ICD-10-CM

## 2014-11-16 NOTE — Progress Notes (Signed)
   Subjective:    Patient ID: Sharon Reilly, female    DOB: 03-13-1955, 59 y.o.   MRN: [id number]  HPI Hypertension- Pt denies chest pain, SOB, dizziness, or heart palpitations.  Taking meds as directed w/o problems.  Denies medication side effects.  Has really cut back on salt.     Has had a cold for about 2 weeks. Says now just cough with some yellow mucous. No fever or chills.  No sinus pain.  Has been having more HA than usuall.  Only on 4 more days of prednisone for her back.  Hasn't been able to wear her CPAP consistantly.     Review of Systems     Objective:   Physical Exam  Constitutional: She is oriented to person, place, and time. She appears well-developed and well-nourished.  HENT:  Head: Normocephalic and atraumatic.  Right Ear: External ear normal.  Left Ear: External ear normal.  Nose: Nose normal.  Mouth/Throat: Oropharynx is clear and moist.  TMs and canals are clear.   Eyes: Conjunctivae and EOM are normal. Pupils are equal, round, and reactive to light.  Neck: Neck supple. No thyromegaly present.  Cardiovascular: Normal rate, regular rhythm and normal heart sounds.   Pulmonary/Chest: Effort normal and breath sounds normal. She has no wheezes.  Lymphadenopathy:    She has no cervical adenopathy.  Neurological: She is alert and oriented to person, place, and time.  Skin: Skin is warm and dry.  Psychiatric: She has a normal mood and affect.          Assessment & Plan:  HTN - well controlled.  She is due for lipid panel but recent kidney and liver function are normal and are found in care everywhere. Next  Hypothyroidism-Dr. Steffanie Dunn is following this. Her dose was recently adjusted.  URI  -  Likely viral with postinfectious cough. Call if suddenly worse or if not better in one to 2 weeks. Okay for symptomatic care.

## 2014-11-21 ENCOUNTER — Ambulatory Visit (INDEPENDENT_AMBULATORY_CARE_PROVIDER_SITE_OTHER): Payer: Commercial Managed Care - HMO | Admitting: Family Medicine

## 2014-11-21 ENCOUNTER — Encounter: Payer: Self-pay | Admitting: Family Medicine

## 2014-11-21 VITALS — BP 150/83 | HR 58 | Wt 249.0 lb

## 2014-11-21 DIAGNOSIS — M5442 Lumbago with sciatica, left side: Secondary | ICD-10-CM

## 2014-11-21 DIAGNOSIS — I10 Essential (primary) hypertension: Secondary | ICD-10-CM | POA: Diagnosis not present

## 2014-11-21 NOTE — Assessment & Plan Note (Signed)
Improved Status post prednisone Dosepak. Plan to take gabapentin 3 times daily. Return as needed.

## 2014-11-21 NOTE — Patient Instructions (Signed)
Thank you for coming in today. Take gabapentin three times daily.  Return as needed if worse.  Come back or go to the emergency room if you notice new weakness new numbness problems walking or bowel or bladder problems.  Sciatica Sciatica is pain, weakness, numbness, or tingling along your sciatic nerve. The nerve starts in the lower back and runs down the back of each leg. Nerve damage or certain conditions pinch or put pressure on the sciatic nerve. This causes the pain, weakness, and other discomforts of sciatica. HOME CARE   Only take medicine as told by your doctor.  Apply ice to the affected area for 20 minutes. Do this 3-4 times a day for the first 48-72 hours. Then try heat in the same way.  Exercise, stretch, or do your usual activities if these do not make your pain worse.  Go to physical therapy as told by your doctor.  Keep all doctor visits as told.  Do not wear high heels or shoes that are not supportive.  Get a firm mattress if your mattress is too soft to lessen pain and discomfort. GET HELP RIGHT AWAY IF:   You cannot control when you poop (bowel movement) or pee (urinate).  You have more weakness in your lower back, lower belly (pelvis), butt (buttocks), or legs.  You have redness or puffiness (swelling) of your back.  You have a burning feeling when you pee.  You have pain that gets worse when you lie down.  You have pain that wakes you from your sleep.  Your pain is worse than past pain.  Your pain lasts longer than 4 weeks.  You are suddenly losing weight without reason. MAKE SURE YOU:   Understand these instructions.  Will watch this condition.  Will get help right away if you are not doing well or get worse.   This information is not intended to replace advice given to you by your health care provider. Make sure you discuss any questions you have with your health care provider.   Document Released: 11/12/2007 Document Revised: 10/24/2014  Document Reviewed: 06/14/2011 Elsevier Interactive Patient Education [employer].

## 2014-11-21 NOTE — Progress Notes (Signed)
Sharon Reilly is a 59 y.o. female who presents to [facility]  today for follow-up back pain and left leg sciatica. Patient was seen in September 24 and diagnosed with left leg sciatica. She was treated with a prednisone Dosepak. She declined physical therapy. She notes her pain is much improved. However she notes continued numb tingly sensation radiating to her left leg. She denies any weakness or difficulty walking or bowel or bladder dysfunction. Patient states that she has gabapentin that she takes intermittently. She states this helps some.  Past Medical History  Diagnosis Date  . Lupus (systemic lupus erythematosus) ([place])   . Hypertension   . Obesity   . Hyperlipidemia   . Osteoarthritis   . Osteoporosis   . Lupus ([place])   . Fibromyalgia    Past Surgical History  Procedure Laterality Date  . Great toe fusion      left  . Abdominal hysterectomy      partial  . Breast surgery      reduction  . Epicondylitis      left  . Carpal tunnel release      left  . Right elbow    . Talonavicular joint fusion      LT foot  . Surgisis biodesign fistula plug    . Fistulotomy    . Mri syr ortho spec      Dr. Christen Butter   Social History  Substance Use Topics  . Smoking status: Never Smoker   . Smokeless tobacco: Not on file  . Alcohol Use: No   family history includes Breast cancer in her mother; Diabetes in her brother and another family member; Hyperlipidemia in her father and mother; Hypertension in her mother; Kidney failure in her brother; Thyroid disease in her sister.  ROS as above Medications: Current Outpatient Prescriptions  Medication Sig Dispense Refill  . aspirin 81 MG tablet Take 81 mg by mouth daily.      . fexofenadine (ALLEGRA) 180 MG tablet Take 1 tablet (180 mg total) by mouth daily. 30 tablet 6  . losartan-hydrochlorothiazide (HYZAAR) 100-25 MG per tablet take 1 tablet by mouth once daily **MUST HAVE OFFICE VIST FOR FURTHER  REFILLS PER MD 30 tablet 0  . methimazole (TAPAZOLE) 5 MG tablet   0  . metoprolol (LOPRESSOR) 100 MG tablet Take 1 tablet (100 mg total) by mouth 2 (two) times daily. 180 tablet 2  . Omega-3 Fatty Acids (FISH OIL) 1000 MG CAPS Take 2 g by mouth daily.    [name] omeprazole (PRILOSEC) 20 MG capsule take 1 capsule by mouth once daily 90 capsule 3  . simvastatin (ZOCOR) 20 MG tablet take 1 tablet by mouth at bedtime 90 tablet 0   No current facility-administered medications for this visit.   Allergies  Allergen Reactions  . Amoxicillin Itching    No rash.   . Erythromycin   . Sulfonamide Derivatives   . Doxycycline Rash  . Sulfa Antibiotics Rash     Exam:  BP 150/83 mmHg  Pulse 58  Wt 249 lb (112.946 kg) Gen: Well NAD Back: Normal gait. Normal strength. Patient is able to rise from a seated position and walks normally.  No results found for this or any previous visit (from the past 24 hour(s)). No results found.   Please see individual assessment and plan sections.

## 2014-11-22 LAB — LIPID PANEL
Cholesterol: 211 mg/dL — ABNORMAL HIGH (ref 125–200)
HDL: 54 mg/dL (ref >=46)
LDL CALC: 142 mg/dL — AB (ref <130)
TRIGLYCERIDES: 75 mg/dL (ref <150)
Total CHOL/HDL Ratio: 3.9 Ratio (ref <=5.0)
VLDL: 15 mg/dL (ref <30)

## 2014-11-26 ENCOUNTER — Other Ambulatory Visit: Payer: Self-pay | Admitting: Family Medicine

## 2014-12-06 DIAGNOSIS — L89899 Pressure ulcer of other site, unspecified stage: Secondary | ICD-10-CM | POA: Diagnosis not present

## 2014-12-10 ENCOUNTER — Telehealth: Payer: Self-pay | Admitting: *Deleted

## 2014-12-10 DIAGNOSIS — S21001D Unspecified open wound of right breast, subsequent encounter: Secondary | ICD-10-CM

## 2014-12-10 NOTE — Telephone Encounter (Signed)
Pt called and lvm stating that she will need a new referral for derm. Maryruth Eve, [name]

## 2014-12-21 ENCOUNTER — Other Ambulatory Visit: Payer: Self-pay | Admitting: Family Medicine

## 2014-12-21 DIAGNOSIS — Z1231 Encounter for screening mammogram for malignant neoplasm of breast: Secondary | ICD-10-CM

## 2015-01-01 ENCOUNTER — Ambulatory Visit (INDEPENDENT_AMBULATORY_CARE_PROVIDER_SITE_OTHER): Payer: Commercial Managed Care - HMO | Admitting: Family Medicine

## 2015-01-01 ENCOUNTER — Encounter: Payer: Self-pay | Admitting: Family Medicine

## 2015-01-01 VITALS — BP 128/72 | HR 59 | Temp 98.0°F | Wt 256.2 lb

## 2015-01-01 DIAGNOSIS — S21001A Unspecified open wound of right breast, initial encounter: Secondary | ICD-10-CM

## 2015-01-01 MED ORDER — CEPHALEXIN 500 MG PO CAPS: 500.0000 mg | ORAL_CAPSULE | Freq: Three times a day (TID) | ORAL | Status: DC

## 2015-01-01 NOTE — Progress Notes (Signed)
   Subjective:    Patient ID: Sharon Reilly, female    DOB: Dec 29, 1955, 59 y.o.   MRN: [id number]  HPI She has a new wound on her right breast. Says she has a tegederam on the wound that she is seeing wound care but but it causes a lot of itching and says she scratched a spot near it.  Has been there for 3 days and she is worried as it started draining this morning. No fever or chills.  Reports the drainage was yellow.    Review of Systems     Objective:   Physical Exam  Constitutional: She is oriented to person, place, and time. She appears well-developed and well-nourished.  HENT:  Head: Normocephalic.  Neurological: She is oriented to person, place, and time.  Skin: Skin is warm and dry.  approx  1 cm yellow scab that is circular on the right breast.  No active drainage.  Approx 2 cm of surrounding erythema.   Psychiatric: She has a normal mood and affect. Her behavior is normal.    She has Tegederm in place on right breast over another wound       Assessment & Plan:  Wound on the right breat approx 1 cm in size with a moist yellow type scab.  We discussed treatment just applying Vaseline with some nonstick gauze No tape or anything with an adhesive. This can be kept in place with her bra. I did go ahead and put her on Keflex the she does have some surrounding him patient and erythema there with unclear if this is just early inflammation or possibly early infection.

## 2015-01-01 NOTE — Addendum Note (Signed)
Addended by: Teddy Spike on: 01/01/2015 12:10 PM   Modules accepted: Orders

## 2015-01-04 LAB — WOUND CULTURE: Gram Stain: NONE SEEN

## 2015-01-18 ENCOUNTER — Other Ambulatory Visit: Payer: Self-pay | Admitting: Family Medicine

## 2015-01-30 ENCOUNTER — Ambulatory Visit: Payer: Commercial Managed Care - HMO

## 2015-02-06 ENCOUNTER — Other Ambulatory Visit: Payer: Self-pay | Admitting: Family Medicine

## 2015-02-27 DIAGNOSIS — E0590 Thyrotoxicosis, unspecified without thyrotoxic crisis or storm: Secondary | ICD-10-CM | POA: Diagnosis not present

## 2015-03-16 ENCOUNTER — Other Ambulatory Visit: Payer: Self-pay | Admitting: Family Medicine

## 2015-03-18 DIAGNOSIS — L239 Allergic contact dermatitis, unspecified cause: Secondary | ICD-10-CM | POA: Diagnosis not present

## 2015-03-18 DIAGNOSIS — D485 Neoplasm of uncertain behavior of skin: Secondary | ICD-10-CM | POA: Diagnosis not present

## 2015-04-17 DIAGNOSIS — G4733 Obstructive sleep apnea (adult) (pediatric): Secondary | ICD-10-CM | POA: Diagnosis not present

## 2015-04-29 ENCOUNTER — Other Ambulatory Visit: Payer: Self-pay | Admitting: Family Medicine

## 2015-05-15 ENCOUNTER — Encounter: Payer: Self-pay | Admitting: *Deleted

## 2015-05-15 ENCOUNTER — Telehealth: Payer: Self-pay | Admitting: *Deleted

## 2015-05-15 NOTE — Telephone Encounter (Signed)
Pt called and requested a letter to be excused from jury duty. Letter written pt notified.[facility]

## 2015-05-22 ENCOUNTER — Encounter: Payer: Self-pay | Admitting: Family Medicine

## 2015-05-22 ENCOUNTER — Ambulatory Visit (INDEPENDENT_AMBULATORY_CARE_PROVIDER_SITE_OTHER): Payer: Commercial Managed Care - HMO | Admitting: Family Medicine

## 2015-05-22 VITALS — BP 130/74 | HR 62 | Wt 252.0 lb

## 2015-05-22 DIAGNOSIS — I10 Essential (primary) hypertension: Secondary | ICD-10-CM

## 2015-05-22 DIAGNOSIS — R7989 Other specified abnormal findings of blood chemistry: Secondary | ICD-10-CM

## 2015-05-22 DIAGNOSIS — R5383 Other fatigue: Secondary | ICD-10-CM

## 2015-05-22 DIAGNOSIS — R748 Abnormal levels of other serum enzymes: Secondary | ICD-10-CM

## 2015-05-22 DIAGNOSIS — E041 Nontoxic single thyroid nodule: Secondary | ICD-10-CM

## 2015-05-22 DIAGNOSIS — E0590 Thyrotoxicosis, unspecified without thyrotoxic crisis or storm: Secondary | ICD-10-CM | POA: Diagnosis not present

## 2015-05-22 LAB — CBC WITH DIFFERENTIAL/PLATELET
Basophils Absolute: 47 cells/uL (ref 0–200)
Basophils Relative: 1 %
EOS PCT: 12 %
Eosinophils Absolute: 564 cells/uL — ABNORMAL HIGH (ref 15–500)
HEMATOCRIT: 32.9 % — AB (ref 35.0–45.0)
Hemoglobin: 10.8 g/dL — ABNORMAL LOW (ref 11.7–15.5)
Lymphocytes Relative: 42 %
Lymphs Abs: 1974 cells/uL (ref 850–3900)
MCH: 29.8 pg (ref 27.0–33.0)
MCHC: 32.8 g/dL (ref 32.0–36.0)
MCV: 90.9 fL (ref 80.0–100.0)
MONO ABS: 329 {cells}/uL (ref 200–950)
MPV: 10.3 fL (ref 7.5–12.5)
Monocytes Relative: 7 %
NEUTROS PCT: 38 %
Neutro Abs: 1786 cells/uL (ref 1500–7800)
Platelets: 272 10*3/uL (ref 140–400)
RBC: 3.62 MIL/uL — AB (ref 3.80–5.10)
RDW: 14.9 % (ref 11.0–15.0)
WBC: 4.7 10*3/uL (ref 3.8–10.8)

## 2015-05-22 LAB — IRON AND TIBC
%SAT: 20 % (ref 11–50)
IRON: 52 ug/dL (ref 45–160)
TIBC: 261 ug/dL (ref 250–450)
UIBC: 209 ug/dL (ref 125–400)

## 2015-05-22 LAB — COMPLETE METABOLIC PANEL WITH GFR
ALBUMIN: 3.5 g/dL — AB (ref 3.6–5.1)
ALT: 13 U/L (ref 6–29)
AST: 16 U/L (ref 10–35)
Alkaline Phosphatase: 84 U/L (ref 33–130)
BILIRUBIN TOTAL: 0.3 mg/dL (ref 0.2–1.2)
BUN: 26 mg/dL — ABNORMAL HIGH (ref 7–25)
CALCIUM: 9 mg/dL (ref 8.6–10.4)
CO2: 24 mmol/L (ref 20–31)
CREATININE: 1.14 mg/dL — AB (ref 0.50–1.05)
Chloride: 108 mmol/L (ref 98–110)
GFR, EST AFRICAN AMERICAN: 61 mL/min (ref >=60)
GFR, Est Non African American: 53 mL/min — ABNORMAL LOW (ref >=60)
Glucose, Bld: 86 mg/dL (ref 65–99)
Potassium: 4.1 mmol/L (ref 3.5–5.3)
Sodium: 141 mmol/L (ref 135–146)
TOTAL PROTEIN: 6.5 g/dL (ref 6.1–8.1)

## 2015-05-22 LAB — FOLATE: Folate: >24 ng/mL (ref >5.4)

## 2015-05-22 LAB — TSH: TSH: 3.01 m[IU]/L

## 2015-05-22 LAB — FERRITIN: Ferritin: 60 ng/mL (ref 10–232)

## 2015-05-22 LAB — VITAMIN B12: Vitamin B-12: 521 pg/mL (ref 200–1100)

## 2015-05-22 LAB — MAGNESIUM: MAGNESIUM: 1.9 mg/dL (ref 1.5–2.5)

## 2015-05-22 MED ORDER — AMBULATORY NON FORMULARY MEDICATION: Status: DC

## 2015-05-22 NOTE — Progress Notes (Signed)
   Subjective:    Patient ID: MATHEW COSCARELLI, female    DOB: [birth year], 60 y.o.   MRN: [id number]  HPI Hypertension- Pt denies chest pain, SOB, dizziness, or heart palpitations.  Taking meds as directed w/o problems.  Denies medication side effects.  She is on triple therapy.  Lab Results  Component Value Date   CHOL 211* 11/21/2014   HDL 54 11/21/2014   LDLCALC 142* 11/21/2014   TRIG 75 11/21/2014   CHOLHDL 3.9 11/21/2014    Hyperthyroid - follows with Dr. Steffanie Dunn. They recently decreased her methimazole down to half a tab. She actually has a follow up appointment with him at the end of this month.  Right breast lesion-she said she went to dermatology and they recommend excision. She says it's action been healing really well and she wants to know when she can schedule her next mammogram as she know she is Artie overdue. She also complains of extreme fatigue that started a couple of months ago. She says she just wakes up feeling tired. She reports that she has been using her CPAP and feels like it's working well. She denies any excessive bleeding. No blood in the urine or stool. She no longer menstruates. She denies any sore throat. She denies any lumps or lesions on the body. No frequent headaches. No fevers or chills. He says years ago she was initially diagnosed with lupus and then later was told that she might have Sjogren's though it sounds like the diagnosis was never officially formalized. She's wondering if that could be contributing.   Review of Systems     Objective:   Physical Exam  Constitutional: She is oriented to person, place, and time. She appears well-developed and well-nourished.  HENT:  Head: Normocephalic and atraumatic.  Neck: Neck supple. Thyromegaly present.  Borderline thyromegaly   Cardiovascular: Normal rate, regular rhythm and normal heart sounds.   Pulmonary/Chest: Effort normal and breath sounds normal.  Lymphadenopathy:    She has no cervical  adenopathy.  Neurological: She is alert and oriented to person, place, and time.  Skin: Skin is warm and dry.  Psychiatric: She has a normal mood and affect. Her behavior is normal.          Assessment & Plan:  HTN - Well controlled. Continue current regimen. Follow up in 6 mo.   Fatigue-Unclear etiology but has been going on for couple months. We'll go ahead and recheck her thyroid level since her last level was about 3 months ago it certainly could have changed over that timeframe. We'll also evaluate for any sign of infection with a CBC. Evaluate for anemia including iron and B12. She would also like to have her vitamin D evaluated. Also with her history of question of Sjogren's versus lupus we'll go ahead and recheck an ANA, anti-Ro, and anti-La.  Consider decreasing betablocker as potential cause for fatigue.   Hyperthyroid - she is on half tab of methimazole.  She sees Dr. Steffanie Dunn later this month.    Due for Hep C and HIV screening.

## 2015-05-23 ENCOUNTER — Telehealth: Payer: Self-pay | Admitting: Family Medicine

## 2015-05-23 LAB — VITAMIN D 25 HYDROXY (VIT D DEFICIENCY, FRACTURES): VIT D 25 HYDROXY: 20 ng/mL — AB (ref 30–100)

## 2015-05-23 LAB — SJOGREN'S SYNDROME ANTIBODS(SSA + SSB)
SSA (RO) (ENA) ANTIBODY, IGG: POSITIVE — AB
SSB (La) (ENA) Antibody, IgG: <1

## 2015-05-23 LAB — ANA: Anti Nuclear Antibody(ANA): NEGATIVE

## 2015-05-23 NOTE — Telephone Encounter (Signed)
Call patient: I did review for her CPAP machine. She only use the machine for more than 4 hours and night 13 out of 30 days so strongly encouraged her to try to wear it for at least 4 hours per night. I know she had been sick recently and says that she wasn't using it much but this would definitely help with her fatigue issues. But otherwise when she does use it it is working well.

## 2015-05-24 NOTE — Addendum Note (Signed)
Addended by: Teddy Spike on: 05/24/2015 12:22 PM   Modules accepted: Orders

## 2015-05-24 NOTE — Addendum Note (Signed)
Addended by: Teddy Spike on: 05/24/2015 12:05 PM   Modules accepted: Orders

## 2015-05-27 NOTE — Telephone Encounter (Signed)
Spoke with Pt, states she tries to wear her CPAP every night but sometimes she only wears it for an hour or so. Recommended she wear it for minimum of 4 hours at night, Pt states she will work on it. No further questions/concerns.

## 2015-06-03 ENCOUNTER — Other Ambulatory Visit: Payer: Self-pay | Admitting: Family Medicine

## 2015-06-03 ENCOUNTER — Encounter: Payer: Self-pay | Admitting: Family Medicine

## 2015-06-07 DIAGNOSIS — E0590 Thyrotoxicosis, unspecified without thyrotoxic crisis or storm: Secondary | ICD-10-CM | POA: Diagnosis not present

## 2015-06-11 ENCOUNTER — Encounter: Payer: Self-pay | Admitting: Family Medicine

## 2015-06-13 ENCOUNTER — Telehealth: Payer: Self-pay | Admitting: *Deleted

## 2015-06-13 DIAGNOSIS — M5440 Lumbago with sciatica, unspecified side: Secondary | ICD-10-CM

## 2015-06-13 NOTE — Telephone Encounter (Signed)
Pt called and stated that she has been having trouble with balance and would like a Rx for a cane, or walker. I advised her that she does need to schedule a f/u for this however I will write a rx for her to p/u.Audelia Hives [place]

## 2015-06-17 MED ORDER — AMBULATORY NON FORMULARY MEDICATION: Status: DC

## 2015-08-05 ENCOUNTER — Encounter: Payer: Self-pay | Admitting: Physician Assistant

## 2015-08-05 ENCOUNTER — Ambulatory Visit (INDEPENDENT_AMBULATORY_CARE_PROVIDER_SITE_OTHER): Payer: Commercial Managed Care - HMO | Admitting: Physician Assistant

## 2015-08-05 VITALS — BP 133/47 | HR 61 | Temp 98.7°F | Ht 61.5 in | Wt 249.0 lb

## 2015-08-05 DIAGNOSIS — H9201 Otalgia, right ear: Secondary | ICD-10-CM

## 2015-08-05 DIAGNOSIS — R590 Localized enlarged lymph nodes: Secondary | ICD-10-CM | POA: Insufficient documentation

## 2015-08-05 MED ORDER — METHYLPREDNISOLONE SODIUM SUCC 125 MG IJ SOLR
125.0000 mg | Freq: Once | INTRAMUSCULAR | Status: AC
  Administered 2015-08-05: 125 mg via INTRAMUSCULAR

## 2015-08-05 NOTE — Progress Notes (Signed)
   Subjective:    Patient ID: Sharon Reilly, female    DOB: Jul 15, 1955, 60 y.o.   MRN: [id number]  HPI Patient is a 60 year old female who presents to the clinic with 3 days of right ear pain. She feels like her symptoms are getting worse daily. She now feels like there some lymph node enlargement on her right side under her ear and on her neck. She denies any fever, chills, body aches. She does have some mild right-sided sinus pressure and postnasal drip. She has not tried anything to make better.   Review of Systems  All other systems reviewed and are negative.      Objective:   Physical Exam  Constitutional: She appears well-developed and well-nourished.  Morbidly obese.   HENT:  Head: Normocephalic and atraumatic.  Left Ear: External ear normal.  Nose: Nose normal.  Right TM erythematous but no swelling or discharge. TM dull to light reflex with fluid behind TM.  Tenderness to palpation over right maxillary sinuses.  Nasal turbinates red and swollen.    Eyes: Conjunctivae are normal. Right eye exhibits no discharge. Left eye exhibits no discharge.  Neck: Normal range of motion. Neck supple.  Right sided lymphadenopathy, tender to palpation.   Cardiovascular: Normal rate, regular rhythm and normal heart sounds.   Pulmonary/Chest: Effort normal and breath sounds normal. She has no wheezes.  Psychiatric: She has a normal mood and affect. Her behavior is normal.          Assessment & Plan:  Right ear pain/right cervical lymphadenopathy- discussed with patient symptoms seem inflammatory and not bacterial at this time. Pt declined prednisone orally. Solumedrol 125mg  IM given in office. Use flonase 2 sprays each nostril. Call if not improving or worsening in 48-72 hours.

## 2015-08-05 NOTE — Patient Instructions (Signed)
Continue flonase. Call if not improving.

## 2015-08-13 ENCOUNTER — Ambulatory Visit (INDEPENDENT_AMBULATORY_CARE_PROVIDER_SITE_OTHER): Payer: Commercial Managed Care - HMO | Admitting: Physician Assistant

## 2015-08-13 ENCOUNTER — Encounter: Payer: Self-pay | Admitting: Physician Assistant

## 2015-08-13 VITALS — BP 131/58 | HR 55 | Ht 61.5 in | Wt 247.0 lb

## 2015-08-13 DIAGNOSIS — R110 Nausea: Secondary | ICD-10-CM | POA: Diagnosis not present

## 2015-08-13 DIAGNOSIS — K2100 Gastro-esophageal reflux disease with esophagitis, without bleeding: Secondary | ICD-10-CM

## 2015-08-13 DIAGNOSIS — R1013 Epigastric pain: Secondary | ICD-10-CM | POA: Diagnosis not present

## 2015-08-13 DIAGNOSIS — R1906 Epigastric swelling, mass or lump: Secondary | ICD-10-CM | POA: Diagnosis not present

## 2015-08-13 LAB — CBC
HEMATOCRIT: 34.9 % — AB (ref 35.0–45.0)
Hemoglobin: 11.6 g/dL — ABNORMAL LOW (ref 11.7–15.5)
MCH: 29.8 pg (ref 27.0–33.0)
MCHC: 33.2 g/dL (ref 32.0–36.0)
MCV: 89.7 fL (ref 80.0–100.0)
MPV: 10.6 fL (ref 7.5–12.5)
PLATELETS: 287 10*3/uL (ref 140–400)
RBC: 3.89 MIL/uL (ref 3.80–5.10)
RDW: 15 % (ref 11.0–15.0)
WBC: 6.5 10*3/uL (ref 3.8–10.8)

## 2015-08-13 MED ORDER — OMEPRAZOLE 40 MG PO CPDR: 40.0000 mg | DELAYED_RELEASE_CAPSULE | Freq: Two times a day (BID) | ORAL | Status: DC

## 2015-08-13 NOTE — Progress Notes (Signed)
   Subjective:    Patient ID: Sharon Reilly, female    DOB: 03/07/1955, 60 y.o.   MRN: [id number]  HPI  Pt is a 60 yo female who presents to the clinic with 6 days of epigastric pain and pressure. She is also having worsening acid reflux. She feels very full and like she cannot eat anything without nausea. She has not vomited. Even after water and a piece of toast she has symptoms. She feels acid in her throat. She is burping a lot. No melena. She did see some bright red blood on toilet paper once a few days ago. Not taking anything for acid reflux.    Review of Systems  All other systems reviewed and are negative.      Objective:   Physical Exam  Constitutional: She is oriented to person, place, and time. She appears well-developed and well-nourished.  HENT:  Head: Normocephalic and atraumatic.  Cardiovascular: Normal rate, regular rhythm and normal heart sounds.   Pulmonary/Chest: Effort normal and breath sounds normal. She has no wheezes.  No CVA tenderness.   Abdominal: Soft. Bowel sounds are normal. She exhibits no distension and no mass. There is tenderness. There is no rebound and no guarding.  Mild tenderness over epigastric area to palpation. No rebound or guarding.   Neurological: She is alert and oriented to person, place, and time.  Psychiatric: She has a normal mood and affect. Her behavior is normal.          Assessment & Plan:  Epigastric fullness/epigastric pain/nausea and vomiting/GERD- Cbc, cmp, lipase ordered today. Omeprazole 40mg  increased to twice a day. Suspect hiatal hernia. Gave HO. Discussed at times PPI can improve symptoms. Will send to GI for barium swallow/EGD.

## 2015-08-13 NOTE — Patient Instructions (Addendum)
Hiatal Hernia A hiatal hernia occurs when part of your stomach slides above the muscle that separates your abdomen from your chest (diaphragm). You can be born with a hiatal hernia (congenital), or it may develop over time. In almost all cases of hiatal hernia, only the top part of the stomach pushes through.  Many people have a hiatal hernia with no symptoms. The larger the hernia, the more likely that you will have symptoms. In some cases, a hiatal hernia allows stomach acid to flow back into the tube that carries food from your mouth to your stomach (esophagus). This may cause heartburn symptoms. Severe heartburn symptoms may mean you have developed a condition called gastroesophageal reflux disease (GERD).  CAUSES  Hiatal hernias are caused by a weakness in the opening (hiatus) where your esophagus passes through your diaphragm to attach to the upper part of your stomach. You may be born with a weakness in your hiatus, or a weakness can develop. RISK FACTORS Older age is a major risk factor for a hiatal hernia. Anything that increases pressure on your diaphragm can also increase your risk of a hiatal hernia. This includes:  Pregnancy.  Excess weight.  Frequent constipation. SIGNS AND SYMPTOMS  People with a hiatal hernia often have no symptoms. If symptoms develop, they are almost always caused by GERD. They may include:  Heartburn.  Belching.  Indigestion.  Trouble swallowing.  Coughing or wheezing.  Sore throat.  Hoarseness.  Chest pain. DIAGNOSIS  A hiatal hernia is sometimes found during an exam for another problem. Your health care provider may suspect a hiatal hernia if you have symptoms of GERD. Tests may be done to diagnose GERD. These may include:  X-rays of your stomach or chest.  An upper gastrointestinal (GI) series. This is an X-ray exam of your GI tract involving the use of a chalky liquid that you swallow. The liquid shows up clearly on the X-ray.  Endoscopy.  This is a procedure to look into your stomach using a thin, flexible tube that has a tiny camera and light on the end of it. TREATMENT  If you have no symptoms, you may not need treatment. If you have symptoms, treatment may include:  Dietary and lifestyle changes to help reduce GERD symptoms.  Medicines. These may include:  Over-the-counter antacids.  Medicines that make your stomach empty more quickly.  Medicines that block the production of stomach acid (H2 blockers).  Stronger medicines to reduce stomach acid (proton pump inhibitors).  You may need surgery to repair the hernia if other treatments are not helping. HOME CARE INSTRUCTIONS   Take all medicines as directed by your health care provider.  Quit smoking, if you smoke.  Try to achieve and maintain a healthy body weight.  Eat frequent small meals instead of three large meals a day. This keeps your stomach from getting too full.  Eat slowly.  Do not lie down right after eating.  Do noteat 1-2 hours before bed.   Do not drink beverages with caffeine. These include cola, coffee, cocoa, and tea.  Do not drink alcohol.  Avoid foods that can make symptoms of GERD worse. These may include:  Fatty foods.  Citrus fruits.  Other foods and drinks that contain acid.  Avoid putting pressure on your belly. Anything that puts pressure on your belly increases the amount of acid that may be pushed up into your esophagus.   Avoid bending over, especially after eating.  Raise the head of your bed   by putting blocks under the legs. This keeps your head and esophagus higher than your stomach.  Do not wear tight clothing around your chest or stomach.  Try not to strain when having a bowel movement, when urinating, or when lifting heavy objects. SEEK MEDICAL CARE IF:  Your symptoms are not controlled with medicines or lifestyle changes.  You are having trouble swallowing.  You have coughing or wheezing that will not  go away. SEEK IMMEDIATE MEDICAL CARE IF:  Your pain is getting worse.  Your pain spreads to your arms, neck, jaw, teeth, or back.  You have shortness of breath.  You sweat for no reason.  You feel sick to your stomach (nauseous) or vomit.  You vomit blood.  You have bright red blood in your stools.  You have black, tarry stools.    This information is not intended to replace advice given to you by your health care provider. Make sure you discuss any questions you have with your health care provider.   Document Released: 04/25/2003 Document Revised: 02/23/2014 Document Reviewed: 01/20/2013 Elsevier Interactive Patient Education 2016 [employer].   Increased prilosec 40mg  twice a day.  Make GI referral/ulcer.   Food Choices for Gastroesophageal Reflux Disease, Adult When you have gastroesophageal reflux disease (GERD), the foods you eat and your eating habits are very important. Choosing the right foods can help ease the discomfort of GERD. WHAT GENERAL GUIDELINES DO I NEED TO FOLLOW?  Choose fruits, vegetables, whole grains, low-fat dairy products, and low-fat meat, fish, and poultry.  Limit fats such as oils, salad dressings, butter, nuts, and avocado.  Keep a food diary to identify foods that cause symptoms.  Avoid foods that cause reflux. These may be different for different people.  Eat frequent small meals instead of three large meals each day.  Eat your meals slowly, in a relaxed setting.  Limit fried foods.  Cook foods using methods other than frying.  Avoid drinking alcohol.  Avoid drinking large amounts of liquids with your meals.  Avoid bending over or lying down until 2-3 hours after eating. WHAT FOODS ARE NOT RECOMMENDED? The following are some foods and drinks that may worsen your symptoms: Vegetables Tomatoes. Tomato juice. Tomato and spaghetti sauce. Chili peppers. Onion and garlic. Horseradish. Fruits Oranges, grapefruit, and lemon (fruit  and juice). Meats High-fat meats, fish, and poultry. This includes hot dogs, ribs, ham, sausage, salami, and bacon. Dairy Whole milk and chocolate milk. Sour cream. Cream. Butter. Ice cream. Cream cheese.  Beverages Coffee and tea, with or without caffeine. Carbonated beverages or energy drinks. Condiments Hot sauce. Barbecue sauce.  Sweets/Desserts Chocolate and cocoa. Donuts. Peppermint and spearmint. Fats and Oils High-fat foods, including [place] fries and potato chips. Other Vinegar. Strong spices, such as black pepper, white pepper, red pepper, cayenne, curry powder, cloves, ginger, and chili powder. The items listed above may not be a complete list of foods and beverages to avoid. Contact your dietitian for more information.   This information is not intended to replace advice given to you by your health care provider. Make sure you discuss any questions you have with your health care provider.   Document Released: 02/02/2005 Document Revised: 02/23/2014 Document Reviewed: 12/07/2012 Elsevier Interactive Patient Education [employer].

## 2015-08-14 ENCOUNTER — Other Ambulatory Visit: Payer: Self-pay | Admitting: Family Medicine

## 2015-08-14 DIAGNOSIS — R1906 Epigastric swelling, mass or lump: Secondary | ICD-10-CM | POA: Insufficient documentation

## 2015-08-14 DIAGNOSIS — K2100 Gastro-esophageal reflux disease with esophagitis, without bleeding: Secondary | ICD-10-CM | POA: Insufficient documentation

## 2015-08-14 DIAGNOSIS — R110 Nausea: Secondary | ICD-10-CM | POA: Insufficient documentation

## 2015-08-14 DIAGNOSIS — R1013 Epigastric pain: Secondary | ICD-10-CM | POA: Insufficient documentation

## 2015-08-14 LAB — COMPLETE METABOLIC PANEL WITH GFR
ALT: 13 U/L (ref 6–29)
AST: 14 U/L (ref 10–35)
Albumin: 3.6 g/dL (ref 3.6–5.1)
Alkaline Phosphatase: 94 U/L (ref 33–130)
BUN: 24 mg/dL (ref 7–25)
CO2: 28 mmol/L (ref 20–31)
Calcium: 9.3 mg/dL (ref 8.6–10.4)
Chloride: 100 mmol/L (ref 98–110)
Creat: 1.39 mg/dL — ABNORMAL HIGH (ref 0.50–0.99)
GFR, Est African American: 48 mL/min — ABNORMAL LOW (ref >=60)
GFR, Est Non African American: 41 mL/min — ABNORMAL LOW (ref >=60)
Glucose, Bld: 81 mg/dL (ref 65–99)
Potassium: 4 mmol/L (ref 3.5–5.3)
Sodium: 139 mmol/L (ref 135–146)
Total Bilirubin: 0.4 mg/dL (ref 0.2–1.2)
Total Protein: 6.4 g/dL (ref 6.1–8.1)

## 2015-08-14 LAB — LIPASE: Lipase: 26 U/L (ref 7–60)

## 2015-09-04 DIAGNOSIS — K219 Gastro-esophageal reflux disease without esophagitis: Secondary | ICD-10-CM | POA: Diagnosis not present

## 2015-09-04 DIAGNOSIS — R110 Nausea: Secondary | ICD-10-CM | POA: Diagnosis not present

## 2015-09-04 DIAGNOSIS — R109 Unspecified abdominal pain: Secondary | ICD-10-CM | POA: Diagnosis not present

## 2015-09-04 DIAGNOSIS — Z86010 Personal history of colonic polyps: Secondary | ICD-10-CM | POA: Diagnosis not present

## 2015-10-10 DIAGNOSIS — Z86010 Personal history of colonic polyps: Secondary | ICD-10-CM | POA: Diagnosis not present

## 2015-10-10 LAB — HM COLONOSCOPY

## 2015-10-14 ENCOUNTER — Encounter: Payer: Self-pay | Admitting: Physician Assistant

## 2015-10-14 DIAGNOSIS — K648 Other hemorrhoids: Secondary | ICD-10-CM | POA: Insufficient documentation

## 2015-11-04 ENCOUNTER — Telehealth: Payer: Self-pay | Admitting: *Deleted

## 2015-11-04 ENCOUNTER — Other Ambulatory Visit: Payer: Self-pay | Admitting: *Deleted

## 2015-11-04 DIAGNOSIS — M5440 Lumbago with sciatica, unspecified side: Secondary | ICD-10-CM

## 2015-11-04 DIAGNOSIS — M1610 Unilateral primary osteoarthritis, unspecified hip: Secondary | ICD-10-CM | POA: Diagnosis not present

## 2015-11-04 MED ORDER — AMBULATORY NON FORMULARY MEDICATION: 0 refills | Status: DC

## 2015-11-04 NOTE — Telephone Encounter (Signed)
Patient called and states she has been trying to get the order filled for the rolling walker but has been unsuccessfully. Patient [facility] the [facility] at [phone]. Order faxed

## 2015-11-04 NOTE — Telephone Encounter (Signed)
Order faxed.

## 2015-11-12 ENCOUNTER — Other Ambulatory Visit: Payer: Self-pay | Admitting: Family Medicine

## 2015-11-15 ENCOUNTER — Other Ambulatory Visit: Payer: Self-pay | Admitting: Family Medicine

## 2015-11-15 NOTE — Telephone Encounter (Signed)
What is the question  About this one?

## 2015-11-21 ENCOUNTER — Encounter: Payer: Self-pay | Admitting: Family Medicine

## 2015-11-21 ENCOUNTER — Ambulatory Visit (INDEPENDENT_AMBULATORY_CARE_PROVIDER_SITE_OTHER): Payer: Commercial Managed Care - HMO | Admitting: Family Medicine

## 2015-11-21 VITALS — BP 149/62 | HR 60 | Ht 61.5 in | Wt 253.0 lb

## 2015-11-21 DIAGNOSIS — R238 Other skin changes: Secondary | ICD-10-CM | POA: Diagnosis not present

## 2015-11-21 DIAGNOSIS — I10 Essential (primary) hypertension: Secondary | ICD-10-CM

## 2015-11-21 DIAGNOSIS — Z23 Encounter for immunization: Secondary | ICD-10-CM

## 2015-11-21 DIAGNOSIS — R7301 Impaired fasting glucose: Secondary | ICD-10-CM | POA: Diagnosis not present

## 2015-11-21 LAB — POCT GLYCOSYLATED HEMOGLOBIN (HGB A1C): Hemoglobin A1C: 5.9

## 2015-11-21 MED ORDER — BETAMETHASONE DIPROPIONATE 0.05 % EX CREA: TOPICAL_CREAM | Freq: Two times a day (BID) | CUTANEOUS | 0 refills | Status: DC

## 2015-11-21 NOTE — Progress Notes (Signed)
Subjective:    CC: HTN   HPI:  Hypertension- Pt denies chest pain, SOB, dizziness, or heart palpitations.  Taking meds as directed w/o problems.  Denies medication side effects.    IFG - No inc thirst or urination.   She also has a rash around her ankles. She says it's been coming and going for years. Typically when it breaks out it looks more vesicular and then she pops them they dry up and come hyperpigmented. She says the whole episode typically last for about a month and it's usually very itchy and burns. Her episode started about 2-3 weeks ago.  Past medical history, Surgical history, Family history not pertinant except as noted below, Social history, Allergies, and medications have been entered into the medical record, reviewed, and corrections made.   Review of Systems: No fevers, chills, night sweats, weight loss, chest pain, or shortness of breath.   Objective:    General: Well Developed, well nourished, and in no acute distress.  Neuro: Alert and oriented x3, extra-ocular muscles intact, sensation grossly intact.  HEENT: Normocephalic, atraumatic  Skin: Warm and dry, no rashes. Cardiac: Regular rate and rhythm, no murmurs rubs or gallops, no lower extremity edema.  Respiratory: Clear to auscultation bilaterally. Not using accessory muscles, speaking in full sentences.       Impression and Recommendations:    HTN - Well controlled. Continue current regimen. Follow up in  6 months.   IFG - A1C of 5.9 today. Indeed work on diet and exercise and follow-up in 6 months.  Vesicular rash-most of them are in the healing phase and there are no vesicles today to incise and get a culture from. I did ask her to come in the next time it breaks out so that we can examine it in its early phase and to not pop them before she comes in to the we can culture them. She may eventually need a biopsy.

## 2015-11-26 ENCOUNTER — Ambulatory Visit: Payer: Commercial Managed Care - HMO

## 2015-11-28 ENCOUNTER — Other Ambulatory Visit: Payer: Self-pay | Admitting: Family Medicine

## 2015-11-28 ENCOUNTER — Other Ambulatory Visit: Payer: Self-pay | Admitting: Physician Assistant

## 2015-11-29 ENCOUNTER — Ambulatory Visit (INDEPENDENT_AMBULATORY_CARE_PROVIDER_SITE_OTHER): Payer: Commercial Managed Care - HMO

## 2015-11-29 DIAGNOSIS — Z1231 Encounter for screening mammogram for malignant neoplasm of breast: Secondary | ICD-10-CM | POA: Diagnosis not present

## 2015-12-09 DIAGNOSIS — E559 Vitamin D deficiency, unspecified: Secondary | ICD-10-CM | POA: Diagnosis not present

## 2015-12-09 DIAGNOSIS — E0590 Thyrotoxicosis, unspecified without thyrotoxic crisis or storm: Secondary | ICD-10-CM | POA: Diagnosis not present

## 2015-12-24 DIAGNOSIS — E7800 Pure hypercholesterolemia, unspecified: Secondary | ICD-10-CM | POA: Diagnosis not present

## 2015-12-24 DIAGNOSIS — I10 Essential (primary) hypertension: Secondary | ICD-10-CM | POA: Diagnosis not present

## 2015-12-24 DIAGNOSIS — H5210 Myopia, unspecified eye: Secondary | ICD-10-CM | POA: Diagnosis not present

## 2015-12-24 DIAGNOSIS — H25013 Cortical age-related cataract, bilateral: Secondary | ICD-10-CM | POA: Diagnosis not present

## 2015-12-24 DIAGNOSIS — H35382 Toxic maculopathy, left eye: Secondary | ICD-10-CM | POA: Diagnosis not present

## 2015-12-24 DIAGNOSIS — Z0100 Encounter for examination of eyes and vision without abnormal findings: Secondary | ICD-10-CM | POA: Diagnosis not present

## 2016-02-04 ENCOUNTER — Encounter: Payer: Self-pay | Admitting: Family Medicine

## 2016-02-04 ENCOUNTER — Ambulatory Visit (INDEPENDENT_AMBULATORY_CARE_PROVIDER_SITE_OTHER): Payer: Commercial Managed Care - HMO | Admitting: Family Medicine

## 2016-02-04 VITALS — BP 132/74 | HR 56 | Ht 62.0 in | Wt 256.0 lb

## 2016-02-04 DIAGNOSIS — R05 Cough: Secondary | ICD-10-CM

## 2016-02-04 DIAGNOSIS — R059 Cough, unspecified: Secondary | ICD-10-CM

## 2016-02-04 DIAGNOSIS — R070 Pain in throat: Secondary | ICD-10-CM

## 2016-02-04 NOTE — Progress Notes (Signed)
Subjective:    Patient ID: Sharon Reilly, female    DOB: 05-14-1955, 60 y.o.   MRN: [id number]  HPI 57-year-old female comes in today complaining of issues with her throat for the last 2-3 months. She seeing Gabriel Cirri and says that more recently she's noticed that she is having more difficulty. When she sings it's actually causing pain in her throat. Just eating and drinking does not typically bother her. That she has noticed when she tries to swallow capsules she feels like they're getting stuck. It doesn't happen with any other foods or medications. She says she has been thinking higher notes then she typically does but does not alter the frequency of her singing. She said she's never had problems before. She does have a history of reflux but says that her symptoms have actually been really well controlled. And she takes her omeprazole every day. Is not had any recent upper respiratory or cold symptoms. No postnasal drip. She thought initially it could be allergies but the symptoms have persisted which is why she is coming in today.He has had a dry intermittent cough as well.   Review of Systems   BP 132/74   Pulse (!) 56   Ht 5\' 2"  (1.575 m)   Wt 256 lb (116.1 kg)   SpO2 100%   BMI 46.82 kg/m     Allergies  Allergen Reactions  . Amoxicillin Itching    No rash.   . Erythromycin   . Sulfonamide Derivatives   . Doxycycline Rash  . Sulfa Antibiotics Rash    Past Medical History:  Diagnosis Date  . Fibromyalgia   . Hyperlipidemia   . Hypertension   . Lupus   . Lupus (systemic lupus erythematosus) ([place])   . Obesity   . Osteoarthritis   . Osteoporosis     Past Surgical History:  Procedure Laterality Date  . ABDOMINAL HYSTERECTOMY     partial  . BREAST SURGERY     reduction  . CARPAL TUNNEL RELEASE     left  . epicondylitis     left  . fistulotomy    . great toe fusion     left  . MRI syr ortho spec     Dr. Christen Butter  . right elbow    . surgisis biodesign fistula plug     . talonavicular joint fusion     LT foot    Social History   Social History  . Marital status: Single    Spouse name: N/A  . Number of children: N/A  . Years of education: N/A   Occupational History  . Not on file.   Social History Main Topics  . Smoking status: Never Smoker  . Smokeless tobacco: Not on file  . Alcohol use No  . Drug use: No  . Sexual activity: Not on file   Other Topics Concern  . Not on file   Social History Narrative  . No narrative on file    Family History  Problem Relation Age of Onset  . Breast cancer Mother   . Hyperlipidemia Mother   . Hypertension Mother   . Hyperlipidemia Father   . Diabetes Brother   . Diabetes      aunt  . Kidney failure Brother   . Thyroid disease Sister     Outpatient Encounter Prescriptions as of 02/04/2016  Medication Sig  . acetaminophen (TYLENOL) 325 MG tablet Take 650 mg by mouth.  . AMBULATORY NON FORMULARY MEDICATION Medication Name: Call  [facility] to get a download off her CPAP and fax to our office.  [phone]  . AMBULATORY NON FORMULARY MEDICATION Medication Name: roll about walker. Dx. M54.42  Lumbago with sciatica  . aspirin 81 MG tablet Take 81 mg by mouth daily.    . betamethasone dipropionate (DIPROLENE) 0.05 % cream Apply topically 2 (two) times daily.  . fexofenadine (ALLEGRA) 180 MG tablet Take 1 tablet (180 mg total) by mouth daily.  [name] losartan-hydrochlorothiazide (HYZAAR) 100-25 MG tablet take 1 tablet by mouth once daily  . methimazole (TAPAZOLE) 5 MG tablet Take 2.5 mg by mouth daily.   . metoprolol (LOPRESSOR) 100 MG tablet take 1 tablet by mouth twice a day  . naproxen sodium (ANAPROX) 220 MG tablet Take 220 mg by mouth.  [name] omeprazole (PRILOSEC) 40 MG capsule take 1 capsule by mouth twice a day  . simvastatin (ZOCOR) 20 MG tablet take 1 tablet by mouth at bedtime   No facility-administered encounter medications on file as of 02/04/2016.          Objective:   Physical Exam   Constitutional: She is oriented to person, place, and time. She appears well-developed and well-nourished.  HENT:  Head: Normocephalic and atraumatic.  Right Ear: External ear normal.  Left Ear: External ear normal.  Nose: Nose normal.  Mouth/Throat: Oropharynx is clear and moist.  TMs and canals are clear.   Eyes: Conjunctivae and EOM are normal. Pupils are equal, round, and reactive to light.  Neck: Neck supple. No thyromegaly present.  Cardiovascular: Normal rate, regular rhythm and normal heart sounds.   Pulmonary/Chest: Effort normal and breath sounds normal. She has no wheezes.  Lymphadenopathy:    She has no cervical adenopathy.  Neurological: She is alert and oriented to person, place, and time.  Skin: Skin is warm and dry.  Psychiatric: She has a normal mood and affect.       Assessment & Plan:  Sore throat-suspect related to her vocal cords. Explained that it could be reflux related that she is Artie on a daily PPI. Could be a nodule or polyp on the vocal cords or mass. I would like to refer her to ENT for further evaluation. Explained that really she just seems to have the vocal cords visualized so that the correct treatment plan is in place.  Cough is mild and dry. Unsure if related to the throat issues or not. But if it persists after seeing ENT them please let me know.

## 2016-02-18 ENCOUNTER — Other Ambulatory Visit: Payer: Self-pay | Admitting: Family Medicine

## 2016-03-02 ENCOUNTER — Other Ambulatory Visit: Payer: Self-pay

## 2016-03-02 MED ORDER — OMEPRAZOLE 40 MG PO CPDR: 40.0000 mg | DELAYED_RELEASE_CAPSULE | Freq: Two times a day (BID) | ORAL | 1 refills | Status: DC

## 2016-03-08 DIAGNOSIS — I10 Essential (primary) hypertension: Secondary | ICD-10-CM | POA: Diagnosis not present

## 2016-03-08 DIAGNOSIS — M797 Fibromyalgia: Secondary | ICD-10-CM | POA: Diagnosis not present

## 2016-03-08 DIAGNOSIS — K219 Gastro-esophageal reflux disease without esophagitis: Secondary | ICD-10-CM | POA: Diagnosis not present

## 2016-03-08 DIAGNOSIS — I517 Cardiomegaly: Secondary | ICD-10-CM | POA: Diagnosis not present

## 2016-03-08 DIAGNOSIS — I708 Atherosclerosis of other arteries: Secondary | ICD-10-CM | POA: Diagnosis not present

## 2016-03-08 DIAGNOSIS — Z9981 Dependence on supplemental oxygen: Secondary | ICD-10-CM | POA: Diagnosis not present

## 2016-03-08 DIAGNOSIS — R079 Chest pain, unspecified: Secondary | ICD-10-CM | POA: Diagnosis not present

## 2016-03-08 DIAGNOSIS — M79602 Pain in left arm: Secondary | ICD-10-CM | POA: Diagnosis not present

## 2016-03-08 DIAGNOSIS — Z6841 Body Mass Index (BMI) 40.0 and over, adult: Secondary | ICD-10-CM | POA: Diagnosis not present

## 2016-03-08 DIAGNOSIS — I214 Non-ST elevation (NSTEMI) myocardial infarction: Secondary | ICD-10-CM

## 2016-03-08 DIAGNOSIS — M329 Systemic lupus erythematosus, unspecified: Secondary | ICD-10-CM | POA: Diagnosis not present

## 2016-03-08 DIAGNOSIS — I771 Stricture of artery: Secondary | ICD-10-CM | POA: Diagnosis not present

## 2016-03-08 DIAGNOSIS — I2510 Atherosclerotic heart disease of native coronary artery without angina pectoris: Secondary | ICD-10-CM | POA: Diagnosis not present

## 2016-03-08 DIAGNOSIS — R0789 Other chest pain: Secondary | ICD-10-CM | POA: Diagnosis not present

## 2016-03-08 DIAGNOSIS — E785 Hyperlipidemia, unspecified: Secondary | ICD-10-CM | POA: Diagnosis not present

## 2016-03-08 DIAGNOSIS — G4733 Obstructive sleep apnea (adult) (pediatric): Secondary | ICD-10-CM | POA: Diagnosis not present

## 2016-03-08 DIAGNOSIS — I519 Heart disease, unspecified: Secondary | ICD-10-CM | POA: Diagnosis not present

## 2016-03-08 DIAGNOSIS — I071 Rheumatic tricuspid insufficiency: Secondary | ICD-10-CM | POA: Diagnosis not present

## 2016-03-08 DIAGNOSIS — R748 Abnormal levels of other serum enzymes: Secondary | ICD-10-CM | POA: Diagnosis not present

## 2016-03-08 HISTORY — DX: Non-ST elevation (NSTEMI) myocardial infarction: I21.4

## 2016-03-09 ENCOUNTER — Encounter: Payer: Self-pay | Admitting: Family Medicine

## 2016-03-09 DIAGNOSIS — I2510 Atherosclerotic heart disease of native coronary artery without angina pectoris: Secondary | ICD-10-CM | POA: Insufficient documentation

## 2016-03-23 ENCOUNTER — Ambulatory Visit: Payer: Commercial Managed Care - HMO | Admitting: Family Medicine

## 2016-03-23 ENCOUNTER — Encounter: Payer: Self-pay | Admitting: Family Medicine

## 2016-03-23 ENCOUNTER — Ambulatory Visit (INDEPENDENT_AMBULATORY_CARE_PROVIDER_SITE_OTHER): Payer: Commercial Managed Care - HMO | Admitting: Family Medicine

## 2016-03-23 VITALS — BP 124/68 | HR 64 | Temp 97.9°F | Ht 62.0 in | Wt 247.0 lb

## 2016-03-23 VITALS — BP 124/68 | HR 64 | Ht 62.0 in | Wt 247.0 lb

## 2016-03-23 DIAGNOSIS — I214 Non-ST elevation (NSTEMI) myocardial infarction: Secondary | ICD-10-CM | POA: Diagnosis not present

## 2016-03-23 DIAGNOSIS — S0300XA Dislocation of jaw, unspecified side, initial encounter: Secondary | ICD-10-CM | POA: Diagnosis not present

## 2016-03-23 DIAGNOSIS — H9202 Otalgia, left ear: Secondary | ICD-10-CM

## 2016-03-23 DIAGNOSIS — Z780 Asymptomatic menopausal state: Secondary | ICD-10-CM

## 2016-03-23 DIAGNOSIS — Z0000 Encounter for general adult medical examination without abnormal findings: Secondary | ICD-10-CM

## 2016-03-23 DIAGNOSIS — I10 Essential (primary) hypertension: Secondary | ICD-10-CM

## 2016-03-23 DIAGNOSIS — Z6841 Body Mass Index (BMI) 40.0 and over, adult: Secondary | ICD-10-CM

## 2016-03-23 DIAGNOSIS — K2100 Gastro-esophageal reflux disease with esophagitis, without bleeding: Secondary | ICD-10-CM

## 2016-03-23 MED ORDER — AMBULATORY NON FORMULARY MEDICATION: 0 refills | Status: DC

## 2016-03-23 NOTE — Progress Notes (Signed)
Agree with note below.   Sharon Lecher, MD

## 2016-03-23 NOTE — Progress Notes (Addendum)
Subjective:    Patient ID: Sharon Reilly, female    DOB: 1955-07-23, 61 y.o.   MRN: [id number]  HPI History of female here today for hospital follow-up. She went to the emergency department on January 21 and was diagnosed with a non-ST elevated MI. She had a cardiac catheterization the following day on July 22. She did not have any significant obstructive disease in her coronaries. She did have an elevated end-diastolic pressure. They added hydralazine to better optimize her blood pressure. And recommended aggressive blood pressure control.They started her on hydralazine. She said she is not taking it. She says she's been shot monitoring her blood pressure at home and his looked fantastic so she has held the medication.  Has feeling well since she's been home. She says she really feels like this was more reflux than anything and in fact has an appointment on Wednesday with her GI doctor. She says sometimes when her reflux axilla it's very severe and causes a lot of discomfort and pain. She's also noticing that she's waking up with a sore throat in the morning and she's also had an unexplained cough. She does have a follow-up with cardiology in about 2 weeks.  She also complains of left ear pain. She says it's been bothering her for about 2 weeks. That she's also had some pain into the jaw area. She reports a history of teeth grinding notices that chewing seems to aggravate her pain. She's not had any fevers chills or drainage from the ear.   Review of Systems  BP 124/68   Pulse 64   Ht 5\' 2"  (1.575 m)   Wt 247 lb (112 kg)   SpO2 100%   BMI 45.18 kg/m     Allergies  Allergen Reactions  . Amoxicillin Hives and Itching    No rash.   . Erythromycin Hives  . Sulfonamide Derivatives   . Doxycycline Hives  . Sulfa Antibiotics Hives and Rash    Past Medical History:  Diagnosis Date  . Fibromyalgia   . Hyperlipidemia   . Hypertension   . Lupus   . Lupus (systemic lupus erythematosus)  ([place])   . NSTEMI (non-ST elevated myocardial infarction) ([place]) 03/08/2016   [facility]  . Obesity   . Osteoarthritis   . Osteoporosis     Past Surgical History:  Procedure Laterality Date  . ABDOMINAL HYSTERECTOMY     partial  . BREAST SURGERY     reduction  . CARPAL TUNNEL RELEASE     left  . epicondylitis     left  . fistulotomy    . great toe fusion     left  . MRI syr ortho spec     Dr. Christen Butter  . right elbow    . surgisis biodesign fistula plug    . talonavicular joint fusion     LT foot    Social History   Social History  . Marital status: Single    Spouse name: N/A  . Number of children: N/A  . Years of education: N/A   Occupational History  . Not on file.   Social History Main Topics  . Smoking status: Never Smoker  . Smokeless tobacco: Never Used  . Alcohol use No  . Drug use: No  . Sexual activity: No   Other Topics Concern  . Not on file   Social History Narrative  . No narrative on file    Family History  Problem Relation Age of Onset  .  Breast cancer Mother     Diagnosed in her 54's.  [name] Hyperlipidemia Mother   . Hypertension Mother   . Hyperlipidemia Father   . Diabetes Brother   . Diabetes      aunt  . Kidney failure Brother   . Thyroid disease Sister     Outpatient Encounter Prescriptions as of 03/23/2016  Medication Sig  . acetaminophen (TYLENOL) 325 MG tablet Take 650 mg by mouth.  . AMBULATORY NON FORMULARY MEDICATION Medication Name: Call [facility] to get a download off her CPAP and fax to our office.  [phone]  . AMBULATORY NON FORMULARY MEDICATION Medication Name: roll about walker. Dx. M54.42  Lumbago with sciatica  . aspirin 81 MG tablet Take 81 mg by mouth daily.    . fexofenadine (ALLEGRA) 180 MG tablet Take 1 tablet (180 mg total) by mouth daily. (Patient not taking: Reported on 03/23/2016)  . hydrALAZINE (APRESOLINE) 25 MG tablet   . losartan-hydrochlorothiazide (HYZAAR) 100-25 MG tablet take 1 tablet by mouth once daily  .  methimazole (TAPAZOLE) 5 MG tablet Take 2.5 mg by mouth daily.   . metoprolol (LOPRESSOR) 100 MG tablet take 1 tablet by mouth twice a day  . naproxen sodium (ANAPROX) 220 MG tablet Take 220 mg by mouth.  [name] omeprazole (PRILOSEC) 40 MG capsule Take 1 capsule (40 mg total) by mouth 2 (two) times daily.  . simvastatin (ZOCOR) 20 MG tablet Take 1 tablet (20 mg total) by mouth at bedtime. *ADDITIONAL REFILLS WILL REQUIRE LABS. PLEASE CONTACT OFFICE TO GET THESE DONE*  . Vitamin D, Ergocalciferol, (DRISDOL) 50000 units CAPS capsule   . AMBULATORY NON FORMULARY MEDICATION Medication Name: blood pressure cuff Dx:I10  . [DISCONTINUED] betamethasone dipropionate (DIPROLENE) 0.05 % cream Apply topically 2 (two) times daily.   No facility-administered encounter medications on file as of 03/23/2016.          Objective:   Physical Exam  Constitutional: She is oriented to person, place, and time. She appears well-developed and well-nourished.  HENT:  Head: Normocephalic and atraumatic.  Right Ear: External ear normal.  Left Ear: External ear normal.  Nose: Nose normal.  Mouth/Throat: Oropharynx is clear and moist.  TMs and canals are clear.   Eyes: Conjunctivae and EOM are normal. Pupils are equal, round, and reactive to light.  Neck: Neck supple. No thyromegaly present.  Cardiovascular: Normal rate, regular rhythm and normal heart sounds.   Pulmonary/Chest: Effort normal and breath sounds normal. She has no wheezes.  Lymphadenopathy:    She has no cervical adenopathy.  Neurological: She is alert and oriented to person, place, and time.  Skin: Skin is warm and dry.  Psychiatric: She has a normal mood and affect. Her behavior is normal.          Assessment & Plan:  Non-ST elevated MI without any significant coronary obstruction on catheterization. She is doing well. Blood pressure looks fantastic.  Hypertension-her blood pressure looks fantastic today. I did encourage her to at least hold  onto the hydralazine. The cardiologist may want her to continue it because she did have some elevated end-diastolic pressures which case they may need to actually lower her losartan if they're going to keep her on it that her blood pressures look absolutely wonderful here and have a home for the last several days.  GERD with esophagitis-has follow-up with GI in 2 days.  Left ear pain - exam is normal. I think her pain is most consistent with TMJ. Next  TMJ-discussed treatment  options. Avoid  clenching the jaw. Recommend gentle stretches. Can use a heating pad or ice if needed. If not improving over the next couple weeks and consider volume at bedtime. Since she also knows that she grinds her teeth at night she really should look into some type of dental guard. She can either get something over-the-counter or speak with her dentist about a custom-made device.

## 2016-03-23 NOTE — Patient Instructions (Signed)
I have personally reviewed and addressed the Medicare Annual Wellness questionnaire and have noted the following in the patient's chart:  A. Medical and social history B. Use of alcohol, tobacco or illicit drugs  C. Current medications and supplements D. Functional ability and status E.  Nutritional status F.  Physical activity G. Advance directives H. List of other physicians I.  Hospitalizations, surgeries, and ER visits in previous 12 months J.  [place] to include hearing, vision, cognitive, depression L. Referrals and appointments - none  In addition, I have reviewed and discussed with patient certain preventive protocols, quality metrics, and best practice recommendations. A written personalized care plan for preventive services as well as general preventive health recommendations were provided to patient.  Signed,   Nestor Lewandowsky, RN

## 2016-03-23 NOTE — Progress Notes (Signed)
Subjective:   Sharon Reilly is a 61 y.o. female who presents for an Initial Medicare Annual Wellness Visit.  Review of Systems      Cardiac Risk Factors include: dyslipidemia;hypertension;obesity (BMI >30kg/m2);sedentary lifestyle     Objective:    Today's Vitals   03/23/16 1128 03/23/16 1129  BP: 124/68   Pulse: 64   Temp: 97.9 F (36.6 C)   TempSrc: Oral   SpO2: 100%   Weight: 247 lb (112 kg)   Height: 5\' 2"  (1.575 m)   PainSc:  2    Body mass index is 45.18 kg/m.   Current Medications (verified) Outpatient Encounter Prescriptions as of 03/23/2016  Medication Sig  . acetaminophen (TYLENOL) 325 MG tablet Take 650 mg by mouth.  . AMBULATORY NON FORMULARY MEDICATION Medication Name: Call Huey Romans to get a download off her CPAP and fax to our office.  [phone]  . AMBULATORY NON FORMULARY MEDICATION Medication Name: roll about walker. Dx. M54.42  Lumbago with sciatica  . aspirin 81 MG tablet Take 81 mg by mouth daily.    . fluticasone (VERAMYST) 27.5 MCG/SPRAY nasal spray Place 2 sprays into the nose 3 (three) times daily as needed for rhinitis.  [name] losartan-hydrochlorothiazide (HYZAAR) 100-25 MG tablet take 1 tablet by mouth once daily  . methimazole (TAPAZOLE) 5 MG tablet Take 2.5 mg by mouth daily.   . metoprolol (LOPRESSOR) 100 MG tablet take 1 tablet by mouth twice a day  . naproxen sodium (ANAPROX) 220 MG tablet Take 220 mg by mouth.  [name] omeprazole (PRILOSEC) 40 MG capsule Take 1 capsule (40 mg total) by mouth 2 (two) times daily.  . simvastatin (ZOCOR) 20 MG tablet Take 1 tablet (20 mg total) by mouth at bedtime. *ADDITIONAL REFILLS WILL REQUIRE LABS. PLEASE CONTACT OFFICE TO GET THESE DONE*  . fexofenadine (ALLEGRA) 180 MG tablet Take 1 tablet (180 mg total) by mouth daily. (Patient not taking: Reported on 03/23/2016)  . hydrALAZINE (APRESOLINE) 25 MG tablet   . Vitamin D, Ergocalciferol, (DRISDOL) 50000 units CAPS capsule   . [DISCONTINUED] betamethasone dipropionate  (DIPROLENE) 0.05 % cream Apply topically 2 (two) times daily.   No facility-administered encounter medications on file as of 03/23/2016.     Allergies (verified) Amoxicillin; Erythromycin; Sulfonamide derivatives; Doxycycline; and Sulfa antibiotics   History: Past Medical History:  Diagnosis Date  . Fibromyalgia   . Hyperlipidemia   . Hypertension   . Lupus   . Lupus (systemic lupus erythematosus) ([place])   . NSTEMI (non-ST elevated myocardial infarction) ([place]) 03/08/2016   [facility]  . Obesity   . Osteoarthritis   . Osteoporosis    Past Surgical History:  Procedure Laterality Date  . ABDOMINAL HYSTERECTOMY     partial  . BREAST SURGERY     reduction  . CARPAL TUNNEL RELEASE     left  . epicondylitis     left  . fistulotomy    . great toe fusion     left  . MRI syr ortho spec     Dr. Christen Butter  . right elbow    . surgisis biodesign fistula plug    . talonavicular joint fusion     LT foot   Family History  Problem Relation Age of Onset  . Breast cancer Mother     Diagnosed in her 73's.  [name] Hyperlipidemia Mother   . Hypertension Mother   . Hyperlipidemia Father   . Diabetes Brother   . Diabetes      aunt  .  Kidney failure Brother   . Thyroid disease Sister    Social History   Occupational History  . Not on file.   Social History Main Topics  . Smoking status: Never Smoker  . Smokeless tobacco: Never Used  . Alcohol use No  . Drug use: No  . Sexual activity: No    Tobacco Counseling Counseling given: Not Answered   Activities of Daily Living In your present state of health, do you have any difficulty performing the following activities: 03/23/2016  Hearing? N  Vision? N  Difficulty concentrating or making decisions? N  Walking or climbing stairs? Y  Dressing or bathing? N  Doing errands, shopping? N  Preparing Food and eating ? N  Using the Toilet? N  In the past six months, have you accidently leaked urine? N  Do you have problems with loss of bowel  control? N  Managing your Medications? N  Managing your Finances? N  Housekeeping or managing your Housekeeping? N  Some recent data might be hidden    Immunizations and Health Maintenance Immunization History  Administered Date(s) Administered  . Influenza Split 12/24/2010  . Influenza,inj,Quad PF,36+ Mos 11/29/2012, 10/04/2013, 11/16/2014, 11/21/2015  . Td 07/04/2010   There are no preventive care reminders to display for this patient.  Patient Care Team: Hali Marry, MD as PCP - General (Family Medicine)  Indicate any recent Medical Services you may have received from other than [facility] providers in the past year (date may be approximate).     Assessment:   This is a routine wellness examination for [place].   Hearing/Vision screen No exam data present  Dietary issues and exercise activities discussed: Current Exercise Habits: The patient does not participate in regular exercise at present, Exercise limited by: cardiac condition(s);orthopedic condition(s);Other - see comments (Pt. reports that she feels off balance at times.)  Goals    . Exercise 3x per week (30 min per time) (pt-stated)          She would like to participate in a structured exercise program such as Silver Sneakers or water aerobics a few times per week at the [facility].      Depression Screen PHQ 2/9 Scores 03/23/2016  PHQ - 2 Score 0    Fall Risk Fall Risk  03/23/2016  Falls in the past year? No  Risk for fall due to : Impaired balance/gait;Impaired mobility    Cognitive Function:     6CIT Screen 03/23/2016  What Year? 0 points  What month? 0 points  What time? 0 points  Months in reverse 0 points  Repeat phrase 0 points    Screening Tests Health Maintenance  Topic Date Due  . COLONOSCOPY  03/23/2017 (Originally 08/17/2013)  . ZOSTAVAX  03/23/2017 (Originally 07/06/2015)  . Hepatitis C Screening  03/23/2017 (Originally 10/26/1955)  . HIV Screening  03/23/2017 (Originally 07[birth year]1970)  .  MAMMOGRAM  11/28/2017  . TETANUS/TDAP  07/03/2020  . INFLUENZA VACCINE  Completed      Plan:      I have personally reviewed and addressed the Medicare Annual Wellness questionnaire and have noted the following in the patient's chart:  A. Medical and social history  C. Current medications and supplements D. Functional ability and status E.  Nutritional status F.  Physical activity   I.  Hospitalizations, surgeries, and ER visits in previous 12 months J.  [place] to include hearing, vision, cognitive, depression L. Referrals and appointments - none  In addition, I have reviewed and  discussed with patient certain preventive protocols, quality metrics, and best practice recommendations. A written personalized care plan for preventive services as well as general preventive health recommendations were provided to patient.  Signed,   METHENEY,CATHERINE, MD         Subjective:   Sharon Reilly is a 61 y.o. female who presents for an Initial Medicare Annual Wellness Visit.  Review of Systems    Comprehensive ROS is negative.   Cardiac Risk Factors include: dyslipidemia;hypertension;obesity (BMI >30kg/m2);sedentary lifestyle     Objective:    Today's Vitals   03/23/16 1128 03/23/16 1129  BP: 124/68   Pulse: 64   Temp: 97.9 F (36.6 C)   TempSrc: Oral   SpO2: 100%   Weight: 247 lb (112 kg)   Height: 5\' 2"  (1.575 m)   PainSc:  2    Body mass index is 45.18 kg/m.   Current Medications (verified) Outpatient Encounter Prescriptions as of 03/23/2016  Medication Sig  . acetaminophen (TYLENOL) 325 MG tablet Take 650 mg by mouth.  . AMBULATORY NON FORMULARY MEDICATION Medication Name: Call Huey Romans to get a download off her CPAP and fax to our office.  [phone]  . AMBULATORY NON FORMULARY MEDICATION Medication Name: roll about walker. Dx. M54.42  Lumbago with sciatica  . aspirin 81 MG tablet Take 81 mg by mouth daily.    . fluticasone (VERAMYST) 27.5  MCG/SPRAY nasal spray Place 2 sprays into the nose 3 (three) times daily as needed for rhinitis.  [name] losartan-hydrochlorothiazide (HYZAAR) 100-25 MG tablet take 1 tablet by mouth once daily  . methimazole (TAPAZOLE) 5 MG tablet Take 2.5 mg by mouth daily.   . metoprolol (LOPRESSOR) 100 MG tablet take 1 tablet by mouth twice a day  . naproxen sodium (ANAPROX) 220 MG tablet Take 220 mg by mouth.  [name] omeprazole (PRILOSEC) 40 MG capsule Take 1 capsule (40 mg total) by mouth 2 (two) times daily.  . simvastatin (ZOCOR) 20 MG tablet Take 1 tablet (20 mg total) by mouth at bedtime. *ADDITIONAL REFILLS WILL REQUIRE LABS. PLEASE CONTACT OFFICE TO GET THESE DONE*  . fexofenadine (ALLEGRA) 180 MG tablet Take 1 tablet (180 mg total) by mouth daily. (Patient not taking: Reported on 03/23/2016)  . hydrALAZINE (APRESOLINE) 25 MG tablet   . Vitamin D, Ergocalciferol, (DRISDOL) 50000 units CAPS capsule   . [DISCONTINUED] betamethasone dipropionate (DIPROLENE) 0.05 % cream Apply topically 2 (two) times daily.   No facility-administered encounter medications on file as of 03/23/2016.     Allergies (verified) Amoxicillin; Erythromycin; Sulfonamide derivatives; Doxycycline; and Sulfa antibiotics   History: Past Medical History:  Diagnosis Date  . Fibromyalgia   . Hyperlipidemia   . Hypertension   . Lupus   . Lupus (systemic lupus erythematosus) ([place])   . NSTEMI (non-ST elevated myocardial infarction) ([place]) 03/08/2016   [facility]  . Obesity   . Osteoarthritis   . Osteoporosis    Past Surgical History:  Procedure Laterality Date  . ABDOMINAL HYSTERECTOMY     partial  . BREAST SURGERY     reduction  . CARPAL TUNNEL RELEASE     left  . epicondylitis     left  . fistulotomy    . great toe fusion     left  . MRI syr ortho spec     Dr. Christen Butter  . right elbow    . surgisis biodesign fistula plug    . talonavicular joint fusion     LT foot   Family History  Problem Relation Age of Onset  . Breast cancer  Mother     Diagnosed in her 42's.  [name] Hyperlipidemia Mother   . Hypertension Mother   . Hyperlipidemia Father   . Diabetes Brother   . Diabetes      aunt  . Kidney failure Brother   . Thyroid disease Sister    Social History   Occupational History  . Not on file.   Social History Main Topics  . Smoking status: Never Smoker  . Smokeless tobacco: Never Used  . Alcohol use No  . Drug use: No  . Sexual activity: No    Tobacco Counseling Counseling given: Not Answered   Activities of Daily Living In your present state of health, do you have any difficulty performing the following activities: 03/23/2016  Hearing? N  Vision? N  Difficulty concentrating or making decisions? N  Walking or climbing stairs? Y  Dressing or bathing? N  Doing errands, shopping? N  Preparing Food and eating ? N  Using the Toilet? N  In the past six months, have you accidently leaked urine? N  Do you have problems with loss of bowel control? N  Managing your Medications? N  Managing your Finances? N  Housekeeping or managing your Housekeeping? N  Some recent data might be hidden    Immunizations and Health Maintenance Immunization History  Administered Date(s) Administered  . Influenza Split 12/24/2010  . Influenza,inj,Quad PF,36+ Mos 11/29/2012, 10/04/2013, 11/16/2014, 11/21/2015  . Td 07/04/2010   There are no preventive care reminders to display for this patient.  Patient Care Team: Hali Marry, MD as PCP - General (Family Medicine)  Indicate any recent Medical Services you may have received from other than [facility] providers in the past year (date may be approximate).     Assessment:   This is a routine wellness examination for [place].   Hearing/Vision screen No exam data present  Dietary issues and exercise activities discussed: Current Exercise Habits: The patient does not participate in regular exercise at present, Exercise limited by: cardiac condition(s);orthopedic  condition(s);Other - see comments (Pt. reports that she feels off balance at times.)  Goals    . Exercise 3x per week (30 min per time) (pt-stated)          She would like to participate in a structured exercise program such as Silver Sneakers or water aerobics a few times per week at the [facility].      Depression Screen PHQ 2/9 Scores 03/23/2016  PHQ - 2 Score 0    Fall Risk Fall Risk  03/23/2016  Falls in the past year? No  Risk for fall due to : Impaired balance/gait;Impaired mobility    Cognitive Function:     6CIT Screen 03/23/2016  What Year? 0 points  What month? 0 points  What time? 0 points  Months in reverse 0 points  Repeat phrase 0 points    Screening Tests Health Maintenance  Topic Date Due  . COLONOSCOPY  03/23/2017 (Originally 08/17/2013)  . ZOSTAVAX  03/23/2017 (Originally 07/06/2015)  . Hepatitis C Screening  03/23/2017 (Originally [birth year])  . HIV Screening  03/23/2017 (Originally 07/06/1970)  . MAMMOGRAM  11/28/2017  . TETANUS/TDAP  07/03/2020  . INFLUENZA VACCINE  Completed      Plan:  During the course of the visit, [place] was educated and counseled about the following appropriate screening and preventive services:   Vaccines to include Td, Zostavax-Declined shingles vaccine.  Cardiovascular disease screening  Colorectal cancer screening-Pt. Reported  this being done in 2017 and we will attempt to have [facility] to send a report.  Bone density screening  Mammography-Due 11-2016.  Nutrition counseling    Patient Instructions (the written plan) were given to the patient.    METHENEY,CATHERINE, MD   03/23/2016

## 2016-03-24 ENCOUNTER — Other Ambulatory Visit: Payer: Self-pay

## 2016-03-24 MED ORDER — BLOOD PRESSURE CUFF MISC: 0 refills | Status: DC

## 2016-03-25 DIAGNOSIS — K219 Gastro-esophageal reflux disease without esophagitis: Secondary | ICD-10-CM | POA: Diagnosis not present

## 2016-03-25 DIAGNOSIS — R1013 Epigastric pain: Secondary | ICD-10-CM | POA: Diagnosis not present

## 2016-03-25 DIAGNOSIS — R079 Chest pain, unspecified: Secondary | ICD-10-CM | POA: Diagnosis not present

## 2016-03-25 DIAGNOSIS — G4733 Obstructive sleep apnea (adult) (pediatric): Secondary | ICD-10-CM | POA: Diagnosis not present

## 2016-03-25 DIAGNOSIS — R1310 Dysphagia, unspecified: Secondary | ICD-10-CM | POA: Diagnosis not present

## 2016-03-26 DIAGNOSIS — R079 Chest pain, unspecified: Secondary | ICD-10-CM | POA: Diagnosis not present

## 2016-03-26 DIAGNOSIS — R110 Nausea: Secondary | ICD-10-CM | POA: Diagnosis not present

## 2016-03-26 DIAGNOSIS — Z86010 Personal history of colonic polyps: Secondary | ICD-10-CM | POA: Diagnosis not present

## 2016-03-26 DIAGNOSIS — R1013 Epigastric pain: Secondary | ICD-10-CM | POA: Diagnosis not present

## 2016-03-26 DIAGNOSIS — D649 Anemia, unspecified: Secondary | ICD-10-CM | POA: Diagnosis not present

## 2016-03-26 DIAGNOSIS — K219 Gastro-esophageal reflux disease without esophagitis: Secondary | ICD-10-CM | POA: Diagnosis not present

## 2016-03-26 DIAGNOSIS — R1310 Dysphagia, unspecified: Secondary | ICD-10-CM | POA: Diagnosis not present

## 2016-03-31 DIAGNOSIS — R0989 Other specified symptoms and signs involving the circulatory and respiratory systems: Secondary | ICD-10-CM | POA: Diagnosis not present

## 2016-03-31 DIAGNOSIS — I2510 Atherosclerotic heart disease of native coronary artery without angina pectoris: Secondary | ICD-10-CM | POA: Diagnosis not present

## 2016-03-31 DIAGNOSIS — E7800 Pure hypercholesterolemia, unspecified: Secondary | ICD-10-CM | POA: Diagnosis not present

## 2016-03-31 DIAGNOSIS — K219 Gastro-esophageal reflux disease without esophagitis: Secondary | ICD-10-CM | POA: Insufficient documentation

## 2016-03-31 DIAGNOSIS — I10 Essential (primary) hypertension: Secondary | ICD-10-CM | POA: Diagnosis not present

## 2016-04-01 DIAGNOSIS — R0989 Other specified symptoms and signs involving the circulatory and respiratory systems: Secondary | ICD-10-CM | POA: Diagnosis not present

## 2016-04-08 DIAGNOSIS — K2970 Gastritis, unspecified, without bleeding: Secondary | ICD-10-CM | POA: Diagnosis not present

## 2016-04-08 DIAGNOSIS — R1310 Dysphagia, unspecified: Secondary | ICD-10-CM | POA: Diagnosis not present

## 2016-04-08 DIAGNOSIS — K219 Gastro-esophageal reflux disease without esophagitis: Secondary | ICD-10-CM | POA: Diagnosis not present

## 2016-04-08 DIAGNOSIS — K2950 Unspecified chronic gastritis without bleeding: Secondary | ICD-10-CM | POA: Diagnosis not present

## 2016-04-08 DIAGNOSIS — R079 Chest pain, unspecified: Secondary | ICD-10-CM | POA: Diagnosis not present

## 2016-04-12 ENCOUNTER — Encounter: Payer: Self-pay | Admitting: Family Medicine

## 2016-04-13 DIAGNOSIS — K8020 Calculus of gallbladder without cholecystitis without obstruction: Secondary | ICD-10-CM | POA: Diagnosis not present

## 2016-05-05 DIAGNOSIS — R6889 Other general symptoms and signs: Secondary | ICD-10-CM | POA: Diagnosis not present

## 2016-05-05 DIAGNOSIS — K8020 Calculus of gallbladder without cholecystitis without obstruction: Secondary | ICD-10-CM | POA: Diagnosis not present

## 2016-05-05 DIAGNOSIS — I2510 Atherosclerotic heart disease of native coronary artery without angina pectoris: Secondary | ICD-10-CM | POA: Diagnosis not present

## 2016-05-05 DIAGNOSIS — I10 Essential (primary) hypertension: Secondary | ICD-10-CM | POA: Diagnosis not present

## 2016-05-06 DIAGNOSIS — K8020 Calculus of gallbladder without cholecystitis without obstruction: Secondary | ICD-10-CM | POA: Insufficient documentation

## 2016-05-12 DIAGNOSIS — R6889 Other general symptoms and signs: Secondary | ICD-10-CM | POA: Diagnosis not present

## 2016-05-14 DIAGNOSIS — K8010 Calculus of gallbladder with chronic cholecystitis without obstruction: Secondary | ICD-10-CM | POA: Diagnosis not present

## 2016-05-14 DIAGNOSIS — E7800 Pure hypercholesterolemia, unspecified: Secondary | ICD-10-CM | POA: Diagnosis not present

## 2016-05-14 DIAGNOSIS — G4733 Obstructive sleep apnea (adult) (pediatric): Secondary | ICD-10-CM | POA: Diagnosis not present

## 2016-05-14 DIAGNOSIS — K8020 Calculus of gallbladder without cholecystitis without obstruction: Secondary | ICD-10-CM | POA: Diagnosis not present

## 2016-05-14 DIAGNOSIS — I10 Essential (primary) hypertension: Secondary | ICD-10-CM | POA: Diagnosis not present

## 2016-05-14 DIAGNOSIS — R6889 Other general symptoms and signs: Secondary | ICD-10-CM | POA: Diagnosis not present

## 2016-05-14 DIAGNOSIS — Z881 Allergy status to other antibiotic agents status: Secondary | ICD-10-CM | POA: Diagnosis not present

## 2016-05-14 DIAGNOSIS — Z882 Allergy status to sulfonamides status: Secondary | ICD-10-CM | POA: Diagnosis not present

## 2016-05-14 DIAGNOSIS — M1990 Unspecified osteoarthritis, unspecified site: Secondary | ICD-10-CM | POA: Diagnosis not present

## 2016-05-14 DIAGNOSIS — Z9989 Dependence on other enabling machines and devices: Secondary | ICD-10-CM | POA: Diagnosis not present

## 2016-05-14 DIAGNOSIS — M797 Fibromyalgia: Secondary | ICD-10-CM | POA: Diagnosis not present

## 2016-05-14 DIAGNOSIS — K219 Gastro-esophageal reflux disease without esophagitis: Secondary | ICD-10-CM | POA: Diagnosis not present

## 2016-05-14 HISTORY — PX: CHOLECYSTECTOMY: SHX55

## 2016-05-21 ENCOUNTER — Ambulatory Visit: Payer: Commercial Managed Care - HMO | Admitting: Family Medicine

## 2016-06-02 DIAGNOSIS — R6889 Other general symptoms and signs: Secondary | ICD-10-CM | POA: Diagnosis not present

## 2016-06-10 DIAGNOSIS — E0590 Thyrotoxicosis, unspecified without thyrotoxic crisis or storm: Secondary | ICD-10-CM | POA: Diagnosis not present

## 2016-06-10 DIAGNOSIS — E559 Vitamin D deficiency, unspecified: Secondary | ICD-10-CM | POA: Diagnosis not present

## 2016-06-10 DIAGNOSIS — I2510 Atherosclerotic heart disease of native coronary artery without angina pectoris: Secondary | ICD-10-CM | POA: Diagnosis not present

## 2016-06-10 DIAGNOSIS — I10 Essential (primary) hypertension: Secondary | ICD-10-CM | POA: Diagnosis not present

## 2016-06-11 ENCOUNTER — Other Ambulatory Visit: Payer: Self-pay | Admitting: Family Medicine

## 2016-06-17 DIAGNOSIS — R6889 Other general symptoms and signs: Secondary | ICD-10-CM | POA: Diagnosis not present

## 2016-06-19 ENCOUNTER — Ambulatory Visit (INDEPENDENT_AMBULATORY_CARE_PROVIDER_SITE_OTHER): Payer: Medicare HMO | Admitting: Physician Assistant

## 2016-06-19 ENCOUNTER — Ambulatory Visit (INDEPENDENT_AMBULATORY_CARE_PROVIDER_SITE_OTHER): Payer: Medicare HMO

## 2016-06-19 VITALS — BP 106/74 | HR 66 | Temp 98.2°F | Wt 251.0 lb

## 2016-06-19 DIAGNOSIS — R05 Cough: Secondary | ICD-10-CM | POA: Diagnosis not present

## 2016-06-19 DIAGNOSIS — J209 Acute bronchitis, unspecified: Secondary | ICD-10-CM

## 2016-06-19 DIAGNOSIS — R079 Chest pain, unspecified: Secondary | ICD-10-CM | POA: Diagnosis not present

## 2016-06-19 DIAGNOSIS — R0789 Other chest pain: Secondary | ICD-10-CM

## 2016-06-19 MED ORDER — IPRATROPIUM-ALBUTEROL 0.5-2.5 (3) MG/3ML IN SOLN
3.0000 mL | Freq: Once | RESPIRATORY_TRACT | Status: AC
  Administered 2016-06-19: 3 mL via RESPIRATORY_TRACT

## 2016-06-19 NOTE — Patient Instructions (Addendum)
- Go downstairs for chest x-ray - Your EKG looked good today - Continue your allegra and fluticasone daily - Tylenol 500mg  every 6 hours as needed for pain - Delsym or Robitussin DM for cough - Cool mist humidifier - If you have worsening chest pains, shortness of breath or palpitations, go to the nearest emergency room  Acute Bronchitis, Adult Acute bronchitis is sudden (acute) swelling of the air tubes (bronchi) in the lungs. Acute bronchitis causes these tubes to fill with mucus, which can make it hard to breathe. It can also cause coughing or wheezing. In adults, acute bronchitis usually goes away within 2 weeks. A cough caused by bronchitis may last up to 3 weeks. Smoking, allergies, and asthma can make the condition worse. Repeated episodes of bronchitis may cause further lung problems, such as chronic obstructive pulmonary disease (COPD). What are the causes? This condition can be caused by germs and by substances that irritate the lungs, including:  Cold and flu viruses. This condition is most often caused by the same virus that causes a cold.  Bacteria.  Exposure to tobacco smoke, dust, fumes, and air pollution. What increases the risk? This condition is more likely to develop in people who:  Have close contact with someone with acute bronchitis.  Are exposed to lung irritants, such as tobacco smoke, dust, fumes, and vapors.  Have a weak immune system.  Have a respiratory condition such as asthma. What are the signs or symptoms? Symptoms of this condition include:  A cough.  Coughing up clear, yellow, or green mucus.  Wheezing.  Chest congestion.  Shortness of breath.  A fever.  Body aches.  Chills.  A sore throat. How is this diagnosed? This condition is usually diagnosed with a physical exam. During the exam, your health care provider may order tests, such as chest X-rays, to rule out other conditions. He or she may also:  Test a sample of your mucus for  bacterial infection.  Check the level of oxygen in your blood. This is done to check for pneumonia.  Do a chest X-ray or lung function testing to rule out pneumonia and other conditions.  Perform blood tests. Your health care provider will also ask about your symptoms and medical history. How is this treated? Most cases of acute bronchitis clear up over time without treatment. Your health care provider may recommend:  Drinking more fluids. Drinking more makes your mucus thinner, which may make it easier to breathe.  Taking a medicine for a fever or cough.  Using a cool mist vaporizer or humidifier to make it easier to breathe. General instructions   Get plenty of rest.  Drink enough fluids to keep your urine clear or pale yellow.  Avoid smoking and secondhand smoke. Exposure to cigarette smoke or irritating chemicals will make bronchitis worse. If you smoke and you need help quitting, ask your health care provider. Quitting smoking will help your lungs heal faster.  Use an inhaler, cool mist vaporizer, or humidifier as told by your health care provider.  Keep all follow-up visits as told by your health care provider. This is important. How is this prevented? To lower your risk of getting this condition again:  Wash your hands often with soap and water. If soap and water are not available, use hand sanitizer.  Avoid contact with people who have cold symptoms.  Try not to touch your hands to your mouth, nose, or eyes.  Make sure to get the flu shot every year.  Contact a health care provider if:  Your symptoms do not improve in 2 weeks of treatment. Get help right away if:  You cough up blood.  You have chest pain.  You have severe shortness of breath.  You become dehydrated.  You faint or keep feeling like you are going to faint.  You keep vomiting.  You have a severe headache.  Your fever or chills gets worse. This information is not intended to replace advice  given to you by your health care provider. Make sure you discuss any questions you have with your health care provider. Document Released: 03/12/2004 Document Revised: 08/28/2015 Document Reviewed: 07/24/2015 Elsevier Interactive Patient Education  2017 [employer].

## 2016-06-19 NOTE — Progress Notes (Signed)
HPI:                                                                Sharon Reilly is a 61 y.o. female who presents to [facility] today for sinus congestion  Patient with PMH of HTN, CAD, NSTEMI (02/2016), SLE presents with  Cough  This is a new problem. The current episode started in the past 7 days. The problem has been gradually improving. The problem occurs every few minutes. The cough is productive of sputum (clear). Associated symptoms include chest pain, ear congestion, headaches and rhinorrhea. Pertinent negatives include no fever or shortness of breath. Treatments tried: Allegra, Fluticasone. Her past medical history is significant for bronchitis. There is no history of asthma or COPD.  Patient lives in a retirement residential facility with recent sick contacts.  Patient reports chest pain with coughing, deep inspiration, but also with exertion. She also reports posterior back/rib pain.  Past Medical History:  Diagnosis Date  . Fibromyalgia   . Hyperlipidemia   . Hypertension   . Lupus   . Lupus (systemic lupus erythematosus) ([place])   . NSTEMI (non-ST elevated myocardial infarction) ([place]) 03/08/2016   [facility]  . Obesity   . Osteoarthritis   . Osteoporosis    Past Surgical History:  Procedure Laterality Date  . ABDOMINAL HYSTERECTOMY     partial  . BREAST SURGERY     reduction  . CARPAL TUNNEL RELEASE     left  . epicondylitis     left  . fistulotomy    . great toe fusion     left  . MRI syr ortho spec     Dr. Christen Butter  . right elbow    . surgisis biodesign fistula plug    . talonavicular joint fusion     LT foot   Social History  Substance Use Topics  . Smoking status: Never Smoker  . Smokeless tobacco: Never Used  . Alcohol use No   family history includes Breast cancer in her mother; Diabetes in her brother; Hyperlipidemia in her father and mother; Hypertension in her mother; Kidney failure in her brother;  Thyroid disease in her sister.  ROS: negative except as noted in the HPI  Medications: Current Outpatient Prescriptions  Medication Sig Dispense Refill  . acetaminophen (TYLENOL) 325 MG tablet Take 650 mg by mouth.    . AMBULATORY NON FORMULARY MEDICATION Medication Name: Call Huey Romans to get a download off her CPAP and fax to our office.  [phone] 1 Units 0  . AMBULATORY NON FORMULARY MEDICATION Medication Name: roll about walker. Dx. M54.42  Lumbago with sciatica 1 each 0  . AMBULATORY NON FORMULARY MEDICATION Medication Name: blood pressure cuff Dx:I10 1 each 0  . aspirin 81 MG tablet Take 81 mg by mouth daily.      . fexofenadine (ALLEGRA) 180 MG tablet Take 1 tablet (180 mg total) by mouth daily. 30 tablet 6  . fluticasone (VERAMYST) 27.5 MCG/SPRAY nasal spray Place 2 sprays into the nose 3 (three) times daily as needed for rhinitis.    [name] losartan-hydrochlorothiazide (HYZAAR) 100-25 MG tablet take 1 tablet by mouth once daily 90 tablet 1  . methimazole (TAPAZOLE) 5 MG tablet Take 2.5 mg by mouth  daily.   0  . metoprolol (LOPRESSOR) 100 MG tablet take 1 tablet by mouth twice a day 180 tablet 2  . omeprazole (PRILOSEC) 40 MG capsule Take 1 capsule (40 mg total) by mouth 2 (two) times daily. 180 capsule 1  . rosuvastatin (CRESTOR) 10 MG tablet Take 10 mg by mouth daily.    . simvastatin (ZOCOR) 20 MG tablet Take 1 tablet (20 mg total) by mouth at bedtime. *ADDITIONAL REFILLS WILL REQUIRE LABS. PLEASE CONTACT OFFICE TO GET THESE DONE* 90 tablet 0  . Vitamin D, Ergocalciferol, (DRISDOL) 50000 units CAPS capsule      No current facility-administered medications for this visit.    Allergies  Allergen Reactions  . Amoxicillin Hives and Itching    No rash.   . Erythromycin Hives  . Sulfonamide Derivatives   . Doxycycline Hives  . Sulfa Antibiotics Hives and Rash    Objective:  BP 106/74   Pulse 66   Temp 98.2 F (36.8 C) (Oral)   Wt 251 lb (113.9 kg)   SpO2 98%   BMI 45.91  kg/m  Gen: well-groomed, cooperative, not ill-appearing, no distress HEENT: normal conjunctiva, TM's clear, nasal mucosa edematous, oropharynx clear, moist mucus membranes, no frontal or maxillary sinus tenderness, neck supple, trachea midline Pulm: Normal work of breathing, normal phonation, breath sounds are diminished, no wheezes, rales or rhonchi CV: Normal rate, regular rhythm, s1 and s2 distinct, no murmurs, clicks or rubs  Neuro: alert and oriented x 3, EOM's intact, no tremor MSK: moving all extremities, normal gait and station, no peripheral edema Lymph: no cervical adenopathy, she exhibits tenderness in the left tonsillar distribution Skin: warm, dry, intact; no rashes or lesions on exposed skin, no cyanosis   No results found for this or any previous visit (from the past 72 hour(s)). No results found.  ECG 06/19/2016 Vent Rate 64 PR-I 200 QRS 88 QTc 406 Normal sinus rhythm Nonspecific T wave abnormality  Assessment and Plan: 61 y.o. female with  1. Other chest pain - EKG 12-Lead, NSR w/normal PR-I, normal QTc, no ST elevations  2. Acute bronchitis, unspecified organism - Duoneb given in clinic - DG Chest 2 View to r/o infiltrate - discussed symptomatic management  - patient did request antibiotic. Explained normal course of illness and that it is caused by a virus. Patient has multiple antibiotic allergies and antibiotics should be reserved when there is a known bacterial infection. Patient afebrile, SpO2 98% on RA  Patient education and anticipatory guidance given Patient agrees with treatment plan Follow-up as needed if symptoms worsen or fail to improve  Darlyne Russian PA-C

## 2016-08-22 ENCOUNTER — Other Ambulatory Visit: Payer: Self-pay | Admitting: Family Medicine

## 2016-09-04 ENCOUNTER — Other Ambulatory Visit: Payer: Self-pay | Admitting: Family Medicine

## 2016-09-21 ENCOUNTER — Ambulatory Visit (INDEPENDENT_AMBULATORY_CARE_PROVIDER_SITE_OTHER): Payer: Medicare HMO

## 2016-09-21 ENCOUNTER — Encounter: Payer: Self-pay | Admitting: Family Medicine

## 2016-09-21 ENCOUNTER — Ambulatory Visit (INDEPENDENT_AMBULATORY_CARE_PROVIDER_SITE_OTHER): Payer: Medicare HMO | Admitting: Family Medicine

## 2016-09-21 VITALS — BP 131/58 | HR 63 | Wt 251.0 lb

## 2016-09-21 DIAGNOSIS — M79674 Pain in right toe(s): Secondary | ICD-10-CM

## 2016-09-21 DIAGNOSIS — R7301 Impaired fasting glucose: Secondary | ICD-10-CM

## 2016-09-21 DIAGNOSIS — M170 Bilateral primary osteoarthritis of knee: Secondary | ICD-10-CM | POA: Diagnosis not present

## 2016-09-21 DIAGNOSIS — R7989 Other specified abnormal findings of blood chemistry: Secondary | ICD-10-CM

## 2016-09-21 DIAGNOSIS — M25361 Other instability, right knee: Secondary | ICD-10-CM | POA: Diagnosis not present

## 2016-09-21 DIAGNOSIS — M25362 Other instability, left knee: Secondary | ICD-10-CM

## 2016-09-21 DIAGNOSIS — M7989 Other specified soft tissue disorders: Secondary | ICD-10-CM | POA: Diagnosis not present

## 2016-09-21 DIAGNOSIS — M1712 Unilateral primary osteoarthritis, left knee: Secondary | ICD-10-CM | POA: Diagnosis not present

## 2016-09-21 DIAGNOSIS — I10 Essential (primary) hypertension: Secondary | ICD-10-CM

## 2016-09-21 DIAGNOSIS — M19071 Primary osteoarthritis, right ankle and foot: Secondary | ICD-10-CM

## 2016-09-21 DIAGNOSIS — M25561 Pain in right knee: Secondary | ICD-10-CM | POA: Diagnosis not present

## 2016-09-21 MED ORDER — DICLOFENAC SODIUM 1 % TD GEL: 2.0000 g | Freq: Four times a day (QID) | TRANSDERMAL | 99 refills | Status: DC

## 2016-09-21 NOTE — Progress Notes (Signed)
Subjective:    Patient ID: Sharon Reilly, female    DOB: Dec 04, 1955, 61 y.o.   MRN: [id number]  HPI   61 year old female here today for follow-up. She is status post cholecystectomy since I last saw her. She said she's actually been doing well and has recovered well from her surgery.  Hypertension- Pt denies chest pain, SOB, dizziness, or heart palpitations.  Taking meds as directed w/o problems.  Denies medication side effects.    Right great toe pain x > 1 months. She says sometimes it gets a little swollen Dr. medicine so but enough that it's painful to put her tissue on. She denies any erythema or increased warmth. She says it sore to walk on. She's worried that it could be fractured even though she doesn't remember an injury she had actually walked on her left foot a couple years ago for several months before realizing that that great toe had been fractured on the left side.  Also c/ of right heel pain > 1 mo. No trauma or injury. She says it can hurt at rest or with walking on it. It does not radiate into the arch is over the anterior part of the heel. Again painful to stand on it for a very long time. She denies any known trauma or injury. She ascribes the pain is sharp.  She also complains of bilateral knee pain. She says sometimes emesis feels like her knees or hyperextending or even locking at times. No giving out or falls. She was previous on taking Aleve every day and she thinks that could've been keeping her pain under control the cardiologist asked her to stop NSAIDs and to just use Tylenol. Since then she's had more pain.   Review of Systems  BP (!) 131/58   Pulse 63   Wt 251 lb (113.9 kg)   BMI 45.91 kg/m     Allergies  Allergen Reactions  . Amoxicillin Hives and Itching    No rash.   . Erythromycin Hives  . Sulfonamide Derivatives   . Doxycycline Hives  . Sulfa Antibiotics Hives and Rash    Past Medical History:  Diagnosis Date  . Fibromyalgia   .  Hyperlipidemia   . Hypertension   . Lupus   . Lupus (systemic lupus erythematosus) ([place])   . NSTEMI (non-ST elevated myocardial infarction) ([place]) 03/08/2016   [facility]  . Obesity   . Osteoarthritis   . Osteoporosis     Past Surgical History:  Procedure Laterality Date  . ABDOMINAL HYSTERECTOMY     partial  . BREAST SURGERY     reduction  . CARPAL TUNNEL RELEASE     left  . epicondylitis     left  . fistulotomy    . great toe fusion     left  . MRI syr ortho spec     Dr. Christen Butter  . right elbow    . surgisis biodesign fistula plug    . talonavicular joint fusion     LT foot    Social History   Social History  . Marital status: Single    Spouse name: N/A  . Number of children: N/A  . Years of education: N/A   Occupational History  . Not on file.   Social History Main Topics  . Smoking status: Never Smoker  . Smokeless tobacco: Never Used  . Alcohol use No  . Drug use: No  . Sexual activity: No   Other Topics Concern  . Not  on file   Social History Narrative  . No narrative on file    Family History  Problem Relation Age of Onset  . Breast cancer Mother        Diagnosed in her 63's.  [name] Hyperlipidemia Mother   . Hypertension Mother   . Hyperlipidemia Father   . Diabetes Brother   . Diabetes Unknown        aunt  . Kidney failure Brother   . Thyroid disease Sister     Outpatient Encounter Prescriptions as of 09/21/2016  Medication Sig  . acetaminophen (TYLENOL) 325 MG tablet Take 650 mg by mouth.  . AMBULATORY NON FORMULARY MEDICATION Medication Name: Call Huey Romans to get a download off her CPAP and fax to our office.  [phone]  . AMBULATORY NON FORMULARY MEDICATION Medication Name: roll about walker. Dx. M54.42  Lumbago with sciatica  . aspirin 81 MG tablet Take 81 mg by mouth daily.    . fexofenadine (ALLEGRA) 180 MG tablet Take 1 tablet (180 mg total) by mouth daily.  . fluticasone (VERAMYST) 27.5 MCG/SPRAY nasal spray Place 2 sprays into the nose  3 (three) times daily as needed for rhinitis.  [name] losartan-hydrochlorothiazide (HYZAAR) 100-25 MG tablet take 1 tablet by mouth once daily  . methimazole (TAPAZOLE) 5 MG tablet Take 2.5 mg by mouth daily.   . metoprolol tartrate (LOPRESSOR) 100 MG tablet TAKE 1 TABLET BY MOUTH TWICE A DAY  . omeprazole (PRILOSEC) 40 MG capsule TAKE 1 CAPSULE BY MOUTH TWICE A DAY  . rosuvastatin (CRESTOR) 10 MG tablet Take 10 mg by mouth daily.  . simvastatin (ZOCOR) 20 MG tablet Take 1 tablet (20 mg total) by mouth at bedtime. *ADDITIONAL REFILLS WILL REQUIRE LABS. PLEASE CONTACT OFFICE TO GET THESE DONE*  . Vitamin D, Ergocalciferol, (DRISDOL) 50000 units CAPS capsule   . [DISCONTINUED] AMBULATORY NON FORMULARY MEDICATION Medication Name: blood pressure cuff Dx:I10   No facility-administered encounter medications on file as of 09/21/2016.          Objective:   Physical Exam  Constitutional: She is oriented to person, place, and time. She appears well-developed and well-nourished.  HENT:  Head: Normocephalic and atraumatic.  Cardiovascular: Normal rate, regular rhythm and normal heart sounds.   Pulmonary/Chest: Effort normal and breath sounds normal.  Musculoskeletal:  No swelling over the right foot or ankle. She is tender at the base of the heel. Nontender into the arch. Some discomfort with dorsiflexion. She is also tender and increased warmth over the MTP at the great toe. Normal flexion and extension. No significant swelling or erythema on exam today.  Neurological: She is alert and oriented to person, place, and time.  Skin: Skin is warm and dry.  Psychiatric: She has a normal mood and affect. Her behavior is normal.        Assessment & Plan:  HTN - Well controlled. Continue current regimen. Follow up in  6 months.   Plantar fascititis right foot - based on description of pain and exam today I do think she has planter fasciitis of the right foot. Discussed diagnosis with her recommend  stretching and icing and anti-inflammatory as needed for a short period of time. Cardiology does not want her on long-term NSAIDs. If not improving then recommend further evaluation by one of our sports medicine providers. I do think she would benefit from orthotics. She said she had them years ago and they were helpful.  Left great toe pain - suspect osteoarthritis. I don't think  she's had enough swelling to be consistent with gout. Did recommend x-rays today just to rule out fracture since her pain has been persistent for several months. X-ray was negative for fracture but did confirm moderate to severe osteoporosis. Again I think she would benefit from custom orthotics and further treatment by sports medicine.  Bilat knee pain - suspect osteoarthritis. Recommend x-rays for further evaluation at this point in time. Continue with Tylenol since she has been recommended to avoid NSAIDs. We could consider a topical NSAID in the future though and she might benefit from physical therapy.

## 2016-09-21 NOTE — Patient Instructions (Signed)
We referred you to [facility] last year for abnormal labs.

## 2016-09-24 ENCOUNTER — Encounter: Payer: Medicare HMO | Admitting: Family Medicine

## 2016-09-25 ENCOUNTER — Telehealth: Payer: Self-pay

## 2016-09-25 DIAGNOSIS — M329 Systemic lupus erythematosus, unspecified: Secondary | ICD-10-CM

## 2016-09-25 NOTE — Telephone Encounter (Signed)
patient requested a referral for Rheumatology. Referral has been placed and referral coordinator has been informed. Rhonda Cunningham,CMA

## 2016-09-25 NOTE — Telephone Encounter (Signed)
Patient called requested a referral for Dr. Barkley Boards of [facility] for Rheumatology. Ginia Rudell,CMA

## 2016-09-25 NOTE — Telephone Encounter (Signed)
OK to place referral. Dx SLE

## 2016-09-29 ENCOUNTER — Ambulatory Visit: Payer: Medicare HMO | Admitting: Family Medicine

## 2016-09-29 ENCOUNTER — Ambulatory Visit (INDEPENDENT_AMBULATORY_CARE_PROVIDER_SITE_OTHER): Payer: Medicare HMO | Admitting: Family Medicine

## 2016-09-29 ENCOUNTER — Encounter: Payer: Self-pay | Admitting: Family Medicine

## 2016-09-29 DIAGNOSIS — G8929 Other chronic pain: Secondary | ICD-10-CM | POA: Diagnosis not present

## 2016-09-29 DIAGNOSIS — I10 Essential (primary) hypertension: Secondary | ICD-10-CM | POA: Diagnosis not present

## 2016-09-29 DIAGNOSIS — M25561 Pain in right knee: Secondary | ICD-10-CM | POA: Diagnosis not present

## 2016-09-29 DIAGNOSIS — R7301 Impaired fasting glucose: Secondary | ICD-10-CM | POA: Diagnosis not present

## 2016-09-29 DIAGNOSIS — M25562 Pain in left knee: Secondary | ICD-10-CM

## 2016-09-29 DIAGNOSIS — M722 Plantar fascial fibromatosis: Secondary | ICD-10-CM | POA: Insufficient documentation

## 2016-09-29 DIAGNOSIS — M170 Bilateral primary osteoarthritis of knee: Secondary | ICD-10-CM | POA: Insufficient documentation

## 2016-09-29 DIAGNOSIS — M2021 Hallux rigidus, right foot: Secondary | ICD-10-CM

## 2016-09-29 NOTE — Progress Notes (Signed)
Sharon Reilly is a 61 y.o. female who presents to [facility] today for evaluation for knee pain and foot pain.  Bilateral knee pain: Patient has bilateral knee pain ongoing for years. She denies any injury. She notes the pain is diffuse and worse with activity and better with rest. She denies radiating pain. She does note some locking and catching. She feels well otherwise however with no fevers or chills. She has not really tried any treatment yet.  Foot pain: Patient has bilateral foot pain over her right is much worse than her left. She notes some pain at the plantar calcaneus as well as the first MTP. She has minimal pain at the left first MTP as well. The pain is worse with activity and better with rest. She denies any radiating pain. She denies any injury. She has not tried any significant treatment yet.   Past Medical History:  Diagnosis Date  . Fibromyalgia   . Hyperlipidemia   . Hypertension   . Lupus   . Lupus (systemic lupus erythematosus) ([place])   . NSTEMI (non-ST elevated myocardial infarction) ([place]) 03/08/2016   [facility]  . Obesity   . Osteoarthritis   . Osteoporosis    Past Surgical History:  Procedure Laterality Date  . ABDOMINAL HYSTERECTOMY     partial  . BREAST SURGERY     reduction  . CARPAL TUNNEL RELEASE     left  . epicondylitis     left  . fistulotomy    . great toe fusion     left  . MRI syr ortho spec     Dr. Christen Butter  . right elbow    . surgisis biodesign fistula plug    . talonavicular joint fusion     LT foot   Social History  Substance Use Topics  . Smoking status: Never Smoker  . Smokeless tobacco: Never Used  . Alcohol use No     ROS:  As above   Medications: Current Outpatient Prescriptions  Medication Sig Dispense Refill  . acetaminophen (TYLENOL) 325 MG tablet Take 650 mg by mouth.    . AMBULATORY NON FORMULARY MEDICATION Medication Name: Call Huey Romans to get a download off her CPAP  and fax to our office.  [phone] 1 Units 0  . AMBULATORY NON FORMULARY MEDICATION Medication Name: roll about walker. Dx. M54.42  Lumbago with sciatica 1 each 0  . aspirin 81 MG tablet Take 81 mg by mouth daily.      . diclofenac sodium (VOLTAREN) 1 % GEL Apply 2 g topically 4 (four) times daily. To her knees. 400 g PRN  . fexofenadine (ALLEGRA) 180 MG tablet Take 1 tablet (180 mg total) by mouth daily. 30 tablet 6  . fluticasone (VERAMYST) 27.5 MCG/SPRAY nasal spray Place 2 sprays into the nose 3 (three) times daily as needed for rhinitis.    [name] losartan-hydrochlorothiazide (HYZAAR) 100-25 MG tablet take 1 tablet by mouth once daily 90 tablet 1  . methimazole (TAPAZOLE) 5 MG tablet Take 2.5 mg by mouth daily.   0  . metoprolol tartrate (LOPRESSOR) 100 MG tablet TAKE 1 TABLET BY MOUTH TWICE A DAY 180 tablet 1  . omeprazole (PRILOSEC) 40 MG capsule TAKE 1 CAPSULE BY MOUTH TWICE A DAY 180 capsule 0  . rosuvastatin (CRESTOR) 10 MG tablet Take 10 mg by mouth daily.    . simvastatin (ZOCOR) 20 MG tablet Take 1 tablet (20 mg total) by mouth at bedtime. *ADDITIONAL REFILLS WILL  REQUIRE LABS. PLEASE CONTACT OFFICE TO GET THESE DONE* 90 tablet 0  . Vitamin D, Ergocalciferol, (DRISDOL) 50000 units CAPS capsule      No current facility-administered medications for this visit.    Allergies  Allergen Reactions  . Amoxicillin Hives and Itching    No rash.   . Erythromycin Hives  . Sulfonamide Derivatives   . Doxycycline Hives  . Sulfa Antibiotics Hives and Rash     Exam:  BP 129/84   Pulse 61   Wt 251 lb (113.9 kg)   BMI 45.91 kg/m  General: Well Developed, well nourished, and in no acute distress.  Neuro/Psych: Alert and oriented x3, extra-ocular muscles intact, able to move all 4 extremities, sensation grossly intact. Skin: Warm and dry, no rashes noted.  Respiratory: Not using accessory muscles, speaking in full sentences, trachea midline.  Cardiovascular: Pulses palpable, no extremity  edema. Abdomen: Does not appear distended. MSK:  Knees bilaterally are unremarkable. With no effusion. Range of motion 5-120 with retropatellar crepitations   tender palpation medial joint line right worse than left. Positive McMurray's test medially or laterally. Intact flexion and extension strength.  Right foot has planus but otherwise normal-appearing Tender to palpation plantar calcaneus. Additionally tender to palpation first MTP with decreased motion Pulses capillary refill and sensation intact.  Left foot well-appearing mature scar on the dorsal foot and across the first interphalangeal joint. First toe Normal motion of MTP absent motion  IP joint   X-ray knee bilaterally and right foot August 2018 reviewed   No results found for this or any previous visit (from the past 49 hour(s)). No results found.    Assessment and Plan: 61 y.o. female with  Knee pain: Likely DJD related with potential meniscus injuries. We discussed options. Plan for topical diclofenac gel. Patient declined injections at this time. Additionally recommend weight loss of quad strengthening.  Foot pain: Likely plantar fasciitis and hallux rigidus. Patient will return in the near future for orthotics. Return sooner if needed.    No orders of the defined types were placed in this encounter.  No orders of the defined types were placed in this encounter.   Discussed warning signs or symptoms. Please see discharge instructions. Patient expresses understanding.  I spent 25 minutes with this patient, greater than 50% was face-to-face time counseling regarding unclear diagnosis of knee pain treatment options and prognosis. Additionally we discussed side effects of medications.[name]

## 2016-09-29 NOTE — Patient Instructions (Signed)
Thank you for coming in today. Use the gel on the knees and foot.  Schedule orthotics with me soon.  Bring you walking shoes.  We can consider shots for knee arthritis and toe arthritis as needed.

## 2016-09-30 LAB — COMPLETE METABOLIC PANEL WITH GFR
ALT: 9 U/L (ref 6–29)
AST: 16 U/L (ref 10–35)
Albumin: 3.6 g/dL (ref 3.6–5.1)
Alkaline Phosphatase: 76 U/L (ref 33–130)
BUN: 25 mg/dL (ref 7–25)
CALCIUM: 8.7 mg/dL (ref 8.6–10.4)
CHLORIDE: 108 mmol/L (ref 98–110)
CO2: 26 mmol/L (ref 20–32)
Creat: 1.32 mg/dL — ABNORMAL HIGH (ref 0.50–0.99)
GFR, EST AFRICAN AMERICAN: 50 mL/min — AB (ref >=60)
GFR, EST NON AFRICAN AMERICAN: 44 mL/min — AB (ref >=60)
Glucose, Bld: 91 mg/dL (ref 65–99)
POTASSIUM: 3.8 mmol/L (ref 3.5–5.3)
Sodium: 143 mmol/L (ref 135–146)
Total Bilirubin: 0.3 mg/dL (ref 0.2–1.2)
Total Protein: 6.5 g/dL (ref 6.1–8.1)

## 2016-09-30 LAB — LIPID PANEL W/REFLEX DIRECT LDL
CHOL/HDL RATIO: 3.6 ratio (ref <5.0)
CHOLESTEROL: 173 mg/dL (ref <200)
HDL: 48 mg/dL — ABNORMAL LOW (ref >50)
LDL-Cholesterol: 104 mg/dL — ABNORMAL HIGH
Non-HDL Cholesterol (Calc): 125 mg/dL (ref <130)
TRIGLYCERIDES: 118 mg/dL (ref <150)

## 2016-09-30 LAB — HEMOGLOBIN A1C
Hgb A1c MFr Bld: 5.9 % — ABNORMAL HIGH (ref <5.7)
MEAN PLASMA GLUCOSE: 123 mg/dL

## 2016-09-30 NOTE — Addendum Note (Signed)
Addended by: Teddy Spike on: 09/30/2016 03:52 PM   Modules accepted: Orders

## 2016-10-06 ENCOUNTER — Other Ambulatory Visit: Payer: Self-pay | Admitting: Family Medicine

## 2016-10-13 ENCOUNTER — Ambulatory Visit (INDEPENDENT_AMBULATORY_CARE_PROVIDER_SITE_OTHER): Payer: Medicare HMO | Admitting: Family Medicine

## 2016-10-13 VITALS — HR 70 | Temp 98.1°F | Ht 61.0 in | Wt 255.0 lb

## 2016-10-13 DIAGNOSIS — Z23 Encounter for immunization: Secondary | ICD-10-CM

## 2016-10-13 DIAGNOSIS — M2021 Hallux rigidus, right foot: Secondary | ICD-10-CM | POA: Diagnosis not present

## 2016-10-13 NOTE — Patient Instructions (Signed)
Thank you for coming in today.   Return as needed.    

## 2016-10-13 NOTE — Progress Notes (Signed)
    Orthotics Note:   Patient was fitted for a : standard, cushioned, semi-rigid orthotic. The orthotic was heated and afterward the patient stood on the orthotic blank positioned on the orthotic stand. The patient was positioned in subtalar neutral position and 10 degrees of ankle dorsiflexion in a weight bearing stance. After completion of molding, a stable base was applied to the orthotic blank. The blank was ground to a stable position for weight bearing. Size: 8 Base: [employer] and safety inspector and Padding: Right 1st ray post The patient ambulated these, and they were very comfortable.  I spent 40 minutes with this patient independent of orthotic preparation, greater than 50% was face-to-face time counseling regarding risks and benefits of orthotics expected side effects in addition to what type of shoes or compatible with semirigid orthotics as well as expected lifetime of orthotics.   Flu vaccine given as well.

## 2016-10-30 DIAGNOSIS — M150 Primary generalized (osteo)arthritis: Secondary | ICD-10-CM | POA: Diagnosis not present

## 2016-11-03 ENCOUNTER — Other Ambulatory Visit: Payer: Self-pay | Admitting: Family Medicine

## 2016-11-03 DIAGNOSIS — Z79899 Other long term (current) drug therapy: Secondary | ICD-10-CM | POA: Diagnosis not present

## 2016-11-03 DIAGNOSIS — Z1231 Encounter for screening mammogram for malignant neoplasm of breast: Secondary | ICD-10-CM

## 2016-11-03 DIAGNOSIS — M150 Primary generalized (osteo)arthritis: Secondary | ICD-10-CM | POA: Diagnosis not present

## 2016-11-18 DIAGNOSIS — N183 Chronic kidney disease, stage 3 (moderate): Secondary | ICD-10-CM | POA: Diagnosis not present

## 2016-11-18 DIAGNOSIS — M329 Systemic lupus erythematosus, unspecified: Secondary | ICD-10-CM | POA: Diagnosis not present

## 2016-11-18 DIAGNOSIS — R7301 Impaired fasting glucose: Secondary | ICD-10-CM | POA: Diagnosis not present

## 2016-11-18 DIAGNOSIS — D631 Anemia in chronic kidney disease: Secondary | ICD-10-CM | POA: Diagnosis not present

## 2016-11-18 DIAGNOSIS — I2510 Atherosclerotic heart disease of native coronary artery without angina pectoris: Secondary | ICD-10-CM | POA: Diagnosis not present

## 2016-11-18 DIAGNOSIS — E7800 Pure hypercholesterolemia, unspecified: Secondary | ICD-10-CM | POA: Diagnosis not present

## 2016-11-18 DIAGNOSIS — M1990 Unspecified osteoarthritis, unspecified site: Secondary | ICD-10-CM | POA: Diagnosis not present

## 2016-11-18 DIAGNOSIS — I10 Essential (primary) hypertension: Secondary | ICD-10-CM | POA: Diagnosis not present

## 2016-11-19 DIAGNOSIS — G4733 Obstructive sleep apnea (adult) (pediatric): Secondary | ICD-10-CM | POA: Diagnosis not present

## 2016-11-22 ENCOUNTER — Other Ambulatory Visit: Payer: Self-pay | Admitting: Family Medicine

## 2016-11-30 ENCOUNTER — Other Ambulatory Visit: Payer: Self-pay | Admitting: Family Medicine

## 2016-12-02 DIAGNOSIS — M150 Primary generalized (osteo)arthritis: Secondary | ICD-10-CM | POA: Diagnosis not present

## 2016-12-02 DIAGNOSIS — M797 Fibromyalgia: Secondary | ICD-10-CM | POA: Diagnosis not present

## 2016-12-09 ENCOUNTER — Ambulatory Visit (INDEPENDENT_AMBULATORY_CARE_PROVIDER_SITE_OTHER): Payer: Medicare HMO

## 2016-12-09 DIAGNOSIS — Z1231 Encounter for screening mammogram for malignant neoplasm of breast: Secondary | ICD-10-CM

## 2016-12-10 DIAGNOSIS — I10 Essential (primary) hypertension: Secondary | ICD-10-CM | POA: Diagnosis not present

## 2016-12-10 DIAGNOSIS — I2510 Atherosclerotic heart disease of native coronary artery without angina pectoris: Secondary | ICD-10-CM | POA: Diagnosis not present

## 2016-12-10 DIAGNOSIS — E0590 Thyrotoxicosis, unspecified without thyrotoxic crisis or storm: Secondary | ICD-10-CM | POA: Diagnosis not present

## 2016-12-10 DIAGNOSIS — E559 Vitamin D deficiency, unspecified: Secondary | ICD-10-CM | POA: Diagnosis not present

## 2017-01-11 DIAGNOSIS — R7301 Impaired fasting glucose: Secondary | ICD-10-CM | POA: Diagnosis not present

## 2017-01-11 DIAGNOSIS — N183 Chronic kidney disease, stage 3 (moderate): Secondary | ICD-10-CM | POA: Diagnosis not present

## 2017-01-11 DIAGNOSIS — I2510 Atherosclerotic heart disease of native coronary artery without angina pectoris: Secondary | ICD-10-CM | POA: Diagnosis not present

## 2017-01-11 DIAGNOSIS — N2581 Secondary hyperparathyroidism of renal origin: Secondary | ICD-10-CM | POA: Diagnosis not present

## 2017-01-11 DIAGNOSIS — I10 Essential (primary) hypertension: Secondary | ICD-10-CM | POA: Diagnosis not present

## 2017-01-11 DIAGNOSIS — M329 Systemic lupus erythematosus, unspecified: Secondary | ICD-10-CM | POA: Diagnosis not present

## 2017-01-11 DIAGNOSIS — E7800 Pure hypercholesterolemia, unspecified: Secondary | ICD-10-CM | POA: Diagnosis not present

## 2017-01-11 DIAGNOSIS — D631 Anemia in chronic kidney disease: Secondary | ICD-10-CM | POA: Diagnosis not present

## 2017-01-14 LAB — BASIC METABOLIC PANEL
BUN: 22 — AB (ref 4–21)
CREATININE: 1.2 — AB (ref 0.5–1.1)
Glucose: 109
POTASSIUM: 3.7 (ref 3.4–5.3)
Sodium: 140 (ref 137–147)

## 2017-01-14 LAB — CBC AND DIFFERENTIAL
HCT: 30 — AB (ref 36–46)
Hemoglobin: 10.2 — AB (ref 12.0–16.0)
Platelets: 280 (ref 150–399)
WBC: 4.4

## 2017-01-20 ENCOUNTER — Other Ambulatory Visit: Payer: Self-pay | Admitting: Family Medicine

## 2017-01-26 ENCOUNTER — Other Ambulatory Visit: Payer: Self-pay

## 2017-01-26 MED ORDER — OMEPRAZOLE 40 MG PO CPDR: 40.0000 mg | DELAYED_RELEASE_CAPSULE | Freq: Two times a day (BID) | ORAL | 0 refills | Status: DC

## 2017-01-29 ENCOUNTER — Telehealth: Payer: Self-pay

## 2017-01-29 ENCOUNTER — Encounter: Payer: Self-pay | Admitting: Family Medicine

## 2017-01-29 DIAGNOSIS — M5416 Radiculopathy, lumbar region: Secondary | ICD-10-CM

## 2017-01-29 MED ORDER — AMBULATORY NON FORMULARY MEDICATION: 0 refills | Status: DC

## 2017-01-29 NOTE — Telephone Encounter (Signed)
Printed Rx for [employer]. Will fax after appointment.

## 2017-02-02 ENCOUNTER — Ambulatory Visit (INDEPENDENT_AMBULATORY_CARE_PROVIDER_SITE_OTHER): Payer: Medicare HMO | Admitting: Family Medicine

## 2017-02-02 ENCOUNTER — Telehealth: Payer: Self-pay | Admitting: Family Medicine

## 2017-02-02 ENCOUNTER — Encounter: Payer: Self-pay | Admitting: Family Medicine

## 2017-02-02 VITALS — BP 132/76 | HR 58 | Ht 61.0 in | Wt 255.0 lb

## 2017-02-02 DIAGNOSIS — B351 Tinea unguium: Secondary | ICD-10-CM

## 2017-02-02 DIAGNOSIS — G8929 Other chronic pain: Secondary | ICD-10-CM | POA: Diagnosis not present

## 2017-02-02 DIAGNOSIS — M8588 Other specified disorders of bone density and structure, other site: Secondary | ICD-10-CM

## 2017-02-02 DIAGNOSIS — M5416 Radiculopathy, lumbar region: Secondary | ICD-10-CM

## 2017-02-02 DIAGNOSIS — M25562 Pain in left knee: Secondary | ICD-10-CM

## 2017-02-02 DIAGNOSIS — M79671 Pain in right foot: Secondary | ICD-10-CM | POA: Diagnosis not present

## 2017-02-02 DIAGNOSIS — M25561 Pain in right knee: Secondary | ICD-10-CM | POA: Diagnosis not present

## 2017-02-02 DIAGNOSIS — M170 Bilateral primary osteoarthritis of knee: Secondary | ICD-10-CM

## 2017-02-02 MED ORDER — TERBINAFINE HCL 250 MG PO TABS: 250.0000 mg | ORAL_TABLET | Freq: Every day | ORAL | 5 refills | Status: DC

## 2017-02-02 MED ORDER — GABAPENTIN 100 MG PO CAPS: ORAL_CAPSULE | ORAL | 3 refills | Status: DC

## 2017-02-02 NOTE — Progress Notes (Addendum)
Subjective:    Patient ID: Sharon Reilly, female    DOB: 06-07-1955, 61 y.o.   MRN: [id number]  HPI 61 year old female with a history of lumbar spondylosis, lupus and fibromyalgia comes in today complaining of pain in her feet.  She had custom orthotics made by Dr. Georgina Snell for hallux rigidus on the right as well as right heel pain.  She does think the toe may be has been a little bit better but the heel pain has still just been unbearable.  She is really only able to wear the orthotics for a few hours before becomes very bothersome.  She describes it as a stabbing sharp pain and bothers her as soon as she gets up in the morning and puts weight on her foot.  This feels like both of her feet hurt all over.  She did try the gabapentin that Dr. Georgina Snell gave her but says it was extremely sedating.  She also wanted to discuss her nail fungus.  Acting for of her nails on her right foot and a couple nails on her left foot.  She says it is not painful or bothersome but would like it treated.  It is actually turning her nails a black color.  She has not had any rash or itching or excess moisture between the toes on that foot.   Also wanted to know when she is due for next bone density test.  Her last one was in 2013 showing some osteopenia.  She does take vitamin D but does not take any calcium or any prescription bisphosphonates. She is willing try a lower strength I f available.  Review of Systems  BP 132/76   Pulse (!) 58   Ht 5\' 1"  (1.549 m)   Wt 255 lb (115.7 kg)   SpO2 100%   BMI 48.18 kg/m     Allergies  Allergen Reactions  . Amoxicillin Hives and Itching    No rash.   . Erythromycin Hives  . Sulfonamide Derivatives   . Doxycycline Hives  . Sulfa Antibiotics Hives and Rash    Past Medical History:  Diagnosis Date  . Fibromyalgia   . Hyperlipidemia   . Hypertension   . Lupus   . Lupus (systemic lupus erythematosus) ([place])   . NSTEMI (non-ST elevated myocardial infarction) ([place])  03/08/2016   [facility]  . Obesity   . Osteoarthritis   . Osteoporosis     Past Surgical History:  Procedure Laterality Date  . ABDOMINAL HYSTERECTOMY     partial  . BREAST SURGERY     reduction  . CARPAL TUNNEL RELEASE     left  . epicondylitis     left  . fistulotomy    . great toe fusion     left  . MRI syr ortho spec     Dr. Christen Butter  . REDUCTION MAMMAPLASTY    . right elbow    . surgisis biodesign fistula plug    . talonavicular joint fusion     LT foot    Social History   Socioeconomic History  . Marital status: Single    Spouse name: Not on file  . Number of children: Not on file  . Years of education: Not on file  . Highest education level: Not on file  Social Needs  . Financial resource strain: Not on file  . Food insecurity - worry: Not on file  . Food insecurity - inability: Not on file  . Transportation needs - medical: Not  on file  . Transportation needs - non-medical: Not on file  Occupational History  . Not on file  Tobacco Use  . Smoking status: Never Smoker  . Smokeless tobacco: Never Used  Substance and Sexual Activity  . Alcohol use: No  . Drug use: No  . Sexual activity: No  Other Topics Concern  . Not on file  Social History Narrative  . Not on file    Family History  Problem Relation Age of Onset  . Breast cancer Mother        Diagnosed in her 32's.  [name] Hyperlipidemia Mother   . Hypertension Mother   . Hyperlipidemia Father   . Diabetes Brother   . Diabetes Unknown        aunt  . Kidney failure Brother   . Thyroid disease Sister     Outpatient Encounter Medications as of 02/02/2017  Medication Sig  . acetaminophen (TYLENOL) 325 MG tablet Take 650 mg by mouth.  . AMBULATORY NON FORMULARY MEDICATION Medication Name: Call Huey Romans to get a download off her CPAP and fax to our office.  [phone]  . AMBULATORY NON FORMULARY MEDICATION Medication Name: roll about walker with seat and basket. Dx. M54.42  Lumbago with sciatica  .  aspirin 81 MG tablet Take 81 mg by mouth daily.    . diclofenac sodium (VOLTAREN) 1 % GEL Apply 2 g topically 4 (four) times daily. To her knees.  . fexofenadine (ALLEGRA) 180 MG tablet Take 1 tablet (180 mg total) by mouth daily.  . fluticasone (VERAMYST) 27.5 MCG/SPRAY nasal spray Place 2 sprays into the nose 3 (three) times daily as needed for rhinitis.  [name] gabapentin (NEURONTIN) 100 MG capsule 1 tab po QHS x 1 week, then OK to increase to BID  . losartan-hydrochlorothiazide (HYZAAR) 100-25 MG tablet take 1 tablet by mouth once daily  . metoprolol tartrate (LOPRESSOR) 100 MG tablet TAKE ONE TABLET BY MOUTH TWICE DAILY  . omeprazole (PRILOSEC) 40 MG capsule Take 1 capsule (40 mg total) by mouth 2 (two) times daily.  . rosuvastatin (CRESTOR) 10 MG tablet Take 10 mg by mouth daily.  [name] terbinafine (LAMISIL) 250 MG tablet Take 1 tablet (250 mg total) by mouth daily.  . Vitamin D, Ergocalciferol, (DRISDOL) 50000 units CAPS capsule   . [DISCONTINUED] methimazole (TAPAZOLE) 5 MG tablet Take 2.5 mg by mouth daily.   . [DISCONTINUED] simvastatin (ZOCOR) 20 MG tablet Take 1 tablet (20 mg total) by mouth at bedtime. *ADDITIONAL REFILLS WILL REQUIRE LABS. PLEASE CONTACT OFFICE TO GET THESE DONE*   No facility-administered encounter medications on file as of 02/02/2017.          Objective:   Physical Exam  Constitutional: She is oriented to person, place, and time. She appears well-developed and well-nourished.  HENT:  Head: Normocephalic and atraumatic.  Eyes: Conjunctivae and EOM are normal.  Cardiovascular: Normal rate.  Pulmonary/Chest: Effort normal.  Musculoskeletal:  Right heel tender with heel squeeze.  Not tender over the base of the heel or into the arch.  Redness erythema or abnormal lesions.  He has darkened brown toenails on her right great toenail as well as the third fourth and fifth toenails.  They are a little bit thickened as well.  No rash between the toes.  Neurological: She is  alert and oriented to person, place, and time.  Skin: Skin is dry. No pallor.  Psychiatric: She has a normal mood and affect. Her behavior is normal.  Vitals reviewed.  Assessment & Plan:  Right heel pain -she is tender over the heel lesion which is worse with lateral squeeze.  I do not think it consistent with plantar fasciitis I think she really has some heel pad injury or may be even atrophy.  I am going to put her in a heel cup for the next 2-3 weeks to see if this is helpful.  If not then please let us know.  Okay to continue with the orthotic as well as this is helping her hallux valgus.  Onychomycosis -Gus treatment options.  We will start oral Lamisil will need to recheck liver in about 1 month.  Osteopenia-due for bone density.  We will get that scheduled encouraged her to we will go downstairs and make an appointment.  We had ordered it previously but they may not have contacted her.  Pain in both feet-worth trying gabapentin again at a lower strength.  We will send over new prescription for 100 mg capsule.  She was previously tried on the 300 mg and found that excessively sedating.  Bilat knee pain -continues to have bilateral knee pain from osteoarthritis.  She did speak with the sports medicine docs to discuss possibly an injection.  She has not tried physical therapy yet is willing to do so.  Will refer for PT for her OA.

## 2017-02-02 NOTE — Telephone Encounter (Signed)
Call patient: [name] did prescribe the 300 mg gabapentin.  The lowest strength is actually 100 mg him to send over new prescription for the low-dose tab encouraged her to just take 1 at bedtime for the first week and then try going up to twice a day if tolerated.  We can see if this at least does help her pain.  Beatrice Lecher, MD

## 2017-02-02 NOTE — Patient Instructions (Addendum)
Call your insurance to separate see if they will cover physical therapy for your knees. Try a heel cup for your right heel at least for 2-3 weeks to see if it makes a difference. When she start the Lamisil let us recheck your liver in about 1 month.

## 2017-02-02 NOTE — Telephone Encounter (Signed)
Dr. Durwin Glaze called and stated that Medicaid told her she needed a referral for PT and that whatever office she was going to would have to get the authorization. I told her that I would let you know and we could send it to our [facility] location.  Can you please place the referral and I will get it sent over to [facility]. - CF

## 2017-02-02 NOTE — Telephone Encounter (Signed)
Pt notified to take gabapentin 100mg   One tab at bedtime for 1 week then increase to 2 if tolerated, pt verbalized understanding. KG LPN

## 2017-02-03 NOTE — Addendum Note (Signed)
Addended by: Beatrice Lecher D on: 02/03/2017 08:10 AM   Modules accepted: Orders

## 2017-02-03 NOTE — Telephone Encounter (Signed)
New order placed for [facility] location.

## 2017-02-10 ENCOUNTER — Ambulatory Visit (INDEPENDENT_AMBULATORY_CARE_PROVIDER_SITE_OTHER): Payer: Medicare HMO

## 2017-02-10 DIAGNOSIS — M8588 Other specified disorders of bone density and structure, other site: Secondary | ICD-10-CM | POA: Diagnosis not present

## 2017-02-10 DIAGNOSIS — Z780 Asymptomatic menopausal state: Secondary | ICD-10-CM | POA: Diagnosis not present

## 2017-02-11 ENCOUNTER — Ambulatory Visit: Payer: Medicare HMO | Admitting: Physical Therapy

## 2017-02-17 ENCOUNTER — Ambulatory Visit: Payer: Medicare HMO | Attending: Family Medicine | Admitting: Physical Therapy

## 2017-02-17 ENCOUNTER — Other Ambulatory Visit: Payer: Self-pay

## 2017-02-17 ENCOUNTER — Encounter: Payer: Self-pay | Admitting: Physical Therapy

## 2017-02-17 DIAGNOSIS — G8929 Other chronic pain: Secondary | ICD-10-CM | POA: Diagnosis not present

## 2017-02-17 DIAGNOSIS — M25561 Pain in right knee: Secondary | ICD-10-CM | POA: Insufficient documentation

## 2017-02-17 DIAGNOSIS — R29898 Other symptoms and signs involving the musculoskeletal system: Secondary | ICD-10-CM | POA: Diagnosis not present

## 2017-02-17 DIAGNOSIS — M25562 Pain in left knee: Secondary | ICD-10-CM | POA: Diagnosis not present

## 2017-02-17 DIAGNOSIS — R262 Difficulty in walking, not elsewhere classified: Secondary | ICD-10-CM | POA: Insufficient documentation

## 2017-02-17 NOTE — Patient Instructions (Signed)
Strengthening: Straight Leg Raise (Phase 1)   Tighten muscles on front of right thigh, then lift leg __8__ inches from surface, keeping knee locked.  Repeat __15__ times per set. Do __2__ sets per session.   Bridge   Lie back, legs bent. Inhale, pressing hips up. Keeping ribs in, lengthen lower back. Exhale, rolling down along spine from top. Repeat __15__ times. Do ___2_ sessions per day.  Straight Leg Raise: With External Leg Rotation    Lie on back with right leg straight, opposite leg bent. Rotate straight leg out and lift __8__ inches. Repeat __15__ times per set. Do __2__ sets per session.   Long [employer]   Straighten operated leg and try to hold it __5__ seconds. Use __-__ lbs on ankle. Repeat __15__ times. Do __2__ sessions a day.  Mini Squat: Double Leg   With feet shoulder width apart, reach forward for balance and do a mini squat. Keep knees in line with second toe. Knees do not go past toes. Repeat _15__ times per set.   Heel Raise: Bilateral (Standing)   Rise on balls of feet. Repeat __15__ times per set. Do __2_ sets per session.  **hold onto support**

## 2017-02-17 NOTE — Therapy (Signed)
[facility] [address]  [place], [place], [address] Phone: [phone]   Fax:  [phone]  Physical Therapy Evaluation  Patient Details  Name: Sharon Reilly MRN: [id number] Date of Birth: March 19, 1955 Referring Provider: Dr. Beatrice Lecher   Encounter Date: 02/17/2017  PT End of Session - 02/17/17 1453    Visit Number  1    Number of Visits  12    Date for PT Re-Evaluation  03/31/17    Authorization Type  Medicare and Medicaid    PT Start Time  1352    PT Stop Time  1433    PT Time Calculation (min)  41 min    Activity Tolerance  Patient tolerated treatment well    Behavior During Therapy  [facility] for tasks assessed/performed       Past Medical History:  Diagnosis Date  . Fibromyalgia   . Hyperlipidemia   . Hypertension   . Lupus   . Lupus (systemic lupus erythematosus) ([place])   . NSTEMI (non-ST elevated myocardial infarction) ([place]) 03/08/2016   [facility]  . Obesity   . Osteoarthritis   . Osteoporosis     Past Surgical History:  Procedure Laterality Date  . ABDOMINAL HYSTERECTOMY     partial  . BREAST SURGERY     reduction  . CARPAL TUNNEL RELEASE     left  . epicondylitis     left  . fistulotomy    . great toe fusion     left  . MRI syr ortho spec     Dr. Christen Butter  . REDUCTION MAMMAPLASTY    . right elbow    . surgisis biodesign fistula plug    . talonavicular joint fusion     LT foot    There were no vitals filed for this visit.   Subjective Assessment - 02/17/17 1354    Subjective  Patient reports a history of back pain - pain from back to B LE. Started having R plantar foot pain - got orthotics - feels like pain is worse. B knee pain - "hurts like the dickens" - fearful of falling - feels like B knees will give out. 1 fall in the past 6 months, was wlaking in living room and navigating around obstacle and fell onto chair.     Pertinent History  HTN, thyroid dysfunction, fibromyalgia    Limitations  Standing;Walking    Diagnostic tests  xrays - B knees - arthritis per patient    Patient Stated Goals  improve pain and walking    Currently in Pain?  Yes    Pain Score  5     Pain Location  Knee    Pain Orientation  Right;Left    Pain Descriptors / Indicators  Aching;Dull sharp with activity    Pain Type  Chronic pain    Aggravating Factors   pronling standing, walking    Pain Relieving Factors  Voltaren gel, heat         [facility] PT Assessment - 02/17/17 1404      Assessment   Medical Diagnosis  Bilateral primary OA of knee    Referring Provider  Dr. Beatrice Lecher    Onset Date/Surgical Date  -- ~1 year ago    Next MD Visit  prn    Prior Therapy  not for this issue      Precautions   Precautions  None      Restrictions   Weight Bearing Restrictions  No      Balance Screen   Has the patient fallen in the past 6 months  Yes    How many times?  1    Has the patient had a decrease in activity level because of a fear of falling?   Yes    Is the patient reluctant to leave their home because of a fear of falling?   No      Home Environment   Living Environment  Private residence    Living Arrangements  Alone    Type of [place] Access  Elevator;Stairs to enter    Entrance Stairs-Number of Steps  12    [place]  One level    [place] - single point;Walker - 4 wheels      Prior Function   Level of Independence  Independent    Vocation  On disability      Cognition   Overall Cognitive Status  Within Functional Limits for tasks assessed      Observation/Other Assessments   Focus on Therapeutic Outcomes (FOTO)   Knee: 42 (58% limited, predicted 47% limited)      Sensation   Light Touch  Appears Intact      Coordination   Gross Motor Movements are Fluid and Coordinated  Yes      Posture/Postural Control   Posture/Postural Control  Postural limitations    Postural Limitations  Rounded  Shoulders;Forward head      ROM / Strength   AROM / PROM / Strength  AROM;Strength      AROM   Overall AROM Comments  B knees full AROM and symmetrical    AROM Assessment Site  Knee      Strength   Strength Assessment Site  Hip;Knee    Right/Left Hip  Right;Left    Right Hip Flexion  4-/5    Left Hip Flexion  4-/5    Right/Left Knee  Right;Left    Right Knee Flexion  4/5    Right Knee Extension  4/5    Left Knee Flexion  4/5    Left Knee Extension  4/5      Flexibility   Soft Tissue Assessment /Muscle Length  yes    Hamstrings  B WNL - no pain      Palpation   Patella mobility  good mobility in all planes    Palpation comment  slight tenderness - B knee - anterior      Ambulation/Gait   Ambulation/Gait  Yes    Ambulation/Gait Assistance  6: Modified independent (Device/Increase time)    Ambulation Distance (Feet)  100 Feet    Assistive device  Straight cane    Gait Pattern  Step-through pattern;Decreased stance time - right;Decreased stance time - left;Decreased stride length;Antalgic    Ambulation Surface  Level;Indoor             Objective measurements completed on examination: See above findings.      [facility] Adult PT Treatment/Exercise - 02/17/17 1404      Exercises   Exercises  Knee/Hip      Knee/Hip Exercises: Standing   Heel Raises  Both;10 reps    Heel Raises Limitations  VC to reduce forward motion    Functional Squat  10 reps    Functional Squat Limitations  small motion due to pain      Knee/Hip Exercises: Seated   Long [address]  Both;10 reps      Knee/Hip Exercises: Supine   Bridges  Both;10 reps    Straight Leg Raises  Right;Left;10 reps    Straight Leg Raise with External Rotation  Right;Left;5 reps             PT Education - 02/17/17 1452    Education provided  Yes    Education Details  exam findings, POC, HEP    Person(s) Educated  Patient    Methods  Explanation;Demonstration;Handout    Comprehension  Verbalized  understanding;Returned demonstration;Need further instruction          PT Long Term Goals - 02/17/17 1501      PT LONG TERM GOAL #1   Title  patient to be independent with advanced HEP    Status  New    Target Date  03/31/17      PT LONG TERM GOAL #2   Title  patient to improve B LE strength to >/= 4+/5 for improved mobility    Status  New    Target Date  03/31/17      PT LONG TERM GOAL #3   Title  patient to report initiation and maitenance of walking program for >/= 3 days/week    Status  New    Target Date  03/31/17      PT LONG TERM GOAL #4   Title  patient to report pain no greater than 2/10 with walking    Status  New    Target Date  03/31/17      PT LONG TERM GOAL #5   Title  patient to report ability to perform ADLs and household chores wihtout pain limiting    Status  New    Target Date  03/31/17             Plan - 02/17/17 1455    Clinical Impression Statement  Ms. Brunn is a 62 y/o female presenting to OPPT today regarding B knee pain of seemingly insidious onset. Patient today ambulating with SPC, and further stating use of rollator intermittently. Patient today with full and symmetrical B knee AROM, however reduced strength bilaterally. Patient reporting difficulty participating with daily walking activities and household chores due to pain. Patient to benefit from PT to address pain and functional mobility limitations to allow for improved QOL.     Clinical Presentation  Stable    Clinical Decision Making  Low    Rehab Potential  Good    PT Frequency  2x / week    PT Duration  6 weeks    PT Treatment/Interventions  ADLs/Self Care Home Management;Cryotherapy;Electrical Stimulation;Iontophoresis 4mg /ml Dexamethasone;Moist Heat;Therapeutic exercise;Therapeutic activities;Functional mobility training;Stair training;Gait training;DME Instruction;Balance training;Neuromuscular re-education;Patient/family education;Manual techniques;Vasopneumatic  Device;Taping;Dry needling;Passive range of motion    Consulted and Agree with Plan of Care  Patient       Patient will benefit from skilled therapeutic intervention in order to improve the following deficits and impairments:  Abnormal gait, Decreased activity tolerance, Decreased mobility, Difficulty walking, Pain, Decreased strength  Visit Diagnosis: Chronic pain of right knee - Plan: PT plan of care cert/re-cert  Chronic pain of left knee - Plan: PT plan of care cert/re-cert  Other symptoms and signs involving the musculoskeletal system - Plan: PT plan of care cert/re-cert  Difficulty in walking, not elsewhere classified - Plan: PT plan of care cert/re-cert     Problem List Patient Active Problem List   Diagnosis Date Noted  . Hallux rigidus of right foot 09/29/2016  .  Plantar fasciitis of right foot 09/29/2016  . Bilateral knee pain 09/29/2016  . CAD (coronary artery disease) 03/09/2016  . NSTEMI (non-ST elevated myocardial infarction) ([place]) 03/08/2016  . Internal hemorrhoids 10/14/2015  . Gastroesophageal reflux disease with esophagitis 08/14/2015  . Lymphadenopathy of right cervical region 08/05/2015  . Lumbago with sciatica 11/07/2014  . Left thyroid nodule 02/01/2014  . Nonpalpable mass of neck 02/01/2014  . OSA (obstructive sleep apnea) 10/04/2013  . Lactose intolerance 05/31/2013  . IFG (impaired fasting glucose) 09/01/2012  . Osteoarthritis 07/10/2010  . Fibromyalgia 07/10/2010  . Tubular adenoma of colon 07/10/2010  . Anal fistula 07/10/2010  . Lumbar spondylosis 07/03/2010  . Lumbar radiculopathy 07/02/2010  . SLE (systemic lupus erythematosus) ([place]) 06/26/2010  . Essential hypertension, benign 06/26/2010  . Morbid obesity ([place]) 06/26/2010  . HYPERLIPIDEMIA 05/20/2010  . OSTEOPOROSIS 05/20/2010     Lanney Gins, PT, DPT 02/17/17 3:07 PM   [facility] [address]  [place],  [place], [address] Phone: [phone]   Fax:  [phone]  Name: ANEKA FAGERSTROM MRN: [id number] Date of Birth: 04/29/1955

## 2017-03-01 ENCOUNTER — Ambulatory Visit: Payer: Medicare HMO | Admitting: Physical Therapy

## 2017-03-01 ENCOUNTER — Encounter: Payer: Self-pay | Admitting: Physical Therapy

## 2017-03-01 DIAGNOSIS — R262 Difficulty in walking, not elsewhere classified: Secondary | ICD-10-CM

## 2017-03-01 DIAGNOSIS — G8929 Other chronic pain: Secondary | ICD-10-CM | POA: Diagnosis not present

## 2017-03-01 DIAGNOSIS — M25561 Pain in right knee: Secondary | ICD-10-CM | POA: Diagnosis not present

## 2017-03-01 DIAGNOSIS — R29898 Other symptoms and signs involving the musculoskeletal system: Secondary | ICD-10-CM

## 2017-03-01 DIAGNOSIS — M25562 Pain in left knee: Secondary | ICD-10-CM

## 2017-03-01 NOTE — Patient Instructions (Signed)
Hamstring Curl: Resisted (Sitting)   Facing anchor with tubing on right ankle, leg straight out, bend knee. Repeat _15___ times per set. Do __2__ sets per session.   Hip Flexion   Standing on one leg, bring other leg up so thigh is parallel to floor.  Repeat on other leg. Do __10-15__ repetitions, __2__ sets.  Hip Extension (Hip Flexibility)   Stand with support, _-__ lb weights on ankles. Breathe in. With knee straight, slowly bring one leg back, breathing out through pursed lips. Keep foot flexed so it does not touch floor. Return slowly, breathing in. Repeat _10-15__ times. Repeat with other leg. Do __2_ sessions per day.  HIP: Abduction - Standing   Squeeze glutes. Raise leg out and slightly back. _10-15__ reps per set, __2_ sets per day. Hold onto a support.

## 2017-03-01 NOTE — Therapy (Signed)
[facility] [address]  [place], [place], [address] Phone: [phone]   Fax:  [phone]  Physical Therapy Treatment  Patient Details  Name: Sharon Reilly MRN: [id number] Date of Birth: 07/17/1955 Referring Provider: Dr. Beatrice Lecher   Encounter Date: 03/01/2017  PT End of Session - 03/01/17 1404    Visit Number  2    Number of Visits  12    Date for PT Re-Evaluation  03/31/17    Authorization Type  Medicare and Medicaid    Authorization Time Period  02/24/17 - 03/26/17    Authorization - Visit Number  1    Authorization - Number of Visits  3    PT Start Time  1309    PT Stop Time  1352    PT Time Calculation (min)  43 min    Activity Tolerance  Patient tolerated treatment well    Behavior During Therapy  [facility] for tasks assessed/performed       Past Medical History:  Diagnosis Date  . Fibromyalgia   . Hyperlipidemia   . Hypertension   . Lupus   . Lupus (systemic lupus erythematosus) ([place])   . NSTEMI (non-ST elevated myocardial infarction) ([place]) 03/08/2016   [facility]  . Obesity   . Osteoarthritis   . Osteoporosis     Past Surgical History:  Procedure Laterality Date  . ABDOMINAL HYSTERECTOMY     partial  . BREAST SURGERY     reduction  . CARPAL TUNNEL RELEASE     left  . epicondylitis     left  . fistulotomy    . great toe fusion     left  . MRI syr ortho spec     Dr. Christen Butter  . REDUCTION MAMMAPLASTY    . right elbow    . surgisis biodesign fistula plug    . talonavicular joint fusion     LT foot    There were no vitals filed for this visit.  Subjective Assessment - 03/01/17 1313    Subjective  not her best day - B knee pain    Pertinent History  HTN, thyroid dysfunction, fibromyalgia    Diagnostic tests  xrays - B knees - arthritis per patient    Patient Stated Goals  improve pain and walking    Currently in Pain?  Yes    Pain Score  5     Pain Location  Knee    Pain  Orientation  Right;Left more so in R knee    Pain Descriptors / Indicators  Aching;Throbbing    Pain Type  Chronic pain                      [facility] Adult PT Treatment/Exercise - 03/01/17 0001      Knee/Hip Exercises: Aerobic   Nustep  L4 x 6 min      Knee/Hip Exercises: Standing   Heel Raises  Both;10 reps    Heel Raises Limitations  little height    Hip Flexion  Stengthening;Both;15 reps;Knee bent 2# at ankle - high knee march    Hip Abduction  Stengthening;Both;10 reps;Knee straight 2# at ankle - B UE support    Hip Extension  Stengthening;Both;15 reps;Knee straight 2#    Forward Step Up  Right;Left;10 reps;Hand Hold: 1;Step Height: 8" fearful steppin gback off of step    Forward Step Up Limitations  only 5 up/down with L LE due to pain and fear  Knee/Hip Exercises: Seated   Long [employer]  Strengthening;Both;15 reps;Weights    Long Arc Quad Weight  2 lbs.    Other Seated Knee/Hip Exercises  B LE - fitter - 2 blue bands x 15 reps each LE    Hamstring Curl  Strengthening;Both;15 reps red tband    Abduction/Adduction   Strengthening;Both;10 reps 5 sec hold - adduction ball squeeze                  PT Long Term Goals - 03/01/17 1405      PT LONG TERM GOAL #1   Title  patient to be independent with advanced HEP    Status  On-going      PT LONG TERM GOAL #2   Title  patient to improve B LE strength to >/= 4+/5 for improved mobility    Status  On-going      PT LONG TERM GOAL #3   Title  patient to report initiation and maitenance of walking program for >/= 3 days/week    Status  On-going      PT LONG TERM GOAL #4   Title  patient to report pain no greater than 2/10 with walking    Status  On-going      PT LONG TERM GOAL #5   Title  patient to report ability to perform ADLs and household chores wihtout pain limiting    Status  On-going            Plan - 03/01/17 1405    Clinical Impression Statement  Sharon Reilly doing well today - does  continue to report pain levels of 5/10 along with grimacing and gaurding during treatment session. Reports back pain with bridge from HEP, thus deferred and educated patient to remove from HEP to prevent further pain with good understanding. Updated HEP to include standing ther ex with good carryover. Will continue to progress towards goals.     PT Treatment/Interventions  ADLs/Self Care Home Management;Cryotherapy;Electrical Stimulation;Iontophoresis 4mg /ml Dexamethasone;Moist Heat;Therapeutic exercise;Therapeutic activities;Functional mobility training;Stair training;Gait training;DME Instruction;Balance training;Neuromuscular re-education;Patient/family education;Manual techniques;Vasopneumatic Device;Taping;Dry needling;Passive range of motion    Consulted and Agree with Plan of Care  Patient       Patient will benefit from skilled therapeutic intervention in order to improve the following deficits and impairments:  Abnormal gait, Decreased activity tolerance, Decreased mobility, Difficulty walking, Pain, Decreased strength  Visit Diagnosis: Chronic pain of right knee  Chronic pain of left knee  Other symptoms and signs involving the musculoskeletal system  Difficulty in walking, not elsewhere classified     Problem List Patient Active Problem List   Diagnosis Date Noted  . Hallux rigidus of right foot 09/29/2016  . Plantar fasciitis of right foot 09/29/2016  . Bilateral knee pain 09/29/2016  . CAD (coronary artery disease) 03/09/2016  . NSTEMI (non-ST elevated myocardial infarction) ([place]) 03/08/2016  . Internal hemorrhoids 10/14/2015  . Gastroesophageal reflux disease with esophagitis 08/14/2015  . Lymphadenopathy of right cervical region 08/05/2015  . Lumbago with sciatica 11/07/2014  . Left thyroid nodule 02/01/2014  . Nonpalpable mass of neck 02/01/2014  . OSA (obstructive sleep apnea) 10/04/2013  . Lactose intolerance 05/31/2013  . IFG (impaired fasting glucose)  09/01/2012  . Osteoarthritis 07/10/2010  . Fibromyalgia 07/10/2010  . Tubular adenoma of colon 07/10/2010  . Anal fistula 07/10/2010  . Lumbar spondylosis 07/03/2010  . Lumbar radiculopathy 07/02/2010  . SLE (systemic lupus erythematosus) ([place]) 06/26/2010  . Essential hypertension, benign 06/26/2010  . Morbid obesity ([place]) 06/26/2010  .  HYPERLIPIDEMIA 05/20/2010  . OSTEOPOROSIS 05/20/2010     Lanney Gins, PT, DPT 03/01/17 2:08 PM   [facility] [address]  [place], [place], [address] Phone: [phone]   Fax:  [phone]  Name: Sharon Reilly MRN: [id number] Date of Birth: August 28, 1955

## 2017-03-03 ENCOUNTER — Encounter: Payer: Self-pay | Admitting: Family Medicine

## 2017-03-03 ENCOUNTER — Ambulatory Visit (INDEPENDENT_AMBULATORY_CARE_PROVIDER_SITE_OTHER): Payer: Medicare HMO | Admitting: Family Medicine

## 2017-03-03 VITALS — BP 128/72 | HR 61 | Ht 61.0 in | Wt 249.0 lb

## 2017-03-03 DIAGNOSIS — K2100 Gastro-esophageal reflux disease with esophagitis, without bleeding: Secondary | ICD-10-CM

## 2017-03-03 DIAGNOSIS — B351 Tinea unguium: Secondary | ICD-10-CM | POA: Diagnosis not present

## 2017-03-03 DIAGNOSIS — Z79899 Other long term (current) drug therapy: Secondary | ICD-10-CM | POA: Diagnosis not present

## 2017-03-03 DIAGNOSIS — G8929 Other chronic pain: Secondary | ICD-10-CM | POA: Diagnosis not present

## 2017-03-03 DIAGNOSIS — M79671 Pain in right foot: Secondary | ICD-10-CM | POA: Diagnosis not present

## 2017-03-03 DIAGNOSIS — M170 Bilateral primary osteoarthritis of knee: Secondary | ICD-10-CM | POA: Diagnosis not present

## 2017-03-03 DIAGNOSIS — R3915 Urgency of urination: Secondary | ICD-10-CM | POA: Diagnosis not present

## 2017-03-03 LAB — POCT URINALYSIS DIPSTICK
Bilirubin, UA: NEGATIVE
Glucose, UA: NEGATIVE
Ketones, UA: NEGATIVE
LEUKOCYTES UA: NEGATIVE
NITRITE UA: NEGATIVE
PH UA: 5.5 (ref 5.0–8.0)
Protein, UA: NEGATIVE
Spec Grav, UA: 1.015 (ref 1.010–1.025)
UROBILINOGEN UA: 0.2 U/dL

## 2017-03-03 LAB — HEPATIC FUNCTION PANEL
AG Ratio: 1.3 (calc) (ref 1.0–2.5)
ALKALINE PHOSPHATASE (APISO): 91 U/L (ref 33–130)
ALT: 11 U/L (ref 6–29)
AST: 17 U/L (ref 10–35)
Albumin: 3.8 g/dL (ref 3.6–5.1)
BILIRUBIN INDIRECT: 0.2 mg/dL (ref 0.2–1.2)
Bilirubin, Direct: 0.1 mg/dL (ref 0.0–0.2)
Globulin: 2.9 g/dL (calc) (ref 1.9–3.7)
TOTAL PROTEIN: 6.7 g/dL (ref 6.1–8.1)
Total Bilirubin: 0.3 mg/dL (ref 0.2–1.2)

## 2017-03-03 NOTE — Progress Notes (Signed)
Subjective:    Patient ID: Sharon Reilly, female    DOB: [birth year], 62 y.o.   MRN: [id number]  HPI  Right heel pain -she has not had a chance to try a heel cup yet.  She wants to get a new pair of sneakers first.  Oncyhomycosis - has started oral lamisil.  She is tolerating the medication well without any side effects.  Pain feet, bilat -she is been to 2 therapy sessions and says she is Artie starting to notices some improvement which is fantastic.  She has been trying the gabapentin at bedtime as needed and that has been helpful.  And not nearly as sedating as a 300 mg dose. Bilat knee pain -going to therapy and it has been helping some.  She has noticed some urinary urgency since she was last here.  Twice she is actually had an accident.  She denies any incontinence with coughing or sneezing.  She is concerned because she did have surgery on her bladder years ago.  She had an adhesion between a fibroid in her bladder that required surgical excision.  Also had an increase in heartburn and reflux symptoms.  She is Artie on Prilosec twice a day but typically takes it with her meal in the morning.  She is been on a fasting diet for the last 2 weeks with only vegetables and fruit and says it really just seems to worsen her reflux.  Not bothering her every time she eats.  She is not taking any additional medication for it.  Review of Systems   BP 128/72   Pulse 61   Ht 5\' 1"  (1.549 m)   Wt 249 lb (112.9 kg)   SpO2 97%   BMI 47.05 kg/m     Allergies  Allergen Reactions  . Amoxicillin Hives and Itching    No rash.   . Erythromycin Hives  . Sulfonamide Derivatives   . Doxycycline Hives  . Sulfa Antibiotics Hives and Rash    Past Medical History:  Diagnosis Date  . Fibromyalgia   . Hyperlipidemia   . Hypertension   . Lupus   . Lupus (systemic lupus erythematosus) ([place])   . NSTEMI (non-ST elevated myocardial infarction) ([place]) 03/08/2016   [facility]  . Obesity   . Osteoarthritis    . Osteoporosis     Past Surgical History:  Procedure Laterality Date  . ABDOMINAL HYSTERECTOMY     partial  . BREAST SURGERY     reduction  . CARPAL TUNNEL RELEASE     left  . epicondylitis     left  . fistulotomy    . great toe fusion     left  . MRI syr ortho spec     Dr. Christen Butter  . REDUCTION MAMMAPLASTY    . right elbow    . surgisis biodesign fistula plug    . talonavicular joint fusion     LT foot    Social History   Socioeconomic History  . Marital status: Single    Spouse name: Not on file  . Number of children: Not on file  . Years of education: Not on file  . Highest education level: Not on file  Social Needs  . Financial resource strain: Not on file  . Food insecurity - worry: Not on file  . Food insecurity - inability: Not on file  . Transportation needs - medical: Not on file  . Transportation needs - non-medical: Not on file  Occupational History  .  Not on file  Tobacco Use  . Smoking status: Never Smoker  . Smokeless tobacco: Never Used  Substance and Sexual Activity  . Alcohol use: No  . Drug use: No  . Sexual activity: No  Other Topics Concern  . Not on file  Social History Narrative  . Not on file    Family History  Problem Relation Age of Onset  . Breast cancer Mother        Diagnosed in her 21's.  [name] Hyperlipidemia Mother   . Hypertension Mother   . Hyperlipidemia Father   . Diabetes Brother   . Diabetes Unknown        aunt  . Kidney failure Brother   . Thyroid disease Sister     Outpatient Encounter Medications as of 03/03/2017  Medication Sig  . acetaminophen (TYLENOL) 325 MG tablet Take 650 mg by mouth.  . AMBULATORY NON FORMULARY MEDICATION Medication Name: Call Huey Romans to get a download off her CPAP and fax to our office.  [phone]  . AMBULATORY NON FORMULARY MEDICATION Medication Name: roll about walker with seat and basket. Dx. M54.42  Lumbago with sciatica  . aspirin 81 MG tablet Take 81 mg by mouth daily.    .  diclofenac sodium (VOLTAREN) 1 % GEL Apply 2 g topically 4 (four) times daily. To her knees.  . fexofenadine (ALLEGRA) 180 MG tablet Take 1 tablet (180 mg total) by mouth daily.  . fluticasone (VERAMYST) 27.5 MCG/SPRAY nasal spray Place 2 sprays into the nose 3 (three) times daily as needed for rhinitis.  [name] gabapentin (NEURONTIN) 100 MG capsule 1 tab po QHS x 1 week, then OK to increase to BID  . losartan-hydrochlorothiazide (HYZAAR) 100-25 MG tablet take 1 tablet by mouth once daily  . metoprolol tartrate (LOPRESSOR) 100 MG tablet TAKE ONE TABLET BY MOUTH TWICE DAILY  . omeprazole (PRILOSEC) 40 MG capsule Take 1 capsule (40 mg total) by mouth 2 (two) times daily.  . rosuvastatin (CRESTOR) 10 MG tablet Take 10 mg by mouth daily.  [name] terbinafine (LAMISIL) 250 MG tablet Take 1 tablet (250 mg total) by mouth daily.  . Vitamin D, Ergocalciferol, (DRISDOL) 50000 units CAPS capsule    No facility-administered encounter medications on file as of 03/03/2017.           Objective:   Physical Exam  Constitutional: She is oriented to person, place, and time. She appears well-developed and well-nourished.  HENT:  Head: Normocephalic and atraumatic.  Cardiovascular: Normal rate, regular rhythm and normal heart sounds.  Pulmonary/Chest: Effort normal and breath sounds normal.  Neurological: She is alert and oriented to person, place, and time.  Skin: Skin is warm and dry.  Psychiatric: She has a normal mood and affect. Her behavior is normal.          Assessment & Plan:  Right heel pain-still recommend a trial of a heel cup when she gets some new sneakers.  Onychomycosis-doing well with the Lamisil.  Due to recheck liver function.  Bilateral leg and knee pain-improving with physical therapy.  Continue gabapentin at bedtime as needed.  Urinary urgency-we will evaluate for UTI.  If negative then plan will be to refer to urology for further evaluation.  I did verify that this is not a side  effect of the Lamisil.  GERD-definitely encouraged her to stay away from more acidic fruits and vegetables.  In addition move the omeprazole to 30 minutes before first meal the day and then the second tab at  bedtime and see if that helps.  She can certainly use Tums as needed.  And can always consider a trial of Carafate but she really does not want to add another prescription pill at this point.  She plans on continuing her fast for about 2 more weeks at this point.

## 2017-03-03 NOTE — Addendum Note (Signed)
Addended by: Teddy Spike on: 03/03/2017 01:54 PM   Modules accepted: Orders

## 2017-03-04 LAB — URINE CULTURE
MICRO NUMBER: 90066592
Result:: NO GROWTH
SPECIMEN QUALITY: ADEQUATE

## 2017-03-04 NOTE — Progress Notes (Signed)
All labs are normal. 

## 2017-03-08 ENCOUNTER — Ambulatory Visit: Payer: Medicare HMO | Admitting: Physical Therapy

## 2017-03-08 ENCOUNTER — Encounter: Payer: Self-pay | Admitting: Physical Therapy

## 2017-03-08 DIAGNOSIS — M25562 Pain in left knee: Secondary | ICD-10-CM | POA: Diagnosis not present

## 2017-03-08 DIAGNOSIS — R262 Difficulty in walking, not elsewhere classified: Secondary | ICD-10-CM

## 2017-03-08 DIAGNOSIS — M25561 Pain in right knee: Secondary | ICD-10-CM | POA: Diagnosis not present

## 2017-03-08 DIAGNOSIS — R29898 Other symptoms and signs involving the musculoskeletal system: Secondary | ICD-10-CM | POA: Diagnosis not present

## 2017-03-08 DIAGNOSIS — G8929 Other chronic pain: Secondary | ICD-10-CM | POA: Diagnosis not present

## 2017-03-08 NOTE — Patient Instructions (Signed)
Sit to Stand / Stand to Sit / Transfers   Sit on edge of a solid chair with arms, feet flat on floor. Lean forward over feet and stand up with hands on chair arms. Sit down slowly with hands on chair arms. Repeat __10-15__ times per session.   Side stepping with band around ankles  Forward stepping band around ankles

## 2017-03-08 NOTE — Therapy (Signed)
[facility] [address]  [place], [place], [address] Phone: [phone]   Fax:  [phone]  Physical Therapy Treatment  Patient Details  Name: Sharon Reilly MRN: [id number] Date of Birth: 12/11/1955 Referring Provider: Dr. Beatrice Lecher   Encounter Date: 03/08/2017  PT End of Session - 03/08/17 1311    Visit Number  3    Number of Visits  12    Date for PT Re-Evaluation  03/31/17    Authorization Type  Medicare and Medicaid    Authorization Time Period  02/24/17 - 03/26/17    Authorization - Visit Number  2    Authorization - Number of Visits  3    PT Start Time  1306    PT Stop Time  1346    PT Time Calculation (min)  40 min    Activity Tolerance  Patient tolerated treatment well    Behavior During Therapy  [facility] for tasks assessed/performed       Past Medical History:  Diagnosis Date  . Fibromyalgia   . Hyperlipidemia   . Hypertension   . Lupus   . Lupus (systemic lupus erythematosus) ([place])   . NSTEMI (non-ST elevated myocardial infarction) ([place]) 03/08/2016   [facility]  . Obesity   . Osteoarthritis   . Osteoporosis     Past Surgical History:  Procedure Laterality Date  . ABDOMINAL HYSTERECTOMY     partial  . BREAST SURGERY     reduction  . CARPAL TUNNEL RELEASE     left  . epicondylitis     left  . fistulotomy    . great toe fusion     left  . MRI syr ortho spec     Dr. Christen Butter  . REDUCTION MAMMAPLASTY    . right elbow    . surgisis biodesign fistula plug    . talonavicular joint fusion     LT foot    There were no vitals filed for this visit.  Subjective Assessment - 03/08/17 1309    Subjective  doing well - feels like cold is affecting her knees; feels like her knees want to lock up sometimes    Pertinent History  HTN, thyroid dysfunction, fibromyalgia    Patient Stated Goals  improve pain and walking    Currently in Pain?  Yes    Pain Score  4     Pain Location  Knee    Pain  Orientation  Right;Left    Pain Descriptors / Indicators  Aching;Sore;Discomfort                      [facility] Adult PT Treatment/Exercise - 03/08/17 0001      Knee/Hip Exercises: Stretches   Gastroc Stretch  Right;Left;2 reps;30 seconds prostretch      Knee/Hip Exercises: Machines for Strengthening   Cybex Knee Extension  5# - B LE 2 x 15    Cybex Knee Flexion  10# - B LE 2 x 15      Knee/Hip Exercises: Standing   Heel Raises  Both;10 reps    Heel Raises Limitations  patient reports arthritis in toes    Hip Flexion  Stengthening;Both;15 reps;Knee bent    Functional Squat  10 reps B UE support    Other Standing Knee Exercises  B side stepping x 20 feet; fwd monster walks 2 x 20 feet    Other Standing Knee Exercises  toe raise x 10 reps  Knee/Hip Exercises: Seated   Sit to Sand  10 reps;with UE support                  PT Long Term Goals - 03/01/17 1405      PT LONG TERM GOAL #1   Title  patient to be independent with advanced HEP    Status  On-going      PT LONG TERM GOAL #2   Title  patient to improve B LE strength to >/= 4+/5 for improved mobility    Status  On-going      PT LONG TERM GOAL #3   Title  patient to report initiation and maitenance of walking program for >/= 3 days/week    Status  On-going      PT LONG TERM GOAL #4   Title  patient to report pain no greater than 2/10 with walking    Status  On-going      PT LONG TERM GOAL #5   Title  patient to report ability to perform ADLs and household chores wihtout pain limiting    Status  On-going            Plan - 03/08/17 1348    Clinical Impression Statement  patient doing well today - limited motivatioin without cueing, as patient reports pain with all activities. Conitnued updates to HEP to allow for indendent progress at home. Will continue to progress towards goals.     PT Treatment/Interventions  ADLs/Self Care Home Management;Cryotherapy;Electrical  Stimulation;Iontophoresis 4mg /ml Dexamethasone;Moist Heat;Therapeutic exercise;Therapeutic activities;Functional mobility training;Stair training;Gait training;DME Instruction;Balance training;Neuromuscular re-education;Patient/family education;Manual techniques;Vasopneumatic Device;Taping;Dry needling;Passive range of motion    Consulted and Agree with Plan of Care  Patient       Patient will benefit from skilled therapeutic intervention in order to improve the following deficits and impairments:  Abnormal gait, Decreased activity tolerance, Decreased mobility, Difficulty walking, Pain, Decreased strength  Visit Diagnosis: Chronic pain of right knee  Chronic pain of left knee  Other symptoms and signs involving the musculoskeletal system  Difficulty in walking, not elsewhere classified     Problem List Patient Active Problem List   Diagnosis Date Noted  . Hallux rigidus of right foot 09/29/2016  . Plantar fasciitis of right foot 09/29/2016  . Bilateral knee pain 09/29/2016  . CAD (coronary artery disease) 03/09/2016  . NSTEMI (non-ST elevated myocardial infarction) ([place]) 03/08/2016  . Internal hemorrhoids 10/14/2015  . Gastroesophageal reflux disease with esophagitis 08/14/2015  . Lymphadenopathy of right cervical region 08/05/2015  . Lumbago with sciatica 11/07/2014  . Left thyroid nodule 02/01/2014  . Nonpalpable mass of neck 02/01/2014  . OSA (obstructive sleep apnea) 10/04/2013  . Lactose intolerance 05/31/2013  . IFG (impaired fasting glucose) 09/01/2012  . Osteoarthritis 07/10/2010  . Fibromyalgia 07/10/2010  . Tubular adenoma of colon 07/10/2010  . Anal fistula 07/10/2010  . Lumbar spondylosis 07/03/2010  . Lumbar radiculopathy 07/02/2010  . SLE (systemic lupus erythematosus) ([place]) 06/26/2010  . Essential hypertension, benign 06/26/2010  . Morbid obesity ([place]) 06/26/2010  . HYPERLIPIDEMIA 05/20/2010  . OSTEOPOROSIS 05/20/2010     Lanney Gins, PT,  DPT 03/08/17 1:50 PM   [facility] [address]  [place], [place], [address] Phone: [phone]   Fax:  [phone]  Name: Sharon Reilly MRN: [id number] Date of Birth: January 09, 1956

## 2017-03-09 ENCOUNTER — Ambulatory Visit (INDEPENDENT_AMBULATORY_CARE_PROVIDER_SITE_OTHER): Payer: Medicare HMO | Admitting: Sports Medicine

## 2017-03-09 DIAGNOSIS — J209 Acute bronchitis, unspecified: Secondary | ICD-10-CM | POA: Diagnosis not present

## 2017-03-09 MED ORDER — HYDROCOD POLST-CPM POLST ER 10-8 MG/5ML PO SUER: 5.0000 mL | Freq: Two times a day (BID) | ORAL | 0 refills | Status: DC | PRN

## 2017-03-09 MED ORDER — PREDNISONE 50 MG PO TABS: 50.0000 mg | ORAL_TABLET | Freq: Every day | ORAL | 0 refills | Status: DC

## 2017-03-09 NOTE — Progress Notes (Signed)
Subjective:    CC: Feeling sick  HPI: For the past several days this pleasant 62 year old female with a history of Sjogren's syndrome not currently on any immunosuppressants or biologic agents has had cough, bilateral ear tightness.  Cough is for the most part nonproductive, no shortness of breath, chest pain, no fevers or chills, no muscle aches or body aches.  Symptoms are mild, persistent.  I reviewed the past medical history, family history, social history, surgical history, and allergies today and no changes were needed.  Please see the problem list section below in epic for further details.  Past Medical History: Past Medical History:  Diagnosis Date  . Fibromyalgia   . Hyperlipidemia   . Hypertension   . Lupus   . Lupus (systemic lupus erythematosus) ([place])   . NSTEMI (non-ST elevated myocardial infarction) ([place]) 03/08/2016   [facility]  . Obesity   . Osteoarthritis   . Osteoporosis    Past Surgical History: Past Surgical History:  Procedure Laterality Date  . ABDOMINAL HYSTERECTOMY     partial  . BREAST SURGERY     reduction  . CARPAL TUNNEL RELEASE     left  . epicondylitis     left  . fistulotomy    . great toe fusion     left  . MRI syr ortho spec     Dr. Christen Butter  . REDUCTION MAMMAPLASTY    . right elbow    . surgisis biodesign fistula plug    . talonavicular joint fusion     LT foot   Social History: Social History   Socioeconomic History  . Marital status: Single    Spouse name: Not on file  . Number of children: Not on file  . Years of education: Not on file  . Highest education level: Not on file  Social Needs  . Financial resource strain: Not on file  . Food insecurity - worry: Not on file  . Food insecurity - inability: Not on file  . Transportation needs - medical: Not on file  . Transportation needs - non-medical: Not on file  Occupational History  . Not on file  Tobacco Use  . Smoking status: Never Smoker  . Smokeless tobacco: Never Used    Substance and Sexual Activity  . Alcohol use: No  . Drug use: No  . Sexual activity: No  Other Topics Concern  . Not on file  Social History Narrative  . Not on file   Family History: Family History  Problem Relation Age of Onset  . Breast cancer Mother        Diagnosed in her 35's.  [name] Hyperlipidemia Mother   . Hypertension Mother   . Hyperlipidemia Father   . Diabetes Brother   . Diabetes Unknown        aunt  . Kidney failure Brother   . Thyroid disease Sister    Allergies: Allergies  Allergen Reactions  . Amoxicillin Hives and Itching    No rash.   . Erythromycin Hives  . Sulfonamide Derivatives   . Doxycycline Hives  . Sulfa Antibiotics Hives and Rash   Medications: See med rec.  Review of Systems: No fevers, chills, night sweats, weight loss, chest pain, or shortness of breath.   Objective:    General: Well Developed, well nourished, and in no acute distress.  Neuro: Alert and oriented x3, extra-ocular muscles intact, sensation grossly intact.  HEENT: Normocephalic, atraumatic, pupils equal round reactive to light, neck supple, no masses, no lymphadenopathy,  thyroid nonpalpable.  Oropharynx, nasopharynx, ear canals unremarkable Skin: Warm and dry, no rashes. Cardiac: Regular rate and rhythm, no murmurs rubs or gallops, no lower extremity edema.  Respiratory: Clear to auscultation bilaterally. Not using accessory muscles, speaking in full sentences.  Impression and Recommendations:    Acute bronchitis with eustachian tube dysfunction Benign exam. Tussionex, prednisone. Return if no better in a week and we can consider antibiotics. ___________________________________________ Gwen Her. Dianah Field, M.D., ABFM., CAQSM. Primary Care and [place] Instructor of [place] of [facility] of Medicine

## 2017-03-09 NOTE — Patient Instructions (Signed)
Eustachian Tube Dysfunction The eustachian tube connects the middle ear to the back of the nose. It regulates air pressure in the middle ear by allowing air to move between the ear and nose. It also helps to drain fluid from the middle ear space. When the eustachian tube does not function properly, air pressure, fluid, or both can build up in the middle ear. Eustachian tube dysfunction can affect one or both ears. What are the causes? This condition happens when the eustachian tube becomes blocked or cannot open normally. This may result from:  Ear infections.  Colds and other upper respiratory infections.  Allergies.  Irritation, such as from cigarette smoke or acid from the stomach coming up into the esophagus (gastroesophageal reflux).  Sudden changes in air pressure, such as from descending in an airplane.  Abnormal growths in the nose or throat, such as nasal polyps, tumors, or enlarged tissue at the back of the throat (adenoids).  What increases the risk? This condition may be more likely to develop in people who smoke and people who are overweight. Eustachian tube dysfunction may also be more likely to develop in children, especially children who have:  Certain birth defects of the mouth, such as cleft palate.  Large tonsils and adenoids.  What are the signs or symptoms? Symptoms of this condition may include:  A feeling of fullness in the ear.  Ear pain.  Clicking or popping noises in the ear.  Ringing in the ear.  Hearing loss.  Loss of balance.  Symptoms may get worse when the air pressure around you changes, such as when you travel to an area of high elevation or fly on an airplane. How is this diagnosed? This condition may be diagnosed based on:  Your symptoms.  A physical exam of your ear, nose, and throat.  Tests, such as those that measure: ? The movement of your eardrum (tympanogram). ? Your hearing (audiometry).  How is this treated? Treatment  depends on the cause and severity of your condition. If your symptoms are mild, you may be able to relieve your symptoms by moving air into ("popping") your ears. If you have symptoms of fluid in your ears, treatment may include:  Decongestants.  Antihistamines.  Nasal sprays or ear drops that contain medicines that reduce swelling (steroids).  In some cases, you may need to have a procedure to drain the fluid in your eardrum (myringotomy). In this procedure, a small tube is placed in the eardrum to:  Drain the fluid.  Restore the air in the middle ear space.  Follow these instructions at home:  Take over-the-counter and prescription medicines only as told by your health care provider.  Use techniques to help pop your ears as recommended by your health care provider. These may include: ? Chewing gum. ? Yawning. ? Frequent, forceful swallowing. ? Closing your mouth, holding your nose closed, and gently blowing as if you are trying to blow air out of your nose.  Do not do any of the following until your health care provider approves: ? Travel to high altitudes. ? Fly in airplanes. ? Work in a pressurized cabin or room. ? Scuba dive.  Keep your ears dry. Dry your ears completely after showering or bathing.  Do not smoke.  Keep all follow-up visits as told by your health care provider. This is important. Contact a health care provider if:  Your symptoms do not go away after treatment.  Your symptoms come back after treatment.  You are   unable to pop your ears.  You have: ? A fever. ? Pain in your ear. ? Pain in your head or neck. ? Fluid draining from your ear.  Your hearing suddenly changes.  You become very dizzy.  You lose your balance. This information is not intended to replace advice given to you by your health care provider. Make sure you discuss any questions you have with your health care provider. Document Released: 03/01/2015 Document Revised: 07/11/2015  Document Reviewed: 02/21/2014 Elsevier Interactive Patient Education  2018 [employer]. Acute Bronchitis, Adult Acute bronchitis is sudden (acute) swelling of the air tubes (bronchi) in the lungs. Acute bronchitis causes these tubes to fill with mucus, which can make it hard to breathe. It can also cause coughing or wheezing. In adults, acute bronchitis usually goes away within 2 weeks. A cough caused by bronchitis may last up to 3 weeks. Smoking, allergies, and asthma can make the condition worse. Repeated episodes of bronchitis may cause further lung problems, such as chronic obstructive pulmonary disease (COPD). What are the causes? This condition can be caused by germs and by substances that irritate the lungs, including:  Cold and flu viruses. This condition is most often caused by the same virus that causes a cold.  Bacteria.  Exposure to tobacco smoke, dust, fumes, and air pollution.  What increases the risk? This condition is more likely to develop in people who:  Have close contact with someone with acute bronchitis.  Are exposed to lung irritants, such as tobacco smoke, dust, fumes, and vapors.  Have a weak immune system.  Have a respiratory condition such as asthma.  What are the signs or symptoms? Symptoms of this condition include:  A cough.  Coughing up clear, yellow, or green mucus.  Wheezing.  Chest congestion.  Shortness of breath.  A fever.  Body aches.  Chills.  A sore throat.  How is this diagnosed? This condition is usually diagnosed with a physical exam. During the exam, your health care provider may order tests, such as chest X-rays, to rule out other conditions. He or she may also:  Test a sample of your mucus for bacterial infection.  Check the level of oxygen in your blood. This is done to check for pneumonia.  Do a chest X-ray or lung function testing to rule out pneumonia and other conditions.  Perform blood tests.  Your health  care provider will also ask about your symptoms and medical history. How is this treated? Most cases of acute bronchitis clear up over time without treatment. Your health care provider may recommend:  Drinking more fluids. Drinking more makes your mucus thinner, which may make it easier to breathe.  Taking a medicine for a fever or cough.  Taking an antibiotic medicine.  Using an inhaler to help improve shortness of breath and to control a cough.  Using a cool mist vaporizer or humidifier to make it easier to breathe.  Follow these instructions at home: Medicines  Take over-the-counter and prescription medicines only as told by your health care provider.  If you were prescribed an antibiotic, take it as told by your health care provider. Do not stop taking the antibiotic even if you start to feel better. General instructions  Get plenty of rest.  Drink enough fluids to keep your urine clear or pale yellow.  Avoid smoking and secondhand smoke. Exposure to cigarette smoke or irritating chemicals will make bronchitis worse. If you smoke and you need help quitting, ask your health  care provider. Quitting smoking will help your lungs heal faster.  Use an inhaler, cool mist vaporizer, or humidifier as told by your health care provider.  Keep all follow-up visits as told by your health care provider. This is important. How is this prevented? To lower your risk of getting this condition again:  Wash your hands often with soap and water. If soap and water are not available, use hand sanitizer.  Avoid contact with people who have cold symptoms.  Try not to touch your hands to your mouth, nose, or eyes.  Make sure to get the flu shot every year.  Contact a health care provider if:  Your symptoms do not improve in 2 weeks of treatment. Get help right away if:  You cough up blood.  You have chest pain.  You have severe shortness of breath.  You become dehydrated.  You faint  or keep feeling like you are going to faint.  You keep vomiting.  You have a severe headache.  Your fever or chills gets worse. This information is not intended to replace advice given to you by your health care provider. Make sure you discuss any questions you have with your health care provider. Document Released: 03/12/2004 Document Revised: 08/28/2015 Document Reviewed: 07/24/2015 Elsevier Interactive Patient Education  [employer].

## 2017-03-09 NOTE — Assessment & Plan Note (Signed)
Benign exam. Tussionex, prednisone. Return if no better in a week and we can consider antibiotics.

## 2017-03-15 ENCOUNTER — Ambulatory Visit: Payer: Medicare HMO | Admitting: Physical Therapy

## 2017-03-15 ENCOUNTER — Encounter: Payer: Self-pay | Admitting: Physical Therapy

## 2017-03-15 DIAGNOSIS — R29898 Other symptoms and signs involving the musculoskeletal system: Secondary | ICD-10-CM | POA: Diagnosis not present

## 2017-03-15 DIAGNOSIS — R262 Difficulty in walking, not elsewhere classified: Secondary | ICD-10-CM

## 2017-03-15 DIAGNOSIS — G8929 Other chronic pain: Secondary | ICD-10-CM | POA: Diagnosis not present

## 2017-03-15 DIAGNOSIS — M25561 Pain in right knee: Secondary | ICD-10-CM | POA: Diagnosis not present

## 2017-03-15 DIAGNOSIS — M25562 Pain in left knee: Secondary | ICD-10-CM

## 2017-03-15 NOTE — Therapy (Addendum)
[facility] [address]  [place], [place], [address] Phone: [phone]   Fax:  [phone]  Physical Therapy Treatment  Patient Details  Name: Sharon Reilly MRN: [id number] Date of Birth: [birth year] Referring Provider: Dr. Beatrice Lecher   Encounter Date: 03/15/2017  PT End of Session - 03/15/17 1316    Visit Number  4    Number of Visits  12    Date for PT Re-Evaluation  03/31/17    Authorization Type  Medicare and Medicaid    Authorization Time Period  02/24/17 - 03/26/17    Authorization - Visit Number  3    Authorization - Number of Visits  3    PT Start Time  2409    PT Stop Time  1351    PT Time Calculation (min)  39 min    Activity Tolerance  Patient tolerated treatment well    Behavior During Therapy  [facility] for tasks assessed/performed       Past Medical History:  Diagnosis Date  . Fibromyalgia   . Hyperlipidemia   . Hypertension   . Lupus   . Lupus (systemic lupus erythematosus) ([place])   . NSTEMI (non-ST elevated myocardial infarction) ([place]) 03/08/2016   [facility]  . Obesity   . Osteoarthritis   . Osteoporosis     Past Surgical History:  Procedure Laterality Date  . ABDOMINAL HYSTERECTOMY     partial  . BREAST SURGERY     reduction  . CARPAL TUNNEL RELEASE     left  . epicondylitis     left  . fistulotomy    . great toe fusion     left  . MRI syr ortho spec     Dr. Christen Butter  . REDUCTION MAMMAPLASTY    . right elbow    . surgisis biodesign fistula plug    . talonavicular joint fusion     LT foot    There were no vitals filed for this visit.  Subjective Assessment - 03/15/17 1314    Subjective  Feels like L knee has been bothering her since Saturday    Pertinent History  HTN, thyroid dysfunction, fibromyalgia    Diagnostic tests  xrays - B knees - arthritis per patient    Patient Stated Goals  improve pain and walking    Currently in Pain?  Yes    Pain Score  4  was up to 6/10  this morning    Pain Location  Knee    Pain Orientation  Right;Left    Pain Descriptors / Indicators  Aching;Sore    Pain Type  Chronic pain                      [facility] Adult PT Treatment/Exercise - 03/15/17 0001      Knee/Hip Exercises: Aerobic   Nustep  L5 x 6 min      Knee/Hip Exercises: Machines for Strengthening   Cybex Knee Extension  10# B LE 2 x 10      Knee/Hip Exercises: Standing   Heel Raises  Both;10 reps    Heel Raises Limitations  heel + toe raise     Hip Flexion  Stengthening;Right;Left;10 reps;Knee straight red looped tband    Hip Abduction  Stengthening;Both;10 reps;Knee straight red looped tband    Hip Extension  Stengthening;Both;10 reps;Knee straight red looped tband    Step Down  Right;Left;10 reps;Hand Hold: 2;Step Height: 4"    Wall  Squat  10 reps very shallow      Knee/Hip Exercises: Seated   Long Arc Quad  Strengthening;Both;10 reps + ball squeeze    Sit to Sand  10 reps;with UE support      Modalities   Modalities  Iontophoresis      Iontophoresis   Type of Iontophoresis  Dexamethasone    Location  B anterior knee    Dose  1.0 mL    Time  80 mA; 4-6 hours                  PT Long Term Goals - 03/15/17 1353      PT LONG TERM GOAL #1   Title  patient to be independent with advanced HEP    Status  Achieved      PT LONG TERM GOAL #2   Title  patient to improve B LE strength to >/= 4+/5 for improved mobility    Status  Partially Met      PT LONG TERM GOAL #3   Title  patient to report initiation and maitenance of walking program for >/= 3 days/week    Status  Achieved      PT LONG TERM GOAL #4   Title  patient to report pain no greater than 2/10 with walking    Status  Not Met      PT LONG TERM GOAL #5   Title  patient to report ability to perform ADLs and household chores wihtout pain limiting    Status  Partially Met            Plan - 03/15/17 1316    Clinical Impression Statement  Sahmya today  reporting she has been walking daily along with trying to take stairs more frequently with improved tolerance. Patient wishing to go on 30 day hold at this time and to try HEP independently at home. Some goals met, however, very limited time frame not allowing for meeting of all goals. Patient understanding of need to remain active and self-progress for improved functional mobility. Did apply ionto patch to B knees today for hopeful improved pain.    PT Treatment/Interventions  ADLs/Self Care Home Management;Cryotherapy;Electrical Stimulation;Iontophoresis 1m/ml Dexamethasone;Moist Heat;Therapeutic exercise;Therapeutic activities;Functional mobility training;Stair training;Gait training;DME Instruction;Balance training;Neuromuscular re-education;Patient/family education;Manual techniques;Vasopneumatic Device;Taping;Dry needling;Passive range of motion    Consulted and Agree with Plan of Care  Patient       Patient will benefit from skilled therapeutic intervention in order to improve the following deficits and impairments:  Abnormal gait, Decreased activity tolerance, Decreased mobility, Difficulty walking, Pain, Decreased strength  Visit Diagnosis: Chronic pain of right knee  Chronic pain of left knee  Other symptoms and signs involving the musculoskeletal system  Difficulty in walking, not elsewhere classified     Problem List Patient Active Problem List   Diagnosis Date Noted  . Acute bronchitis with eustachian tube dysfunction 03/09/2017  . Hallux rigidus of right foot 09/29/2016  . Plantar fasciitis of right foot 09/29/2016  . Bilateral knee pain 09/29/2016  . CAD (coronary artery disease) 03/09/2016  . NSTEMI (non-ST elevated myocardial infarction) ([place] 03/08/2016  . Internal hemorrhoids 10/14/2015  . Gastroesophageal reflux disease with esophagitis 08/14/2015  . Lymphadenopathy of right cervical region 08/05/2015  . Lumbago with sciatica 11/07/2014  . Left thyroid nodule  02/01/2014  . Nonpalpable mass of neck 02/01/2014  . OSA (obstructive sleep apnea) 10/04/2013  . Lactose intolerance 05/31/2013  . IFG (impaired fasting glucose) 09/01/2012  . Fibromyalgia 07/10/2010  .  Tubular adenoma of colon 07/10/2010  . Anal fistula 07/10/2010  . Sjogren's syndrome ([place]) 06/26/2010  . Essential hypertension, benign 06/26/2010  . Morbid obesity ([place]) 06/26/2010  . HYPERLIPIDEMIA 05/20/2010  . OSTEOPOROSIS 05/20/2010     Lanney Gins, PT, DPT 03/15/17 1:56 PM  PHYSICAL THERAPY DISCHARGE SUMMARY  Visits from Start of Care: 4  Current functional level related to goals / functional outcomes: See above   Remaining deficits: See above   Education / Equipment: HEP  Plan: Patient agrees to discharge.  Patient goals were partially met. Patient is being discharged due to being pleased with the current functional level.  ?????     Lanney Gins, PT, DPT 04/20/17 9:45 AM  [facility] [address]  [place], [place], [address] Phone: [phone]   Fax:  [phone]  Name: GARRIE ELENES MRN: [id number] Date of Birth: [birth year]

## 2017-03-15 NOTE — Patient Instructions (Signed)
1. Heel + toe raise x 10 reps  2. Kick out to side x 10 reps  3. Kick back x 10 reps  4. Kick forward x 10 reps  5. Sit to stand  6. Seated kickouts   Wall Squat    Feet shoulder width apart, ____ inches in front of wall, lean against wall. Heels on floor, knees parallel, bend hips and knees to almost 90. Hold __3__ seconds. Lower slowly, explode on return. Repeat __10-15__ times.   Knee Extension: Step-Down Forward / Sideways / Backward (Eccentric)   Stand, holding support, affected foot on step. Slowly bend affected knee for 3-5 seconds and bring other heel forward to floor. Quickly straighten affected leg. Repeat, placing foot flat to side. Repeat, touching toe behind. __10-15_ reps per set.     IONTOPHORESIS PATIENT PRECAUTIONS & CONTRAINDICATIONS:  . Redness under one or both electrodes can occur.  This characterized by a uniform redness that usually disappears within 12 hours of treatment. . Small pinhead size blisters may result in response to the drug.  Contact your physician if the problem persists more than 24 hours. . On rare occasions, iontophoresis therapy can result in temporary skin reactions such as rash, inflammation, irritation or burns.  The skin reactions may be the result of individual sensitivity to the ionic solution used, the condition of the skin at the start of treatment, reaction to the materials in the electrodes, allergies or sensitivity to dexamethasone, or a poor connection between the patch and your skin.  Discontinue using iontophoresis if you have any of these reactions and report to your therapist. . Remove the Patch or electrodes if you have any undue sensation of pain or burning during the treatment and report discomfort to your therapist. . Tell your Therapist if you have had known adverse reactions to the application of electrical current. . If using the Patch, the LED light will turn off when treatment is complete and the patch can be  removed.  Approximate treatment time is 1-3 hours.  Remove the patch when light goes off or after 6 hours. . The Patch can be worn during normal activity, however excessive motion where the electrodes have been placed can cause poor contact between the skin and the electrode or uneven electrical current resulting in greater risk of skin irritation. [name] Keep out of the reach of children.   . DO NOT use if you have a cardiac pacemaker or any other electrically sensitive implanted device. . DO NOT use if you have a known sensitivity to dexamethasone. . DO NOT use during Magnetic Resonance Imaging (MRI). . DO NOT use over broken or compromised skin (e.g. sunburn, cuts, or acne) due to the increased risk of skin reaction. . DO NOT SHAVE over the area to be treated:  To establish good contact between the Patch and the skin, excessive hair may be clipped. . DO NOT place the Patch or electrodes on or over your eyes, directly over your heart, or brain. . DO NOT reuse the Patch or electrodes as this may cause burns to occur.

## 2017-03-16 DIAGNOSIS — E0590 Thyrotoxicosis, unspecified without thyrotoxic crisis or storm: Secondary | ICD-10-CM | POA: Diagnosis not present

## 2017-03-23 ENCOUNTER — Ambulatory Visit: Payer: Medicare HMO | Admitting: Family Medicine

## 2017-03-25 DIAGNOSIS — E668 Other obesity: Secondary | ICD-10-CM | POA: Diagnosis not present

## 2017-03-25 DIAGNOSIS — Z6841 Body Mass Index (BMI) 40.0 and over, adult: Secondary | ICD-10-CM | POA: Diagnosis not present

## 2017-03-25 DIAGNOSIS — M069 Rheumatoid arthritis, unspecified: Secondary | ICD-10-CM | POA: Diagnosis not present

## 2017-03-25 DIAGNOSIS — Z881 Allergy status to other antibiotic agents status: Secondary | ICD-10-CM | POA: Diagnosis not present

## 2017-03-25 DIAGNOSIS — Z79899 Other long term (current) drug therapy: Secondary | ICD-10-CM | POA: Diagnosis not present

## 2017-03-25 DIAGNOSIS — Z7982 Long term (current) use of aspirin: Secondary | ICD-10-CM | POA: Diagnosis not present

## 2017-03-25 DIAGNOSIS — Z882 Allergy status to sulfonamides status: Secondary | ICD-10-CM | POA: Diagnosis not present

## 2017-03-25 DIAGNOSIS — J101 Influenza due to other identified influenza virus with other respiratory manifestations: Secondary | ICD-10-CM | POA: Diagnosis not present

## 2017-03-25 DIAGNOSIS — J09X2 Influenza due to identified novel influenza A virus with other respiratory manifestations: Secondary | ICD-10-CM | POA: Diagnosis not present

## 2017-03-25 DIAGNOSIS — R05 Cough: Secondary | ICD-10-CM | POA: Diagnosis not present

## 2017-03-26 ENCOUNTER — Other Ambulatory Visit: Payer: Self-pay | Admitting: Family Medicine

## 2017-03-29 ENCOUNTER — Telehealth: Payer: Self-pay | Admitting: *Deleted

## 2017-03-29 NOTE — Telephone Encounter (Signed)
Called and informed pt that her forms have been completed and will be up front for her to p/u.  Forms copied,scanned .Elouise Munroe, [place]

## 2017-03-31 ENCOUNTER — Telehealth: Payer: Self-pay

## 2017-03-31 MED ORDER — AZITHROMYCIN 250 MG PO TABS: ORAL_TABLET | ORAL | 0 refills | Status: DC

## 2017-03-31 MED ORDER — BENZONATATE 200 MG PO CAPS: 200.0000 mg | ORAL_CAPSULE | Freq: Three times a day (TID) | ORAL | 0 refills | Status: DC | PRN

## 2017-03-31 NOTE — Telephone Encounter (Signed)
Sharon Reilly called and states she still has the cough and would like cough medication. Please advise.

## 2017-03-31 NOTE — Telephone Encounter (Signed)
I am going to add high-doseTessalon Perles as well as a course of azithromycin considering how long this has been going on.

## 2017-03-31 NOTE — Telephone Encounter (Signed)
Patient advised.

## 2017-04-08 ENCOUNTER — Ambulatory Visit (INDEPENDENT_AMBULATORY_CARE_PROVIDER_SITE_OTHER): Payer: Medicare HMO | Admitting: Family Medicine

## 2017-04-08 ENCOUNTER — Encounter: Payer: Self-pay | Admitting: Family Medicine

## 2017-04-08 VITALS — BP 122/64 | HR 58 | Temp 97.9°F | Ht 61.0 in | Wt 243.0 lb

## 2017-04-08 DIAGNOSIS — N1831 Chronic kidney disease, stage 3a: Secondary | ICD-10-CM | POA: Insufficient documentation

## 2017-04-08 DIAGNOSIS — J101 Influenza due to other identified influenza virus with other respiratory manifestations: Secondary | ICD-10-CM | POA: Diagnosis not present

## 2017-04-08 DIAGNOSIS — N183 Chronic kidney disease, stage 3 unspecified: Secondary | ICD-10-CM

## 2017-04-08 DIAGNOSIS — R05 Cough: Secondary | ICD-10-CM | POA: Diagnosis not present

## 2017-04-08 DIAGNOSIS — R058 Other specified cough: Secondary | ICD-10-CM

## 2017-04-08 NOTE — Progress Notes (Signed)
Subjective:    Patient ID: Sharon Reilly, female    DOB: [birth year], 62 y.o.   MRN: [id number]  HPI Sharon Reilly is here today for follow-up of recent hospital visit.  She is a 62 year old female who was seen in the emergency department on February 7 and diagnosed with influenza A.  She presented with 3 days of upper respiratory symptoms and nausea and vomiting and diarrhea.  She was given Tamiflu as well as some IV fluids as well as some Zofran for nausea.  She reports that she still has some residual cough and is still using cough drops.  Did have a normal chest x-ray. She is feeling about 85% better overall.  She is using the tessalon pearles.     Review of Systems  BP 122/64   Pulse (!) 58   Temp 97.9 F (36.6 C)   Ht 5\' 1"  (1.549 m)   Wt 243 lb (110.2 kg)   SpO2 98%   BMI 45.91 kg/m     Allergies  Allergen Reactions  . Amoxicillin Hives and Itching    No rash.   . Erythromycin Hives  . Sulfonamide Derivatives   . Doxycycline Hives  . Sulfa Antibiotics Hives and Rash    Past Medical History:  Diagnosis Date  . Fibromyalgia   . Hyperlipidemia   . Hypertension   . Lupus   . Lupus (systemic lupus erythematosus) ([place])   . NSTEMI (non-ST elevated myocardial infarction) ([place]) 03/08/2016   [facility]  . Obesity   . Osteoarthritis   . Osteoporosis     Past Surgical History:  Procedure Laterality Date  . ABDOMINAL HYSTERECTOMY     partial  . BREAST SURGERY     reduction  . CARPAL TUNNEL RELEASE     left  . epicondylitis     left  . fistulotomy    . great toe fusion     left  . MRI syr ortho spec     Dr. Christen Butter  . REDUCTION MAMMAPLASTY    . right elbow    . surgisis biodesign fistula plug    . talonavicular joint fusion     LT foot    Social History   Socioeconomic History  . Marital status: Single    Spouse name: Not on file  . Number of children: Not on file  . Years of education: Not on file  . Highest education level: Not on file  Social Needs  .  Financial resource strain: Not on file  . Food insecurity - worry: Not on file  . Food insecurity - inability: Not on file  . Transportation needs - medical: Not on file  . Transportation needs - non-medical: Not on file  Occupational History  . Not on file  Tobacco Use  . Smoking status: Never Smoker  . Smokeless tobacco: Never Used  Substance and Sexual Activity  . Alcohol use: No  . Drug use: No  . Sexual activity: No  Other Topics Concern  . Not on file  Social History Narrative  . Not on file    Family History  Problem Relation Age of Onset  . Breast cancer Mother        Diagnosed in her 70's.  [name] Hyperlipidemia Mother   . Hypertension Mother   . Hyperlipidemia Father   . Diabetes Brother   . Diabetes Unknown        aunt  . Kidney failure Brother   . Thyroid disease Sister  Outpatient Encounter Medications as of 04/08/2017  Medication Sig  . acetaminophen (TYLENOL) 325 MG tablet Take 650 mg by mouth.  . AMBULATORY NON FORMULARY MEDICATION Medication Name: Call Huey Romans to get a download off her CPAP and fax to our office.  [phone]  . aspirin 81 MG tablet Take 81 mg by mouth daily.    . benzonatate (TESSALON) 200 MG capsule Take 1 capsule (200 mg total) by mouth 3 (three) times daily as needed for cough.  . fexofenadine (ALLEGRA) 180 MG tablet Take 1 tablet (180 mg total) by mouth daily.  . fluticasone (VERAMYST) 27.5 MCG/SPRAY nasal spray Place 2 sprays into the nose 3 (three) times daily as needed for rhinitis.  [name] losartan-hydrochlorothiazide (HYZAAR) 100-25 MG tablet take 1 tablet by mouth once daily  . metoprolol tartrate (LOPRESSOR) 100 MG tablet TAKE ONE TABLET BY MOUTH TWICE DAILY  . omeprazole (PRILOSEC) 40 MG capsule Take 1 capsule (40 mg total) by mouth 2 (two) times daily.  . rosuvastatin (CRESTOR) 10 MG tablet Take 10 mg by mouth daily.  [name] terbinafine (LAMISIL) 250 MG tablet Take 1 tablet (250 mg total) by mouth daily.  . Vitamin D, Ergocalciferol,  (DRISDOL) 50000 units CAPS capsule   . [DISCONTINUED] AMBULATORY NON FORMULARY MEDICATION Medication Name: roll about walker with seat and basket. Dx. M54.42  Lumbago with sciatica  . [DISCONTINUED] azithromycin (ZITHROMAX Z-PAK) 250 MG tablet Take 2 tablets (500 mg) on  Day 1,  followed by 1 tablet (250 mg) once daily on Days 2 through 5.  . [DISCONTINUED] diclofenac sodium (VOLTAREN) 1 % GEL Apply 2 g topically 4 (four) times daily. To her knees.  . [DISCONTINUED] gabapentin (NEURONTIN) 100 MG capsule 1 tab po QHS x 1 week, then OK to increase to BID   No facility-administered encounter medications on file as of 04/08/2017.           Objective:   Physical Exam  Constitutional: She is oriented to person, place, and time. She appears well-developed and well-nourished.  HENT:  Head: Normocephalic and atraumatic.  Right Ear: External ear normal.  Left Ear: External ear normal.  Nose: Nose normal.  Mouth/Throat: Oropharynx is clear and moist.  TMs and canals are clear.   Eyes: Conjunctivae and EOM are normal. Pupils are equal, round, and reactive to light.  Neck: Neck supple. No thyromegaly present.  Cardiovascular: Normal rate, regular rhythm and normal heart sounds.  Pulmonary/Chest: Effort normal and breath sounds normal. She has no wheezes.  Lymphadenopathy:    She has no cervical adenopathy.  Neurological: She is alert and oriented to person, place, and time.  Skin: Skin is warm and dry.  Psychiatric: She has a normal mood and affect.          Assessment & Plan:  Flu A - much better but cough still present.  All significantly better.  Lungs are clear on exam today which is very reassuring.  CKD 3 - Has F/U with Nephrology.  She really needs to have her kidney function rechecked as she did have a bump in her creatinine when she was seen for the flu to the emergency department.  She was dehydrated and did receive IV fluids.  She says she has a follow-up with nephrology in  about 2 weeks and will have blood work done then so think that is perfectly fine to wait until then to recheck her creatinine.  Post infectious cough -   Tessalon Perles, run humidifier, hydration, drink plenty of water.

## 2017-04-15 ENCOUNTER — Encounter: Payer: Self-pay | Admitting: Physician Assistant

## 2017-04-15 ENCOUNTER — Ambulatory Visit (INDEPENDENT_AMBULATORY_CARE_PROVIDER_SITE_OTHER): Payer: Medicare HMO | Admitting: Physician Assistant

## 2017-04-15 VITALS — BP 126/72 | HR 59 | Temp 97.9°F | Wt 243.0 lb

## 2017-04-15 DIAGNOSIS — R05 Cough: Secondary | ICD-10-CM | POA: Diagnosis not present

## 2017-04-15 DIAGNOSIS — H65191 Other acute nonsuppurative otitis media, right ear: Secondary | ICD-10-CM | POA: Diagnosis not present

## 2017-04-15 DIAGNOSIS — R058 Other specified cough: Secondary | ICD-10-CM | POA: Insufficient documentation

## 2017-04-15 MED ORDER — BENZONATATE 200 MG PO CAPS: 200.0000 mg | ORAL_CAPSULE | Freq: Three times a day (TID) | ORAL | 0 refills | Status: DC | PRN

## 2017-04-15 MED ORDER — GUAIFENESIN-CODEINE 200-10 MG/5ML PO LIQD: 10.0000 mL | ORAL | 0 refills | Status: DC | PRN

## 2017-04-15 NOTE — Progress Notes (Signed)
HPI:                                                                Sharon Reilly is a 62 y.o. female who presents to [place]: [facility] today for otalgia and cough  Pleasant 62 yo F with PMH of Sjogren's syndrome, fibromyalgia, HTN presents with persistent b/l ear fullness and cough for about 5 weeks. She is also having intermittent b/l otalgia. Cough is persistent, non-productive. No chest pain, dyspnea or hemoptysis. Denies hearing loss, otorrhea, fever, or severe pain. She was first seen in our office on 03/09/17 and treated for acute bronchitis with esutachian tube dysfunction with Tussionex and Prednisone. She was diagnosed with influenza A on 03/25/17 in the ED and treated with Tamilfu. This was followed by a course of Azithromycin on 03/31/17 when symptoms persisted. She saw her PCP 1 week ago for cough and was treated for post-infectious cough with tessalon and symptomatic care. She is currently taking Tessalon and using Flonase.   Depression screen [facility] 2/9 04/08/2017 09/21/2016 03/23/2016  Decreased Interest 0 1 -  Down, Depressed, Hopeless 0 0 0  PHQ - 2 Score 0 1 0  Altered sleeping 1 - -  Tired, decreased energy 1 - -  Change in appetite 0 - -  Feeling bad or failure about yourself  0 - -  Trouble concentrating 0 - -  Moving slowly or fidgety/restless 0 - -  Suicidal thoughts 0 - -  PHQ-9 Score 2 - -  Difficult doing work/chores Not difficult at all - -    GAD 7 : Generalized Anxiety Score 04/08/2017  Nervous, Anxious, on Edge 0  Control/stop worrying 0  Worry too much - different things 0  Trouble relaxing 0  Restless 0  Easily annoyed or irritable 0  Afraid - awful might happen 0  Total GAD 7 Score 0  Anxiety Difficulty Not difficult at all      Past Medical History:  Diagnosis Date  . Fibromyalgia   . Hyperlipidemia   . Hypertension   . Lupus   . Lupus (systemic lupus erythematosus) ([place])   . NSTEMI (non-ST elevated  myocardial infarction) ([place]) 03/08/2016   [facility]  . Obesity   . Osteoarthritis   . Osteoporosis    Past Surgical History:  Procedure Laterality Date  . ABDOMINAL HYSTERECTOMY     partial  . BREAST SURGERY     reduction  . CARPAL TUNNEL RELEASE     left  . epicondylitis     left  . fistulotomy    . great toe fusion     left  . MRI syr ortho spec     Dr. Christen Butter  . REDUCTION MAMMAPLASTY    . right elbow    . surgisis biodesign fistula plug    . talonavicular joint fusion     LT foot   Social History   Tobacco Use  . Smoking status: Never Smoker  . Smokeless tobacco: Never Used  Substance Use Topics  . Alcohol use: No   family history includes Breast cancer in her mother; Diabetes in her brother and unknown relative; Hyperlipidemia in her father and mother; Hypertension in her mother; Kidney failure in her brother; Thyroid disease in  her sister.    ROS: negative except as noted in the HPI  Medications: Current Outpatient Medications  Medication Sig Dispense Refill  . acetaminophen (TYLENOL) 325 MG tablet Take 650 mg by mouth.    . AMBULATORY NON FORMULARY MEDICATION Medication Name: Call Huey Romans to get a download off her CPAP and fax to our office.  [phone] 1 Units 0  . aspirin 81 MG tablet Take 81 mg by mouth daily.      . benzonatate (TESSALON) 200 MG capsule Take 1 capsule (200 mg total) by mouth 3 (three) times daily as needed for cough. 45 capsule 0  . fexofenadine (ALLEGRA) 180 MG tablet Take 1 tablet (180 mg total) by mouth daily. 30 tablet 6  . fluticasone (VERAMYST) 27.5 MCG/SPRAY nasal spray Place 2 sprays into the nose 3 (three) times daily as needed for rhinitis.    [name] losartan-hydrochlorothiazide (HYZAAR) 100-25 MG tablet take 1 tablet by mouth once daily 90 tablet 1  . metoprolol tartrate (LOPRESSOR) 100 MG tablet TAKE ONE TABLET BY MOUTH TWICE DAILY 180 tablet 1  . omeprazole (PRILOSEC) 40 MG capsule Take 1 capsule (40 mg total) by mouth 2 (two) times  daily. 180 capsule 0  . rosuvastatin (CRESTOR) 10 MG tablet Take 10 mg by mouth daily.    [name] terbinafine (LAMISIL) 250 MG tablet Take 1 tablet (250 mg total) by mouth daily. 30 tablet 5  . Vitamin D, Ergocalciferol, (DRISDOL) 50000 units CAPS capsule      No current facility-administered medications for this visit.    Allergies  Allergen Reactions  . Amoxicillin Hives and Itching    No rash.   . Erythromycin Hives  . Sulfonamide Derivatives   . Doxycycline Hives  . Sulfa Antibiotics Hives and Rash       Objective:  BP 126/72   Pulse (!) 59   Temp 97.9 F (36.6 C) (Oral)   Wt 243 lb (110.2 kg)   SpO2 96%   BMI 45.91 kg/m  Gen:  alert, not ill-appearing, no distress, appropriate for age 20: head normocephalic without obvious abnormality, conjunctiva and cornea clear, wearing glasses, left TM clear with bubbles, right TM with transudative effusion, gross hearing intact bilaterally, oropharynx clear, moist mucous membranes, neck supple, no cervical adenopathy, trachea midline Pulm: Normal work of breathing, normal phonation, clear to auscultation bilaterally, no wheezes, rales or rhonchi CV: Normal rate, regular rhythm, s1 and s2 distinct, no murmurs, clicks or rubs  Neuro: alert and oriented x 3, no tremor MSK: extremities atraumatic, normal gait and station Skin: intact, no rashes on exposed skin, no jaundice, no cyanosis   No results found for this or any previous visit (from the past 72 hour(s)). No results found.    Assessment and Plan: 62 y.o. female with   1. Acute middle ear effusion, right - no hearing loss or otitis media. Continue Flonase and active surveillance - consider tympanostomy if persists after 2 months  2. Post-viral cough syndrome - vital signs stable, SpO2 96% on RA at rest, there may be some contributory obesity- hypoventilation syndrome, lungs clear to auscultation, no second sickening or symptoms of pneumonia. Reassurance that this will  resolve with the tincture of time. Continue symptomatic care - benzonatate (TESSALON) 200 MG capsule; Take 1 capsule (200 mg total) by mouth 3 (three) times daily as needed for cough.  Dispense: 45 capsule; Refill: 0 - guaiFENesin-Codeine 200-10 MG/5ML LIQD; Take 10 mLs by mouth every 4 (four) hours as needed.  Dispense: 473 mL;  Refill: 0   Patient education and anticipatory guidance given Patient agrees with treatment plan Follow-up as needed if symptoms worsen or fail to improve  Darlyne Russian PA-C

## 2017-04-15 NOTE — Patient Instructions (Signed)
-   continue your Flonase 1 spray each nostril daily to help drain your ear  Earache, Adult An earache, or ear pain, can be caused by many things, including:  An infection.  Ear wax buildup.  Ear pressure.  Something in the ear that should not be there (foreign body).  A sore throat.  Tooth problems.  Jaw problems.  Treatment of the earache will depend on the cause. If the cause is not clear or cannot be determined, you may need to watch your symptoms until your earache goes away or until a cause is found. Follow these instructions at home: Pay attention to any changes in your symptoms. Take these actions to help with your pain:  Take or apply over-the-counter and prescription medicines only as told by your health care provider.  If you were prescribed an antibiotic medicine, use it as told by your health care provider. Do not stop using the antibiotic even if you start to feel better.  Do not put anything in your ear other than medicine that is prescribed by your health care provider.  If directed, apply heat to the affected area as often as told by your health care provider. Use the heat source that your health care provider recommends, such as a moist heat pack or a heating pad. ? Place a towel between your skin and the heat source. ? Leave the heat on for 20-30 minutes. ? Remove the heat if your skin turns bright red. This is especially important if you are unable to feel pain, heat, or cold. You may have a greater risk of getting burned.  If directed, put ice on the ear: ? Put ice in a plastic bag. ? Place a towel between your skin and the bag. ? Leave the ice on for 20 minutes, 2-3 times a day.  Try resting in an upright position instead of lying down. This may help to reduce pressure in your ear and relieve pain.  Chew gum if it helps to relieve your ear pain.  Treat any allergies as told by your health care provider.  Keep all follow-up visits as told by your  health care provider. This is important.  Contact a health care provider if:  Your pain does not improve within 2 days.  Your earache gets worse.  You have new symptoms.  You have a fever. Get help right away if:  You have a severe headache.  You have a stiff neck.  You have trouble swallowing.  You have redness or swelling behind your ear.  You have fluid or blood coming from your ear.  You have hearing loss.  You feel dizzy. This information is not intended to replace advice given to you by your health care provider. Make sure you discuss any questions you have with your health care provider. Document Released: 09/20/2003 Document Revised: 10/01/2015 Document Reviewed: 07/29/2015 Elsevier Interactive Patient Education  [employer].

## 2017-04-16 ENCOUNTER — Telehealth: Payer: Self-pay

## 2017-04-16 DIAGNOSIS — R05 Cough: Secondary | ICD-10-CM

## 2017-04-16 DIAGNOSIS — R058 Other specified cough: Secondary | ICD-10-CM

## 2017-04-16 MED ORDER — GUAIFENESIN-CODEINE 100-10 MG/5ML PO SYRP: 10.0000 mL | ORAL_SOLUTION | Freq: Three times a day (TID) | ORAL | 0 refills | Status: DC | PRN

## 2017-04-16 NOTE — Telephone Encounter (Signed)
New Rx sent.

## 2017-04-16 NOTE — Telephone Encounter (Signed)
Pharmacy called reporting that they do not have guaifenesin-codeine in 200-10 mg but they do have it in 100-10 mg. Please advise. -EH/RMA

## 2017-04-19 DIAGNOSIS — N183 Chronic kidney disease, stage 3 (moderate): Secondary | ICD-10-CM | POA: Diagnosis not present

## 2017-04-19 DIAGNOSIS — D631 Anemia in chronic kidney disease: Secondary | ICD-10-CM | POA: Diagnosis not present

## 2017-04-19 DIAGNOSIS — I10 Essential (primary) hypertension: Secondary | ICD-10-CM | POA: Diagnosis not present

## 2017-04-19 DIAGNOSIS — N2581 Secondary hyperparathyroidism of renal origin: Secondary | ICD-10-CM | POA: Diagnosis not present

## 2017-04-22 ENCOUNTER — Other Ambulatory Visit: Payer: Self-pay | Admitting: Family Medicine

## 2017-05-17 ENCOUNTER — Other Ambulatory Visit: Payer: Self-pay | Admitting: Family Medicine

## 2017-05-20 ENCOUNTER — Telehealth: Payer: Self-pay | Admitting: Family Medicine

## 2017-05-20 NOTE — Telephone Encounter (Signed)
Approvedtoday  PA Case: [id number], Status: Approved, Coverage Starts on: 05/20/2017 12:00:00 AM, Coverage Ends on: 05/20/2018 12:00:00 AM. Questions? Contact [phone].

## 2017-06-10 DIAGNOSIS — I10 Essential (primary) hypertension: Secondary | ICD-10-CM | POA: Diagnosis not present

## 2017-06-10 DIAGNOSIS — N2581 Secondary hyperparathyroidism of renal origin: Secondary | ICD-10-CM | POA: Diagnosis not present

## 2017-06-10 DIAGNOSIS — E0590 Thyrotoxicosis, unspecified without thyrotoxic crisis or storm: Secondary | ICD-10-CM | POA: Diagnosis not present

## 2017-06-14 DIAGNOSIS — M797 Fibromyalgia: Secondary | ICD-10-CM | POA: Diagnosis not present

## 2017-06-14 DIAGNOSIS — M150 Primary generalized (osteo)arthritis: Secondary | ICD-10-CM | POA: Diagnosis not present

## 2017-06-21 ENCOUNTER — Other Ambulatory Visit: Payer: Self-pay | Admitting: Family Medicine

## 2017-07-26 ENCOUNTER — Encounter: Payer: Self-pay | Admitting: Family Medicine

## 2017-07-26 ENCOUNTER — Ambulatory Visit (INDEPENDENT_AMBULATORY_CARE_PROVIDER_SITE_OTHER): Payer: Medicare HMO | Admitting: Family Medicine

## 2017-07-26 VITALS — BP 128/72 | HR 61 | Ht 61.0 in | Wt 248.0 lb

## 2017-07-26 DIAGNOSIS — I10 Essential (primary) hypertension: Secondary | ICD-10-CM

## 2017-07-26 DIAGNOSIS — M722 Plantar fascial fibromatosis: Secondary | ICD-10-CM | POA: Diagnosis not present

## 2017-07-26 DIAGNOSIS — N183 Chronic kidney disease, stage 3 unspecified: Secondary | ICD-10-CM

## 2017-07-26 DIAGNOSIS — Z6841 Body Mass Index (BMI) 40.0 and over, adult: Secondary | ICD-10-CM | POA: Diagnosis not present

## 2017-07-26 DIAGNOSIS — Z5181 Encounter for therapeutic drug level monitoring: Secondary | ICD-10-CM

## 2017-07-26 MED ORDER — HYDROCORTISONE 2.5 % RE CREA: 1.0000 "application " | TOPICAL_CREAM | Freq: Two times a day (BID) | RECTAL | 0 refills | Status: DC

## 2017-07-26 NOTE — Progress Notes (Signed)
Sharon Reilly is a 62 y.o. female who presents to [facility]: [facility] today for foot pain.   Patient has a history of plantar fasciitis.  She states that the pain is stabbing and worse with activity. It also is bothersome with standing from a seated positionl. She states she is ready for a cortisone shot today. She denies any injury, or radiating pain fever or chills. She feels well otherwise.  Pain has been worse over the last several weeks to months.   Patient notes that she is tying to increase exercise by walking. She notes that walking is painful with the plantar fasciitis.  She notes that she is a member of [employer] and has access to the gym through that but she has trouble getting to the gym because she does not drive.  She would be interested in water aerobics or other exercise or exercise opportunities if she can access the gym.  ROS as above:  Exam:  BP 128/72   Pulse 61   Ht 5\' 1"  (1.549 m)   Wt 248 lb (112.5 kg)   BMI 46.86 kg/m  Gen: Well NAD HEENT: EOMI,  MMM Lungs: Normal work of breathing. CTABL Heart: RRR no MRG Abd: NABS, Soft. Nondistended, Nontender Exts: Brisk capillary refill, warm and well perfused.   MSK:  Right foot Pes planus. No other significant deformity.  ROM intact.  TTP plantar medial calcaneous.  Pulses and capillary refill and sensation are intact.   Procedure: Real-time Ultrasound Guided Injection of right plantar fasciitis.   Device: GE Logiq E   Images permanently stored and available for review in the ultrasound unit. Verbal informed consent obtained.  Discussed risks and benefits of procedure. Warned about infection bleeding damage to structures skin hypopigmentation and fat atrophy among others. Patient expresses understanding and agreement Time-out conducted.   Noted no overlying erythema, induration, or other signs  of local infection.   Skin prepped in a sterile fashion.   Local anesthesia: Topical Ethyl chloride.   With sterile technique and under real time ultrasound guidance:  40mg  kenalog and 2 ml marcaine injected easily.   Completed without difficulty   Pain immediately resolved suggesting accurate placement of the medication.   Advised to call if fevers/chills, erythema, induration, drainage, or persistent bleeding.   Images permanently stored and available for review in the ultrasound unit.  Impression: Technically successful ultrasound guided injection.  Limited musculoskeletal ultrasound of the left plantar fascia reveals thickened plantar fascia with normal-appearing bony structures.  Assessment and Plan: 61 y.o. female with a plantar fasciitis. Patient has failed conservative management and was receptive to a corticosteroid shot today, which we performed with ultrasound guided injection into the plantar fasciitis. Patient's foot pain felt better after the shot indicating proper placement of medicine. She should follow up in 6 weeks. Plan to continue home exercise program as tolerated.   [facility] Care management referral placed for help getting patient to gym for exercise. This would help HTN, Obesity, and CKD as well as plantar fasciitis.    Orders Placed This Encounter  Procedures  . AMB Referral to [facility] Management    Referral Priority:   Routine    Referral Type:   Consultation    Referral Reason:   [facility]Care Management    Number of Visits Requested:   1   No orders of the defined types were placed in this encounter.    Historical information moved  to improve visibility of documentation.  Past Medical History:  Diagnosis Date  . Fibromyalgia   . Hyperlipidemia   . Hypertension   . Lupus ([place])   . Lupus (systemic lupus erythematosus) ([place])   . NSTEMI (non-ST elevated myocardial infarction) ([place]) 03/08/2016   [facility]  . Obesity   . Osteoarthritis   . Osteoporosis    Past  Surgical History:  Procedure Laterality Date  . ABDOMINAL HYSTERECTOMY     partial  . BREAST SURGERY     reduction  . CARPAL TUNNEL RELEASE     left  . epicondylitis     left  . fistulotomy    . great toe fusion     left  . MRI syr ortho spec     Dr. Christen Butter  . REDUCTION MAMMAPLASTY    . right elbow    . surgisis biodesign fistula plug    . talonavicular joint fusion     LT foot   Social History   Tobacco Use  . Smoking status: Never Smoker  . Smokeless tobacco: Never Used  Substance Use Topics  . Alcohol use: No   family history includes Breast cancer in her mother; Diabetes in her brother and unknown relative; Hyperlipidemia in her father and mother; Hypertension in her mother; Kidney failure in her brother; Thyroid disease in her sister.  Medications: Current Outpatient Medications  Medication Sig Dispense Refill  . acetaminophen (TYLENOL) 325 MG tablet Take 650 mg by mouth.    . AMBULATORY NON FORMULARY MEDICATION Medication Name: Call Huey Romans to get a download off her CPAP and fax to our office.  [phone] 1 Units 0  . aspirin 81 MG tablet Take 81 mg by mouth daily.      . fexofenadine (ALLEGRA) 180 MG tablet Take 1 tablet (180 mg total) by mouth daily. 30 tablet 6  . fluticasone (FLOVENT HFA) 110 MCG/ACT inhaler Inhale 1 puff into the lungs daily as needed.    . fluticasone (VERAMYST) 27.5 MCG/SPRAY nasal spray Place 2 sprays into the nose 3 (three) times daily as needed for rhinitis.    . hydrocortisone (ANUSOL-HC) 2.5 % rectal cream Place 1 application rectally 2 (two) times daily. 60 g 0  . losartan-hydrochlorothiazide (HYZAAR) 100-25 MG tablet TAKE ONE TABLET BY MOUTH EVERY DAY 90 tablet 0  . metoprolol tartrate (LOPRESSOR) 100 MG tablet TAKE ONE TABLET BY MOUTH TWICE DAILY 180 tablet 1  . omeprazole (PRILOSEC) 40 MG capsule Take 1 capsule (40 mg total) by mouth 2 (two) times daily. 180 capsule 0  . rosuvastatin (CRESTOR) 10 MG tablet TAKE ONE TABLET BY MOUTH  EVERY DAY 90 tablet 3  . terbinafine (LAMISIL) 250 MG tablet Take 1 tablet (250 mg total) by mouth daily. 30 tablet 5  . Vitamin D, Ergocalciferol, (DRISDOL) 50000 units CAPS capsule      No current facility-administered medications for this visit.    Allergies  Allergen Reactions  . Amoxicillin Hives and Itching    No rash.   . Erythromycin Hives  . Nsaids Other (See Comments)    CKD 3   . Sulfonamide Derivatives   . Doxycycline Hives  . Sulfa Antibiotics Hives and Rash    Health Maintenance Health Maintenance  Topic Date Due  . Hepatitis C Screening  23-Jan-1956  . HIV Screening  07/[birth year]/1970  . INFLUENZA VACCINE  09/16/2017  . MAMMOGRAM  12/10/2018  . TETANUS/TDAP  07/03/2020  . COLONOSCOPY  10/09/2020    Discussed warning signs or symptoms. Please  see discharge instructions. Patient expresses understanding.   I personally was present and performed or re-performed the history, physical exam and medical decision-making activities of this service and have verified that the service and findings are accurately documented in the student's note. ___________________________________________ Lynne Leader M.D., ABFM., CAQSM. Primary Care and Sports Medicine Adjunct Instructor of [employer] [facility] [employer] Medicine

## 2017-07-26 NOTE — Patient Instructions (Addendum)
Thank you for coming in today. Recheck in about 6 weeks.  Call or go to the ER if you develop a large red swollen joint with extreme pain or oozing puss.    Plantar Fasciitis Plantar fasciitis is a painful foot condition that affects the heel. It occurs when the band of tissue that connects the toes to the heel bone (plantar fascia) becomes irritated. This can happen after exercising too much or doing other repetitive activities (overuse injury). The pain from plantar fasciitis can range from mild irritation to severe pain that makes it difficult for you to walk or move. The pain is usually worse in the morning or after you have been sitting or lying down for a while. What are the causes? This condition may be caused by:  Standing for long periods of time.  Wearing shoes that do not fit.  Doing high-impact activities, including running, aerobics, and ballet.  Being overweight.  Having an abnormal way of walking (gait).  Having tight calf muscles.  Having high arches in your feet.  Starting a new athletic activity.  What are the signs or symptoms? The main symptom of this condition is heel pain. Other symptoms include:  Pain that gets worse after activity or exercise.  Pain that is worse in the morning or after resting.  Pain that goes away after you walk for a few minutes.  How is this diagnosed? This condition may be diagnosed based on your signs and symptoms. Your health care provider will also do a physical exam to check for:  A tender area on the bottom of your foot.  A high arch in your foot.  Pain when you move your foot.  Difficulty moving your foot.  You may also need to have imaging studies to confirm the diagnosis. These can include:  X-rays.  Ultrasound.  MRI.  How is this treated? Treatment for plantar fasciitis depends on the severity of the condition. Your treatment may include:  Rest, ice, and over-the-counter pain medicines to manage your  pain.  Exercises to stretch your calves and your plantar fascia.  A splint that holds your foot in a stretched, upward position while you sleep (night splint).  Physical therapy to relieve symptoms and prevent problems in the future.  Cortisone injections to relieve severe pain.  Extracorporeal shock wave therapy (ESWT) to stimulate damaged plantar fascia with electrical impulses. It is often used as a last resort before surgery.  Surgery, if other treatments have not worked after 12 months.  Follow these instructions at home:  Take medicines only as directed by your health care provider.  Avoid activities that cause pain.  Roll the bottom of your foot over a bag of ice or a bottle of cold water. Do this for 20 minutes, 3-4 times a day.  Perform simple stretches as directed by your health care provider.  Try wearing athletic shoes with air-sole or gel-sole cushions or soft shoe inserts.  Wear a night splint while sleeping, if directed by your health care provider.  Keep all follow-up appointments with your health care provider. How is this prevented?  Do not perform exercises or activities that cause heel pain.  Consider finding low-impact activities if you continue to have problems.  Lose weight if you need to. The best way to prevent plantar fasciitis is to avoid the activities that aggravate your plantar fascia. Contact a health care provider if:  Your symptoms do not go away after treatment with home care measures.  Your  pain gets worse.  Your pain affects your ability to move or do your daily activities. This information is not intended to replace advice given to you by your health care provider. Make sure you discuss any questions you have with your health care provider. Document Released: 10/28/2000 Document Revised: 07/08/2015 Document Reviewed: 12/13/2013 Elsevier Interactive Patient Education  [employer].

## 2017-07-26 NOTE — Progress Notes (Signed)
Subjective:    Patient ID: Sharon Reilly, female    DOB: 1956-02-15, 62 y.o.   MRN: [id number]  HPI 62 year old female comes in today complaining of bilateral foot/leg problems.  When I saw her in January she had started physical therapy was noticing some improvement in her pain.  She did attend 2 more sessions and was also taking gabapentin. Bought a gel heel cup and not really helping.  She can try custom orthotics and it actually increased her pain.  She is really worked very hard to get up to walking 1 mile a day and wants to be able to continue to do that and does not want this to slow her down but when she walks after she comes home and then sit down she said it so painful she can barely stand back up on her right foot.  Her knees continue to bother her as well and she does have bilateral Oster arthritis but says is that right foot that is the most bothersome.  She also c/o of low back pain.  Last xray was in 2016 showed diffuse osteopenia with some 5 mm anterolisthesis. She has been walking regularly.  She is up to a mile and doing better.  Hypertension- Pt denies chest pain, SOB, dizziness, or heart palpitations.  Taking meds as directed w/o problems.  Denies medication side effects.     CKD 3  - I had referred her to Nephrology last August. BUt I can't find records.   Lab Results  Component Value Date   CREATININE 1.2 (A) 01/14/2017  Last creatinine with Dr. Duffy Bruce in March was 1.5.    Review of Systems  BP 128/72   Pulse 61   Ht 5\' 1"  (1.549 m)   Wt 248 lb (112.5 kg)   SpO2 99%   BMI 46.86 kg/m     Allergies  Allergen Reactions  . Amoxicillin Hives and Itching    No rash.   . Erythromycin Hives  . Sulfonamide Derivatives   . Doxycycline Hives  . Sulfa Antibiotics Hives and Rash    Past Medical History:  Diagnosis Date  . Fibromyalgia   . Hyperlipidemia   . Hypertension   . Lupus ([place])   . Lupus (systemic lupus erythematosus) ([place])   . NSTEMI (non-ST elevated  myocardial infarction) ([place]) 03/08/2016   [facility]  . Obesity   . Osteoarthritis   . Osteoporosis     Past Surgical History:  Procedure Laterality Date  . ABDOMINAL HYSTERECTOMY     partial  . BREAST SURGERY     reduction  . CARPAL TUNNEL RELEASE     left  . epicondylitis     left  . fistulotomy    . great toe fusion     left  . MRI syr ortho spec     Dr. Christen Butter  . REDUCTION MAMMAPLASTY    . right elbow    . surgisis biodesign fistula plug    . talonavicular joint fusion     LT foot    Social History   Socioeconomic History  . Marital status: Single    Spouse name: Not on file  . Number of children: Not on file  . Years of education: Not on file  . Highest education level: Not on file  Occupational History  . Not on file  Social Needs  . Financial resource strain: Not on file  . Food insecurity:    Worry: Not on file    Inability: Not  on file  . Transportation needs:    Medical: Not on file    Non-medical: Not on file  Tobacco Use  . Smoking status: Never Smoker  . Smokeless tobacco: Never Used  Substance and Sexual Activity  . Alcohol use: No  . Drug use: No  . Sexual activity: Never  Lifestyle  . Physical activity:    Days per week: Not on file    Minutes per session: Not on file  . Stress: Not on file  Relationships  . Social connections:    Talks on phone: Not on file    Gets together: Not on file    Attends religious service: Not on file    Active member of club or organization: Not on file    Attends meetings of clubs or organizations: Not on file    Relationship status: Not on file  . Intimate partner violence:    Fear of current or ex partner: Not on file    Emotionally abused: Not on file    Physically abused: Not on file    Forced sexual activity: Not on file  Other Topics Concern  . Not on file  Social History Narrative  . Not on file    Family History  Problem Relation Age of Onset  . Breast cancer Mother        Diagnosed in  her 73's.  [name] Hyperlipidemia Mother   . Hypertension Mother   . Hyperlipidemia Father   . Diabetes Brother   . Diabetes Unknown        aunt  . Kidney failure Brother   . Thyroid disease Sister     Outpatient Encounter Medications as of 07/26/2017  Medication Sig  . acetaminophen (TYLENOL) 325 MG tablet Take 650 mg by mouth.  . AMBULATORY NON FORMULARY MEDICATION Medication Name: Call Huey Romans to get a download off her CPAP and fax to our office.  [phone]  . aspirin 81 MG tablet Take 81 mg by mouth daily.    . fexofenadine (ALLEGRA) 180 MG tablet Take 1 tablet (180 mg total) by mouth daily.  . fluticasone (FLOVENT HFA) 110 MCG/ACT inhaler Inhale 1 puff into the lungs daily as needed.  . fluticasone (VERAMYST) 27.5 MCG/SPRAY nasal spray Place 2 sprays into the nose 3 (three) times daily as needed for rhinitis.  [name] losartan-hydrochlorothiazide (HYZAAR) 100-25 MG tablet TAKE ONE TABLET BY MOUTH EVERY DAY  . metoprolol tartrate (LOPRESSOR) 100 MG tablet TAKE ONE TABLET BY MOUTH TWICE DAILY  . omeprazole (PRILOSEC) 40 MG capsule Take 1 capsule (40 mg total) by mouth 2 (two) times daily.  . rosuvastatin (CRESTOR) 10 MG tablet TAKE ONE TABLET BY MOUTH EVERY DAY  . terbinafine (LAMISIL) 250 MG tablet Take 1 tablet (250 mg total) by mouth daily.  . Vitamin D, Ergocalciferol, (DRISDOL) 50000 units CAPS capsule   . hydrocortisone (ANUSOL-HC) 2.5 % rectal cream Place 1 application rectally 2 (two) times daily.  . [DISCONTINUED] benzonatate (TESSALON) 200 MG capsule Take 1 capsule (200 mg total) by mouth 3 (three) times daily as needed for cough.  . [DISCONTINUED] guaiFENesin-codeine (ROBITUSSIN AC) 100-10 MG/5ML syrup Take 10 mLs by mouth every 8 (eight) hours as needed for cough.   No facility-administered encounter medications on file as of 07/26/2017.        Objective:   Physical Exam  Constitutional: She is oriented to person, place, and time. She appears well-developed and well-nourished.   HENT:  Head: Normocephalic and atraumatic.  Cardiovascular: Normal rate, regular  rhythm and normal heart sounds.  Pulmonary/Chest: Effort normal and breath sounds normal.  Neurological: She is alert and oriented to person, place, and time.  Skin: Skin is warm and dry.  Psychiatric: She has a normal mood and affect. Her behavior is normal.       Assessment & Plan:  Right plantar fasciitis-at this point she has failed regular exercise as well as heel cups and custom orthotics.  Plan will be to have an injection with Dr. Georgina Snell.   HTN - Well controlled. Continue current regimen. Follow up in  59months. Due for CMP.    CKD 3-following with nephrology now.  We will keep an eye on it fortunately is been stable over the last year.

## 2017-07-27 ENCOUNTER — Other Ambulatory Visit: Payer: Self-pay | Admitting: *Deleted

## 2017-07-27 ENCOUNTER — Other Ambulatory Visit: Payer: Self-pay

## 2017-07-27 ENCOUNTER — Encounter: Payer: Self-pay | Admitting: *Deleted

## 2017-07-27 DIAGNOSIS — R7989 Other specified abnormal findings of blood chemistry: Secondary | ICD-10-CM

## 2017-07-27 LAB — COMPLETE METABOLIC PANEL WITH GFR
AG RATIO: 1.5 (calc) (ref 1.0–2.5)
ALT: 13 U/L (ref 6–29)
AST: 17 U/L (ref 10–35)
Albumin: 3.9 g/dL (ref 3.6–5.1)
Alkaline phosphatase (APISO): 79 U/L (ref 33–130)
BUN/Creatinine Ratio: 17 (calc) (ref 6–22)
BUN: 24 mg/dL (ref 7–25)
CALCIUM: 9.4 mg/dL (ref 8.6–10.4)
CO2: 28 mmol/L (ref 20–32)
Chloride: 105 mmol/L (ref 98–110)
Creat: 1.43 mg/dL — ABNORMAL HIGH (ref 0.50–0.99)
GFR, EST AFRICAN AMERICAN: 45 mL/min/{1.73_m2} — AB (ref >=60)
GFR, EST NON AFRICAN AMERICAN: 39 mL/min/{1.73_m2} — AB (ref >=60)
Globulin: 2.6 g/dL (calc) (ref 1.9–3.7)
Glucose, Bld: 89 mg/dL (ref 65–99)
Potassium: 3.9 mmol/L (ref 3.5–5.3)
Sodium: 140 mmol/L (ref 135–146)
TOTAL PROTEIN: 6.5 g/dL (ref 6.1–8.1)
Total Bilirubin: 0.3 mg/dL (ref 0.2–1.2)

## 2017-07-27 NOTE — Patient Outreach (Signed)
[place] [facility]) Care Management  07/27/2017  JABRIA LOOS 07/25/1955 [id number]   Telephone Screen  Referral Date:07/26/17 Referral Source: MD office Referral Reason: "needs help getting to the gym for HTN and CKD" Insurance: [facility]   Outreach attempt # 1 to patient. Spoke with patient. She reports that she is currently in the store and unable to talk at this time. Advised patient that RN CM would call her back at another time.      Plan: RN CM will make outreach attempt to patient within three business days.   Enzo Montgomery, RN,BSN,CCM [place] Management Telephonic Care Management Coordinator Direct Phone: [phone] Toll Free: [phone] Fax: [phone]

## 2017-07-29 ENCOUNTER — Other Ambulatory Visit: Payer: Self-pay

## 2017-07-29 NOTE — Patient Outreach (Signed)
[facility] [facility]) Care Management  07/29/2017  Sharon Reilly 03-Jan-1956 [id number]   Telephone Screen  Referral Date:07/26/17 Referral Source: MD office Referral Reason: "needs help getting to the gym for HTN and CKD" Insurance: [facility]    Outreach attempt #2 to patient. Spoke with patient. RN CM discussed referral with patient. She reported that she has already applied for free transportation via [employer]. She states that she had originally applied and they lost her paperwork but she has since reapplied and is waiting to get approval. She states that the person she spoke with at agency advised her that she should be approved given that she has Medicaid. She voices that they will be able to take her back and forth to the gym and non medical appts M-F free of charge as long as she schedules/arranges transport in advance. RN CM offered SW assistance but patient feels like she is okay at present and will get approved. She denies having any other needs or concerns that [facility] may be able to assist with at this time. She is agreeable to RN CM mailing [facility] brochure and magnet to her home for future reference. Patient appreciative of follow up call.        Plan: RN CM will close case at this time as no further interventions needed. RN CM will send [facility] brochure and magnet for future reference.    Enzo Montgomery, RN,BSN,CCM [place] Management Telephonic Care Management Coordinator Direct Phone: [phone] Toll Free: [phone] Fax: [phone]

## 2017-08-02 ENCOUNTER — Other Ambulatory Visit: Payer: Self-pay | Admitting: Family Medicine

## 2017-08-02 ENCOUNTER — Telehealth: Payer: Self-pay

## 2017-08-02 NOTE — Telephone Encounter (Signed)
[name] called and left the name and fax number for the transportation forms. Sharon Reilly fax [phone].

## 2017-08-02 NOTE — Telephone Encounter (Signed)
Forms faxed, confirmation received.Sharon Reilly, [place]

## 2017-08-05 DIAGNOSIS — H527 Unspecified disorder of refraction: Secondary | ICD-10-CM | POA: Diagnosis not present

## 2017-08-16 ENCOUNTER — Other Ambulatory Visit: Payer: Self-pay | Admitting: Family Medicine

## 2017-09-01 ENCOUNTER — Telehealth: Payer: Self-pay | Admitting: *Deleted

## 2017-09-01 NOTE — Telephone Encounter (Signed)
Informed pt that forms are up front for her to p/u.Sharon Reilly, [place]

## 2017-09-06 ENCOUNTER — Ambulatory Visit (INDEPENDENT_AMBULATORY_CARE_PROVIDER_SITE_OTHER): Payer: Medicare HMO | Admitting: Family Medicine

## 2017-09-06 ENCOUNTER — Encounter: Payer: Self-pay | Admitting: Family Medicine

## 2017-09-06 VITALS — BP 146/59 | HR 55 | Wt 247.0 lb

## 2017-09-06 DIAGNOSIS — M722 Plantar fascial fibromatosis: Secondary | ICD-10-CM | POA: Diagnosis not present

## 2017-09-06 NOTE — Progress Notes (Signed)
Sharon Reilly is a 62 y.o. female who presents to [place] today for foot pain.   The patient was seen for her plantar fasciitis 6 weeks ago and received a steroid injection. She says since then she has had moderate pain relief which she mostly attributes to walking less and resting the foot rather than the injection.  She reports no pain at rest but says the pain persists when walking. She says the pain is usually on the medial arch but sometimes it hurts on the lateral aspect of the arch. She is currently not taking any medication specifically for the pain but says she does take daily tylenol for her arthritis. She says PT has helped in the past and they showed her some exercises to do at home which she has been doing since the injection. She says these exercises have helped.    ROS:  As above  Exam:  BP (!) 146/59   Pulse (!) 55   Wt 247 lb (112 kg)   BMI 46.67 kg/m  General: Well Developed, well nourished, and in no acute distress.  Neuro/Psych: Alert and oriented x3, extra-ocular muscles intact, able to move all 4 extremities, sensation grossly intact. Skin: Warm and dry, no rashes noted.  Respiratory: Not using accessory muscles, speaking in full sentences, trachea midline.  Cardiovascular: Pulses palpable, no extremity edema. Abdomen: Does not appear distended. Right Foot: well appearing no effusion Tender to palpation on in the medial arch.  Non tender to active or passive motion.     Assessment and Plan: 62 y.o. female with plantar fasciitis here for follow up following an injection 6 weeks ago. She has some moderate pain relief following the injection. She no longer has pain at rest and overall the pain has decreased. The plan will be to continue with conservative therapy including ice massage and exercises. She was educated on the proper way to do exercises which should help. She was also instructed to bring her orthotics that  she has for previous hallux rigidus and we can adjust them for her plantar fasciitis.    I spent 15 minutes with this patient, greater than 50% was face-to-face time counseling regarding ddx and plan.   Historical information moved to improve visibility of documentation.  Past Medical History:  Diagnosis Date  . Fibromyalgia   . Hyperlipidemia   . Hypertension   . Lupus ([place])   . Lupus (systemic lupus erythematosus) ([place])   . NSTEMI (non-ST elevated myocardial infarction) ([place]) 03/08/2016   [facility]  . Obesity   . Osteoarthritis   . Osteoporosis    Past Surgical History:  Procedure Laterality Date  . ABDOMINAL HYSTERECTOMY     partial  . BREAST SURGERY     reduction  . CARPAL TUNNEL RELEASE     left  . epicondylitis     left  . fistulotomy    . great toe fusion     left  . MRI syr ortho spec     Dr. Christen Butter  . REDUCTION MAMMAPLASTY    . right elbow    . surgisis biodesign fistula plug    . talonavicular joint fusion     LT foot   Social History   Tobacco Use  . Smoking status: Never Smoker  . Smokeless tobacco: Never Used  Substance Use Topics  . Alcohol use: No   family history includes Breast cancer in her mother; Diabetes in her brother and unknown relative; Hyperlipidemia in  her father and mother; Hypertension in her mother; Kidney failure in her brother; Thyroid disease in her sister.  Medications: Current Outpatient Medications  Medication Sig Dispense Refill  . acetaminophen (TYLENOL) 325 MG tablet Take 650 mg by mouth.    . AMBULATORY NON FORMULARY MEDICATION Medication Name: Call Huey Romans to get a download off her CPAP and fax to our office.  [phone] 1 Units 0  . aspirin 81 MG tablet Take 81 mg by mouth daily.      . fexofenadine (ALLEGRA) 180 MG tablet Take 1 tablet (180 mg total) by mouth daily. 30 tablet 6  . fluticasone (FLOVENT HFA) 110 MCG/ACT inhaler Inhale 1 puff into the lungs daily as needed.    . fluticasone (VERAMYST) 27.5 MCG/SPRAY nasal  spray Place 2 sprays into the nose 3 (three) times daily as needed for rhinitis.    . hydrocortisone (ANUSOL-HC) 2.5 % rectal cream Place 1 application rectally 2 (two) times daily. 60 g 0  . losartan-hydrochlorothiazide (HYZAAR) 100-25 MG tablet TAKE ONE TABLET BY MOUTH EVERY DAY 90 tablet 0  . metoprolol tartrate (LOPRESSOR) 100 MG tablet TAKE ONE TABLET BY MOUTH TWICE DAILY 180 tablet 1  . omeprazole (PRILOSEC) 40 MG capsule Take 1 capsule (40 mg total) by mouth 2 (two) times daily. 180 capsule 0  . rosuvastatin (CRESTOR) 10 MG tablet TAKE ONE TABLET BY MOUTH EVERY DAY 90 tablet 3  . terbinafine (LAMISIL) 250 MG tablet Take 1 tablet (250 mg total) by mouth daily. 30 tablet 5  . Vitamin D, Ergocalciferol, (DRISDOL) 50000 units CAPS capsule      No current facility-administered medications for this visit.    Allergies  Allergen Reactions  . Amoxicillin Hives and Itching    No rash.   . Erythromycin Hives  . Nsaids Other (See Comments)    CKD 3   . Sulfonamide Derivatives   . Doxycycline Hives  . Sulfa Antibiotics Hives and Rash      Discussed warning signs or symptoms. Please see discharge instructions. Patient expresses understanding.  I personally was present and performed or re-performed the history, physical exam and medical decision-making activities of this service and have verified that the service and findings are accurately documented in the student's note. ___________________________________________ Lynne Leader M.D., ABFM., CAQSM. Primary Care and Sports Medicine Adjunct Instructor of [employer]

## 2017-09-06 NOTE — Patient Instructions (Addendum)
Thank you for coming in today. Let me know if you need extra help with the [employer] application.  Add the heel raises. Go down slowly.  Let me know if you need anything.  Contact [employer] again.  Recheck as needed.  Consider bring your orthotics and your shoes.  We can modify the orthotics.

## 2017-09-13 DIAGNOSIS — M150 Primary generalized (osteo)arthritis: Secondary | ICD-10-CM | POA: Diagnosis not present

## 2017-09-13 DIAGNOSIS — M797 Fibromyalgia: Secondary | ICD-10-CM | POA: Diagnosis not present

## 2017-09-15 ENCOUNTER — Other Ambulatory Visit: Payer: Self-pay | Admitting: Family Medicine

## 2017-10-05 DIAGNOSIS — R69 Illness, unspecified: Secondary | ICD-10-CM | POA: Diagnosis not present

## 2017-10-26 DIAGNOSIS — M329 Systemic lupus erythematosus, unspecified: Secondary | ICD-10-CM | POA: Diagnosis not present

## 2017-10-26 DIAGNOSIS — N2581 Secondary hyperparathyroidism of renal origin: Secondary | ICD-10-CM | POA: Diagnosis not present

## 2017-10-26 DIAGNOSIS — N183 Chronic kidney disease, stage 3 (moderate): Secondary | ICD-10-CM | POA: Diagnosis not present

## 2017-10-26 DIAGNOSIS — D631 Anemia in chronic kidney disease: Secondary | ICD-10-CM | POA: Diagnosis not present

## 2017-10-26 DIAGNOSIS — R7301 Impaired fasting glucose: Secondary | ICD-10-CM | POA: Diagnosis not present

## 2017-10-26 DIAGNOSIS — I10 Essential (primary) hypertension: Secondary | ICD-10-CM | POA: Diagnosis not present

## 2017-10-27 ENCOUNTER — Telehealth: Payer: Self-pay | Admitting: Family Medicine

## 2017-10-27 NOTE — Telephone Encounter (Signed)
Patient came in to inquire about labs that were ordered. She saw nephrology yesterday who also ordered labs. Patient wanted to know if the labs that were drawn yesterday at [facility] give [place] the information that we need so that she does not need to have blood drawn a second time. Could you review her labs from [place] [facility] and advise the patient on what she needs to do?? Please and thank you!

## 2017-10-27 NOTE — Telephone Encounter (Signed)
Dr. Madilyn Fireman, Ms. Deviney had labs done at her nephrologist office yesterday and wanted to know if you still wanted her to have her repeat the CMP that had an elevated creatinine on 07/26/17. She was to have this repeated in 3 months. Her repeat level was 1.37.   Lab results printed and placed in pcp's box for review. Maryruth Eve, [name], CMA

## 2017-10-27 NOTE — Telephone Encounter (Signed)
Awesome thank her for the update.  She doesn't need to go for CMP at this time.

## 2017-10-28 NOTE — Telephone Encounter (Signed)
Called and LM on VM that she did not need to have CMP lab work done at this time. KG LPN

## 2017-11-03 ENCOUNTER — Other Ambulatory Visit: Payer: Self-pay | Admitting: Family Medicine

## 2017-11-03 DIAGNOSIS — Z1239 Encounter for other screening for malignant neoplasm of breast: Secondary | ICD-10-CM

## 2017-11-05 NOTE — Progress Notes (Signed)
Subjective:   JANAY CANAN is a 62 y.o. female who presents for Medicare Annual (Subsequent) preventive examination.  Review of Systems:  No ROS.  Medicare Wellness Visit. Additional risk factors are reflected in the social history.  Cardiac Risk Factors include: dyslipidemia;advanced age (>57men, >56 women);hypertension;obesity (BMI >30kg/m2);sedentary lifestyle Sleep patterns:Gets around 6 hours of sleep at night. Wakes up 2-3 times to void. Wakes up feeling good.   Home Safety/Smoke Alarms: Feels safe in home. Smoke alarms in place.  Living environment;lives alone in 2 story apartment. Handrails are in place on stairs and there is also an elevator. Does puzzle games, wordscapes daily for brain stimulating. Has step over shower but grab rails are in place. .   Female:   Pap- aged out       [place]- scheduled on 12/09/2017       Dexa scan- utd       CCS- utd     Objective:     Vitals: BP (!) 152/58   Pulse 60   Ht 5\' 1"  (1.549 m)   Wt 254 lb (115.2 kg)   SpO2 100%   BMI 47.99 kg/m   Body mass index is 47.99 kg/m.  Advanced Directives 11/16/2017 02/17/2017 03/23/2016 09/27/2013 05/31/2013  Does Patient Have a Medical Advance Directive? No No (No Data) No Patient does not have advance directive;Patient would like information  Would patient like information on creating a medical advance directive? No - Patient declined No - Patient declined - No - patient declined information Advance directive packet given    Tobacco Social History   Tobacco Use  Smoking Status Never Smoker  Smokeless Tobacco Never Used     Counseling given: Not Answered   Clinical Intake:  Pre-visit preparation completed: Yes  Pain : 0-10 Pain Score: 6  Pain Type: Chronic pain Pain Location: Knee(Patient states hurts all over in her joints. Patient does have arthritis) Pain Orientation: Right, Left Pain Radiating Towards: back radiates down both legs Pain Descriptors / Indicators: Stabbing,  Constant, Aching Pain Onset: More than a month ago Pain Frequency: Intermittent Pain Relieving Factors: walking helps but now having pain in feet. Uses walker and cane  Pain Relieving Factors: walking helps but now having pain in feet. Uses walker and cane  Nutritional Status: BMI 25 -29 Overweight Nutritional Risks: None Diabetes: No  How often do you need to have someone help you when you read instructions, pamphlets, or other written materials from your doctor or pharmacy?: 1 - Never What is the last grade level you completed in school?: college  Interpreter Needed?: No     Past Medical History:  Diagnosis Date  . Fibromyalgia   . Hyperlipidemia   . Hypertension   . Lupus ([place])   . Lupus (systemic lupus erythematosus) ([place])   . NSTEMI (non-ST elevated myocardial infarction) ([place]) 03/08/2016   [facility]  . Obesity   . Osteoarthritis   . Osteoporosis    Past Surgical History:  Procedure Laterality Date  . ABDOMINAL HYSTERECTOMY     partial  . BREAST SURGERY     reduction  . CARPAL TUNNEL RELEASE     left  . CHOLECYSTECTOMY Bilateral 05/14/2016  . epicondylitis     left  . fistulotomy    . great toe fusion     left  . MRI syr ortho spec     Dr. Christen Butter  . REDUCTION MAMMAPLASTY    . right elbow    . surgisis biodesign fistula plug    .  talonavicular joint fusion     LT foot   Family History  Problem Relation Age of Onset  . Breast cancer Mother        Diagnosed in her 65's.  [name] Hyperlipidemia Mother   . Hypertension Mother   . Hyperlipidemia Father   . Diabetes Brother   . Diabetes Unknown        aunt  . Kidney failure Brother   . Thyroid disease Sister    Social History   Socioeconomic History  . Marital status: Single    Spouse name: Not on file  . Number of children: 1  . Years of education: 2 years college  . Highest education level: 12th grade  Occupational History  . Occupation: disabled    Comment: insurance  Social Needs  . Financial  resource strain: Not hard at all  . Food insecurity:    Worry: Never true    Inability: Never true  . Transportation needs:    Medical: No    Non-medical: No  Tobacco Use  . Smoking status: Never Smoker  . Smokeless tobacco: Never Used  Substance and Sexual Activity  . Alcohol use: No  . Drug use: No  . Sexual activity: Never  Lifestyle  . Physical activity:    Days per week: 0 days    Minutes per session: 0 min  . Stress: Not at all  Relationships  . Social connections:    Talks on phone: More than three times a week    Gets together: More than three times a week    Attends religious service: More than 4 times per year    Active member of club or organization: No    Attends meetings of clubs or organizations: Never    Relationship status: Patient refused  Other Topics Concern  . Not on file  Social History Narrative   Lives in apartment at a senior living area. Socializes everday.    Outpatient Encounter Medications as of 11/16/2017  Medication Sig  . acetaminophen (TYLENOL) 325 MG tablet Take 650 mg by mouth.  . AMBULATORY NON FORMULARY MEDICATION Medication Name: Call Huey Romans to get a download off her CPAP and fax to our office.  [phone]  . aspirin 81 MG tablet Take 81 mg by mouth daily.    . calcium carbonate (OSCAL) 1500 (600 Ca) MG TABS tablet Take 600 mg of elemental calcium by mouth daily with breakfast.  . fexofenadine (ALLEGRA) 180 MG tablet Take 1 tablet (180 mg total) by mouth daily.  . fluticasone (FLOVENT HFA) 110 MCG/ACT inhaler Inhale 1 puff into the lungs daily as needed.  . hydrocortisone (ANUSOL-HC) 2.5 % rectal cream Place 1 application rectally 2 (two) times daily.  [name] losartan-hydrochlorothiazide (HYZAAR) 100-25 MG tablet TAKE ONE TABLET BY MOUTH EVERY DAY  . metoprolol tartrate (LOPRESSOR) 100 MG tablet TAKE ONE TABLET BY MOUTH TWICE DAILY  . omeprazole (PRILOSEC) 40 MG capsule Take 1 capsule (40 mg total) by mouth 2 (two) times daily.  .  rosuvastatin (CRESTOR) 10 MG tablet TAKE ONE TABLET BY MOUTH EVERY DAY  . terbinafine (LAMISIL) 250 MG tablet Take 1 tablet (250 mg total) by mouth daily.  . fluticasone (VERAMYST) 27.5 MCG/SPRAY nasal spray Place 2 sprays into the nose 3 (three) times daily as needed for rhinitis.  . Vitamin D, Ergocalciferol, (DRISDOL) 50000 units CAPS capsule    No facility-administered encounter medications on file as of 11/16/2017.     Activities of Daily Living In your present state  of health, do you have any difficulty performing the following activities: 11/16/2017  Hearing? N  Vision? N  Difficulty concentrating or making decisions? Y  Comment patient has noticed a little so writes notes for her self also does word puzzles  Walking or climbing stairs? N  Dressing or bathing? N  Doing errands, shopping? N  Preparing Food and eating ? N  Using the Toilet? N  In the past six months, have you accidently leaked urine? Y  Comment difficulty holding urine in the last 6 months.  Do you have problems with loss of bowel control? Y  Comment constipation  Managing your Medications? N  Managing your Finances? N  Housekeeping or managing your Housekeeping? N  Some recent data might be hidden    Patient Care Team: Hali Marry, MD as PCP - General (Family Medicine)    Assessment:   This is a routine wellness examination for [place]. Physical assessment deferred to PCP.   Exercise Activities and Dietary recommendations Current Exercise Habits: The patient does not participate in regular exercise at present, Exercise limited by: cardiac condition(s);orthopedic condition(s) Diet tries to eat vegetables and fruits but this causes acid reflux. Does eat a lot of fast food. Instructed to watch eating fast food because of high calorie count and high salt. Breakfast:bagel Lunch: egg salad sandwich Dinner:  Beans and rice with sausage.     Goals    . Exercise 150 min/wk Moderate Activity     Once  back pain is resolved and feet pain is better will start back exercising and walking. Was walking 1 mile before this started happening.    . Exercise 3x per week (30 min per time) (pt-stated)     She would like to participate in a structured exercise program such as Silver Sneakers or water aerobics a few times per week at the [facility].       Fall Risk Fall Risk  11/16/2017 03/23/2016  Falls in the past year? No No  Risk for fall due to : Impaired balance/gait Impaired balance/gait;Impaired mobility   Is the patient's home free of loose throw rugs in walkways, pet beds, electrical cords, etc?   yes      Grab bars in the bathroom? no      Handrails on the stairs?   yes      Adequate lighting?   yes   Depression Screen PHQ 2/9 Scores 11/16/2017 07/29/2017 04/08/2017 09/21/2016  PHQ - 2 Score 0 0 0 1  PHQ- 9 Score - - 2 -     Cognitive Function     6CIT Screen 11/16/2017 03/23/2016  What Year? 0 points 0 points  What month? 0 points 0 points  What time? 0 points 0 points  Count back from 20 0 points -  Months in reverse 0 points 0 points  Repeat phrase 0 points 0 points  Total Score 0 -    Immunization History  Administered Date(s) Administered  . Influenza Split 12/24/2010  . Influenza, High Dose Seasonal PF 11/16/2017  . Influenza,inj,Quad PF,6+ Mos 11/29/2012, 10/04/2013, 11/16/2014, 11/21/2015, 10/13/2016  . Td 07/04/2010    Screening Tests Health Maintenance  Topic Date Due  . Hepatitis C Screening  12-Dec-1955  . HIV Screening  07/06/1970  . INFLUENZA VACCINE  09/16/2017  . MAMMOGRAM  12/10/2018  . TETANUS/TDAP  07/03/2020  . COLONOSCOPY  10/09/2020      Plan:  Please schedule your next medicare wellness visit with me in 1 yr.  Ms. Cortina , Thank you for taking time to come for your Medicare Wellness Visit. I appreciate your ongoing commitment to your health goals. Please review the following plan we discussed and let me know if I can assist you in the future.    Continue doing brain stimulating activities (puzzles, reading, adult coloring books, staying active) to keep memory sharp.    These are the goals we discussed: Goals    . Exercise 150 min/wk Moderate Activity     Once back pain is resolved and feet pain is better will start back exercising and walking. Was walking 1 mile before this started happening.    . Exercise 3x per week (30 min per time) (pt-stated)     She would like to participate in a structured exercise program such as Silver Sneakers or water aerobics a few times per week at the [facility].       This is a list of the screening recommended for you and due dates:  Health Maintenance  Topic Date Due  .  Hepatitis C: One time screening is recommended by Center for Disease Control  (CDC) for  adults born from [birth year] through 1965.   December 10, 1955  . HIV Screening  07/06/1970  . Flu Shot  09/16/2017  . Mammogram  12/10/2018  . Tetanus Vaccine  07/03/2020  . Colon Cancer Screening  10/09/2020     I have personally reviewed and noted the following in the patient's chart:   . Medical and social history . Use of alcohol, tobacco or illicit drugs  . Current medications and supplements . Functional ability and status . Nutritional status . Physical activity . Advanced directives . List of other physicians . Hospitalizations, surgeries, and ER visits in previous 12 months . Vitals . Screenings to include cognitive, depression, and falls . Referrals and appointments  In addition, I have reviewed and discussed with patient certain preventive protocols, quality metrics, and best practice recommendations. A written personalized care plan for preventive services as well as general preventive health recommendations were provided to patient.     Joanne Chars, LPN  [id number]/[id number]

## 2017-11-16 ENCOUNTER — Ambulatory Visit (INDEPENDENT_AMBULATORY_CARE_PROVIDER_SITE_OTHER): Payer: Medicare HMO | Admitting: *Deleted

## 2017-11-16 VITALS — BP 152/58 | HR 60 | Ht 61.0 in | Wt 254.0 lb

## 2017-11-16 DIAGNOSIS — Z23 Encounter for immunization: Secondary | ICD-10-CM | POA: Diagnosis not present

## 2017-11-16 DIAGNOSIS — Z0000 Encounter for general adult medical examination without abnormal findings: Secondary | ICD-10-CM | POA: Diagnosis not present

## 2017-11-16 NOTE — Patient Instructions (Signed)
Please schedule your next medicare wellness visit with me in 1 yr.  Sharon Reilly , Thank you for taking time to come for your Medicare Wellness Visit. I appreciate your ongoing commitment to your health goals. Please review the following plan we discussed and let me know if I can assist you in the future.   Continue doing brain stimulating activities (puzzles, reading, adult coloring books, staying active) to keep memory sharp.  These are the goals we discussed: Goals    . Exercise 150 min/wk Moderate Activity     Once back pain is resolved and feet pain is better will start back exercising and walking. Was walking 1 mile before this started happening.    . Exercise 3x per week (30 min per time) (pt-stated)     She would like to participate in a structured exercise program such as Silver Sneakers or water aerobics a few times per week at the [facility].

## 2017-11-18 ENCOUNTER — Other Ambulatory Visit: Payer: Self-pay | Admitting: Family Medicine

## 2017-11-25 ENCOUNTER — Encounter: Payer: Self-pay | Admitting: Family Medicine

## 2017-11-25 ENCOUNTER — Ambulatory Visit (INDEPENDENT_AMBULATORY_CARE_PROVIDER_SITE_OTHER): Payer: Medicare HMO | Admitting: Family Medicine

## 2017-11-25 VITALS — BP 127/56 | HR 57 | Ht 61.0 in | Wt 250.0 lb

## 2017-11-25 DIAGNOSIS — R32 Unspecified urinary incontinence: Secondary | ICD-10-CM | POA: Diagnosis not present

## 2017-11-25 DIAGNOSIS — R7301 Impaired fasting glucose: Secondary | ICD-10-CM

## 2017-11-25 DIAGNOSIS — R222 Localized swelling, mass and lump, trunk: Secondary | ICD-10-CM

## 2017-11-25 LAB — POCT URINALYSIS DIPSTICK
BILIRUBIN UA: NEGATIVE
Glucose, UA: NEGATIVE
Ketones, UA: NEGATIVE
Nitrite, UA: NEGATIVE
Protein, UA: NEGATIVE
Spec Grav, UA: 1.015 (ref 1.010–1.025)
Urobilinogen, UA: 0.2 E.U./dL
pH, UA: 5.5 (ref 5.0–8.0)

## 2017-11-25 LAB — POCT GLYCOSYLATED HEMOGLOBIN (HGB A1C): Hemoglobin A1C: 6 % — AB (ref 4.0–5.6)

## 2017-11-25 NOTE — Progress Notes (Signed)
Subjective:    Patient ID: Sharon Reilly, female    DOB: 08-12-1955, 62 y.o.   MRN: [id number]  HPI  3 months of urinary urgency.   She denies any blood in the urine or dysuria.  She typically gets up 2-3 times per night to urinate but says she also drinks a half a bottle of water with her medications right before bedtime.  She also notes that when she does have to urinate she will often have to pause in between before she is able to completely empty her bladder.  She has not had any incontinence.  She does have a history of fibroids that evidently had attached themselves to her bladder so the actually had to do some type of surgical procedure to her bladder to remove the fibroids.  Denies any fevers, sweats or chills.  She did have an upper respiratory infection last week where she had joint aches fatigue cough and fever but says she feels better this week.  She also feels a fullness to the right of the umbilicus.  She says is not painful or bothersome but just noticed and wanted to make sure that it was okay.  Impaired fasting glucose-no increased thirst or urination. No symptoms consistent with hypoglycemia.   Review of Systems  BP (!) 127/56   Pulse (!) 57   Ht 5\' 1"  (1.549 m)   Wt 250 lb (113.4 kg)   SpO2 100%   BMI 47.24 kg/m     Allergies  Allergen Reactions  . Amoxicillin Hives and Itching    No rash.   . Erythromycin Hives  . Nsaids Other (See Comments)    CKD 3   . Sulfonamide Derivatives   . Doxycycline Hives  . Sulfa Antibiotics Hives and Rash    Past Medical History:  Diagnosis Date  . Fibromyalgia   . Hyperlipidemia   . Hypertension   . Lupus ([place])   . Lupus (systemic lupus erythematosus) ([place])   . NSTEMI (non-ST elevated myocardial infarction) ([place]) 03/08/2016   [facility]  . Obesity   . Osteoarthritis   . Osteoporosis     Past Surgical History:  Procedure Laterality Date  . ABDOMINAL HYSTERECTOMY     partial  . BREAST SURGERY     reduction  .  CARPAL TUNNEL RELEASE     left  . CHOLECYSTECTOMY Bilateral 05/14/2016  . epicondylitis     left  . fistulotomy    . great toe fusion     left  . MRI syr ortho spec     Dr. Christen Butter  . REDUCTION MAMMAPLASTY    . right elbow    . surgisis biodesign fistula plug    . talonavicular joint fusion     LT foot    Social History   Socioeconomic History  . Marital status: Single    Spouse name: Not on file  . Number of children: 1  . Years of education: 2 years college  . Highest education level: 12th grade  Occupational History  . Occupation: disabled    Comment: insurance  Social Needs  . Financial resource strain: Not hard at all  . Food insecurity:    Worry: Never true    Inability: Never true  . Transportation needs:    Medical: No    Non-medical: No  Tobacco Use  . Smoking status: Never Smoker  . Smokeless tobacco: Never Used  Substance and Sexual Activity  . Alcohol use: No  . Drug use: No  .  Sexual activity: Never  Lifestyle  . Physical activity:    Days per week: 0 days    Minutes per session: 0 min  . Stress: Not at all  Relationships  . Social connections:    Talks on phone: More than three times a week    Gets together: More than three times a week    Attends religious service: More than 4 times per year    Active member of club or organization: No    Attends meetings of clubs or organizations: Never    Relationship status: Patient refused  . Intimate partner violence:    Fear of current or ex partner: No    Emotionally abused: No    Physically abused: No    Forced sexual activity: No  Other Topics Concern  . Not on file  Social History Narrative   Lives in apartment at a senior living area. Socializes everday.    Family History  Problem Relation Age of Onset  . Breast cancer Mother        Diagnosed in her 68's.  [name] Hyperlipidemia Mother   . Hypertension Mother   . Hyperlipidemia Father   . Diabetes Brother   . Diabetes Unknown        aunt   . Kidney failure Brother   . Thyroid disease Sister     Outpatient Encounter Medications as of 11/25/2017  Medication Sig  . acetaminophen (TYLENOL) 325 MG tablet Take 650 mg by mouth.  . AMBULATORY NON FORMULARY MEDICATION Medication Name: Call Huey Romans to get a download off her CPAP and fax to our office.  [phone]  . aspirin 81 MG tablet Take 81 mg by mouth daily.    . calcium carbonate (OSCAL) 1500 (600 Ca) MG TABS tablet Take 600 mg of elemental calcium by mouth daily with breakfast.  . fexofenadine (ALLEGRA) 180 MG tablet Take 1 tablet (180 mg total) by mouth daily.  . fluticasone (FLOVENT HFA) 110 MCG/ACT inhaler Inhale 1 puff into the lungs daily as needed.  . fluticasone (VERAMYST) 27.5 MCG/SPRAY nasal spray Place 2 sprays into the nose 3 (three) times daily as needed for rhinitis.  . hydrocortisone (ANUSOL-HC) 2.5 % rectal cream Place 1 application rectally 2 (two) times daily.  [name] losartan-hydrochlorothiazide (HYZAAR) 100-25 MG tablet TAKE ONE TABLET BY MOUTH EVERY DAY  . metoprolol tartrate (LOPRESSOR) 100 MG tablet TAKE ONE TABLET BY MOUTH TWICE DAILY  . omeprazole (PRILOSEC) 40 MG capsule Take 1 capsule (40 mg total) by mouth 2 (two) times daily.  . rosuvastatin (CRESTOR) 10 MG tablet TAKE ONE TABLET BY MOUTH EVERY DAY  . terbinafine (LAMISIL) 250 MG tablet Take 1 tablet (250 mg total) by mouth daily.  . Vitamin D, Ergocalciferol, (DRISDOL) 50000 units CAPS capsule    No facility-administered encounter medications on file as of 11/25/2017.          Objective:   Physical Exam  Constitutional: She is oriented to person, place, and time. She appears well-developed and well-nourished.  HENT:  Head: Normocephalic and atraumatic.  Cardiovascular: Normal rate, regular rhythm and normal heart sounds.  Pulmonary/Chest: Effort normal and breath sounds normal.  Abdominal: Soft.  A bit of fullness just to the right of the umbilicus I do not feel a distinct nodule or hardening  underneath the skin.  I suspect the weakened area in the abdominal wall and most like a mild hernia.  Neurological: She is alert and oriented to person, place, and time.  Skin: Skin is warm  and dry.  Psychiatric: She has a normal mood and affect. Her behavior is normal.        Assessment & Plan:  Urinary urgency -will check urinalysis for infection as well as glucose urea which can cause similar symptoms.  Consider that it could be overactive bladder.  Though interestingly she has had some type of bladder repair which she had to have fibroids that had gone on to her bladder removed.  That certainly could increase the risk for scar tissue etc. and may be worth a visit to the urologist for further work-up.  Encouraged her to consider it or we can just consider trial of medication.  IFG -A1c overall looks great at 6.0.  We will continue to monitor.  Plan to recheck in 6 months.  Continue to work on diet and exercise. Lab Results  Component Value Date   HGBA1C 6.0 (A) 11/25/2017     Nocturia -could also be from overactive bladder.  She already tries to limit fluid before bedtime she just takes a little bit when she takes her medication.  Lump abd wall - likely weak area in the abdominal wall.  Do not feel an true full defect in the wall and I do not feel any mass such as lipoma etc.

## 2017-11-26 LAB — URINE CULTURE
MICRO NUMBER:: 91223757
RESULT: NO GROWTH
SPECIMEN QUALITY:: ADEQUATE

## 2017-11-28 NOTE — Progress Notes (Signed)
Your patient 

## 2017-12-09 ENCOUNTER — Ambulatory Visit (INDEPENDENT_AMBULATORY_CARE_PROVIDER_SITE_OTHER): Payer: Medicare HMO

## 2017-12-09 DIAGNOSIS — Z1231 Encounter for screening mammogram for malignant neoplasm of breast: Secondary | ICD-10-CM | POA: Diagnosis not present

## 2017-12-09 DIAGNOSIS — Z1239 Encounter for other screening for malignant neoplasm of breast: Secondary | ICD-10-CM

## 2017-12-13 ENCOUNTER — Other Ambulatory Visit: Payer: Self-pay | Admitting: Family Medicine

## 2017-12-14 DIAGNOSIS — M797 Fibromyalgia: Secondary | ICD-10-CM | POA: Diagnosis not present

## 2017-12-14 DIAGNOSIS — M150 Primary generalized (osteo)arthritis: Secondary | ICD-10-CM | POA: Diagnosis not present

## 2018-01-07 IMAGING — DX DG CHEST 2V
2 series · 2 of 2 positions shown · non-contrast
Comparison: 01/19/2014

CLINICAL DATA: Acute bronchitis with cough chest pain

EXAM:
CHEST  2 VIEW

[chest pa]
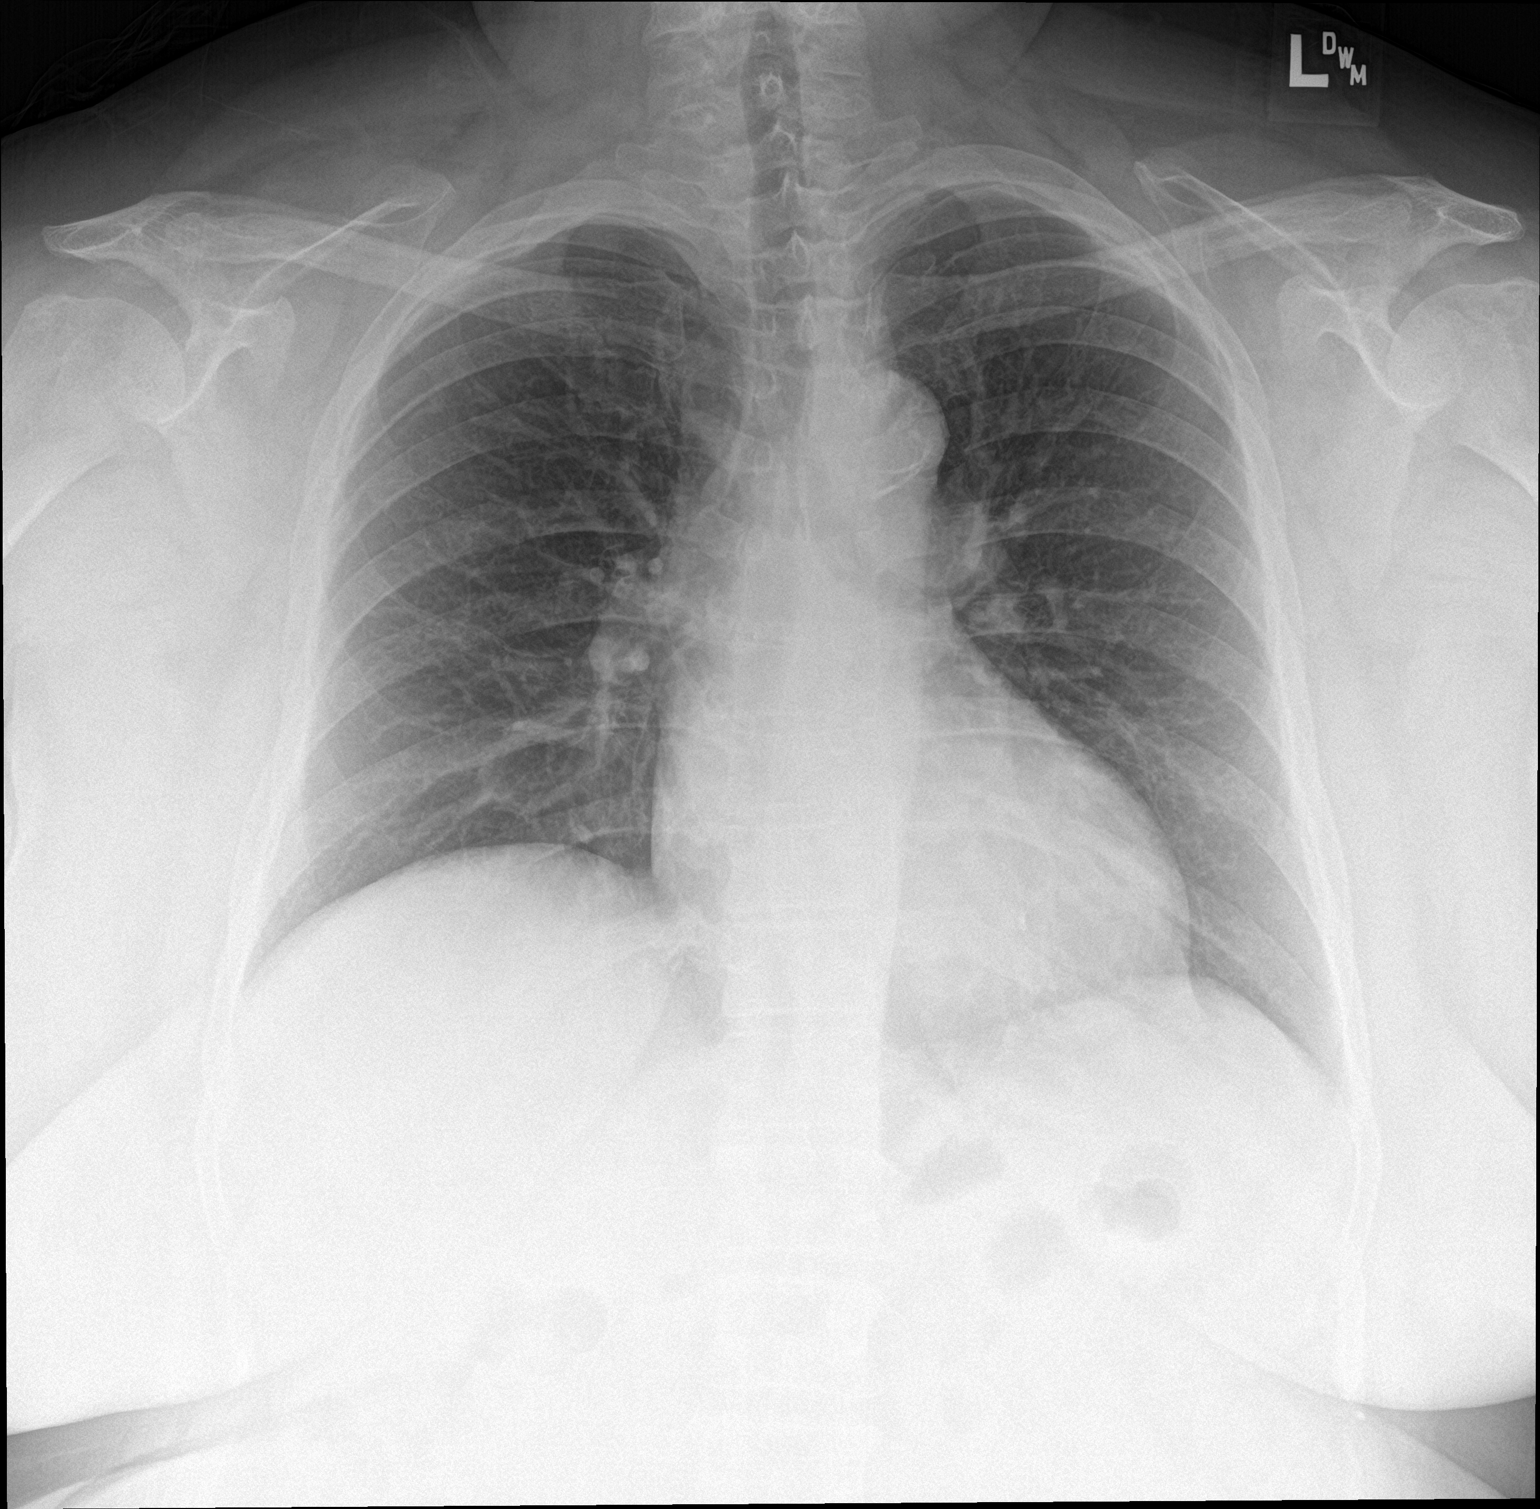

[chest lat]
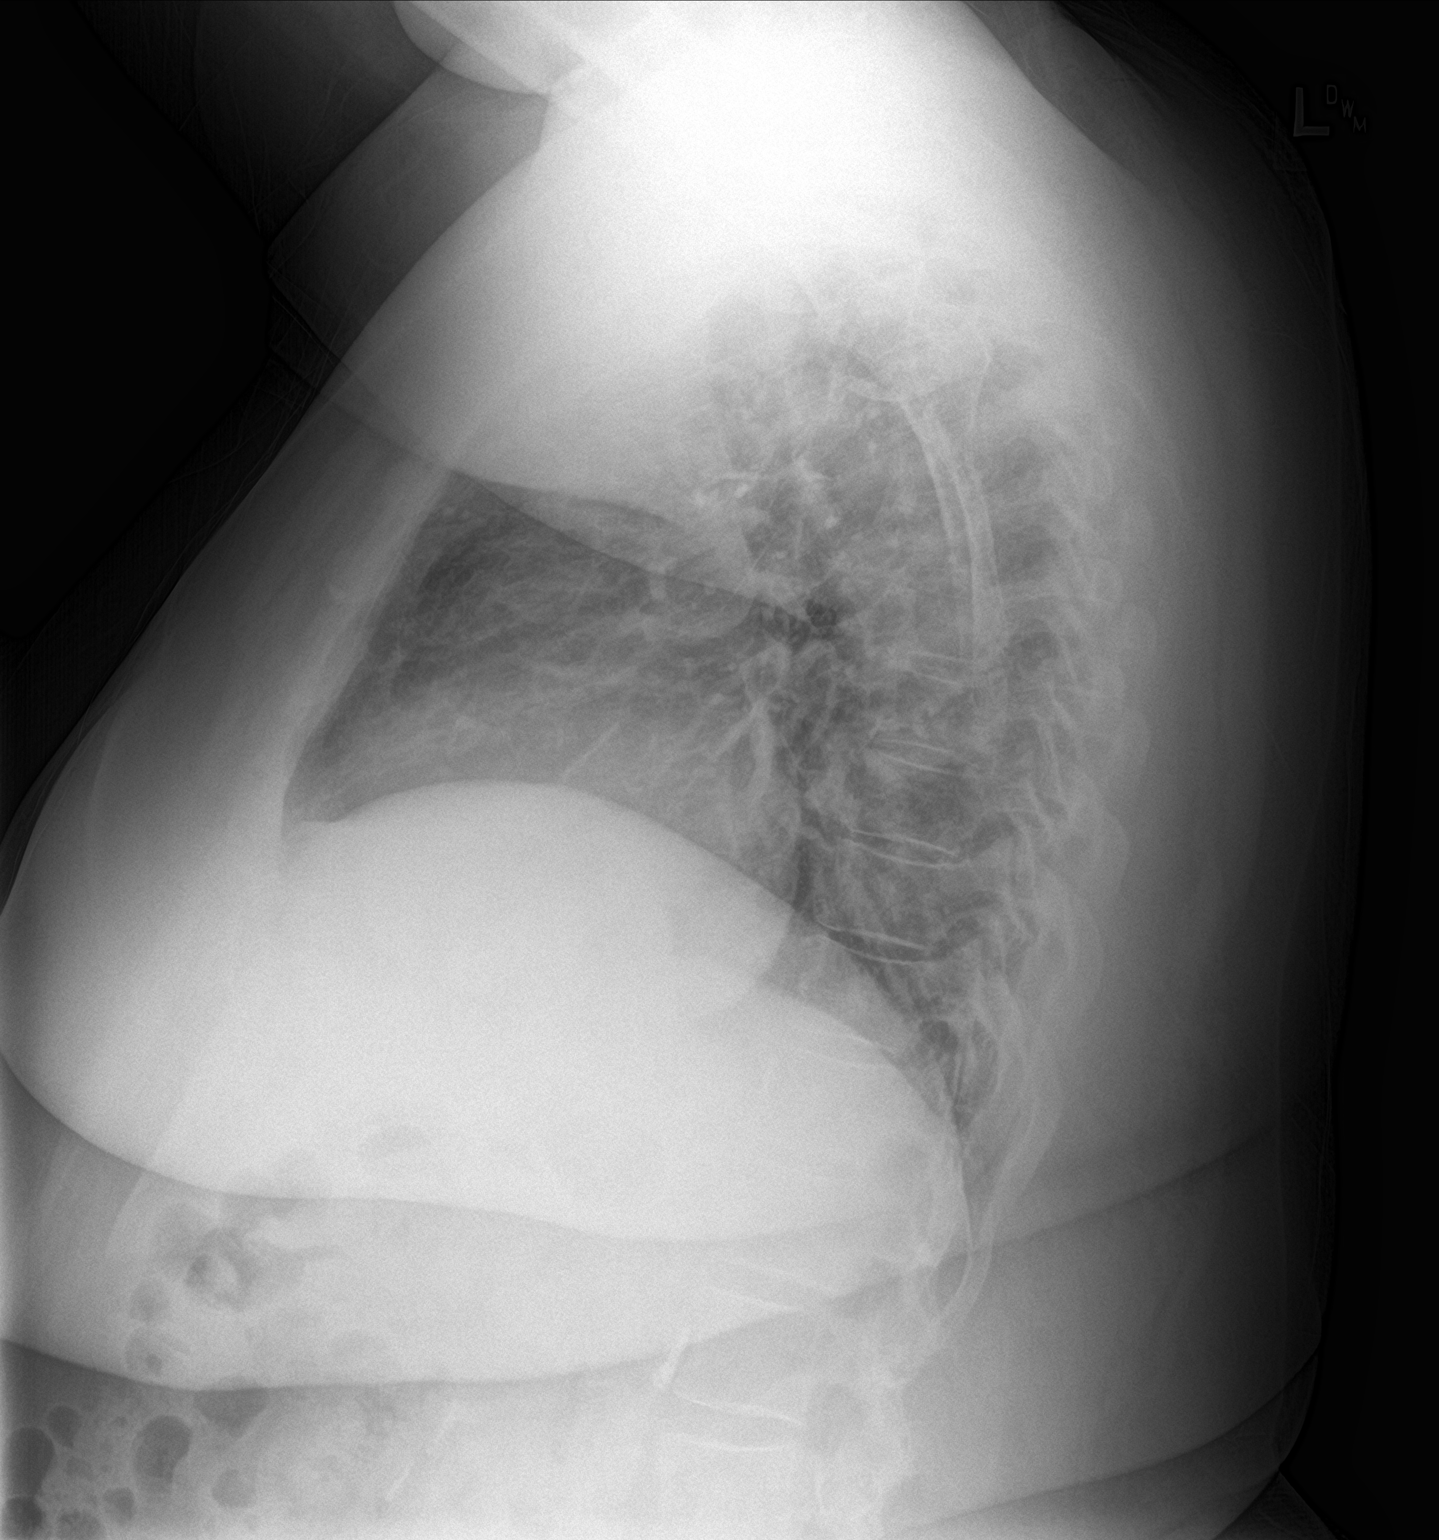

[2 of 2 positions shown; findings below may reference images not displayed]

FINDINGS: Heart size within normal limits. Atherosclerotic aorta without
aneurysm. Vascularity normal. Lungs are clear without infiltrate
effusion or mass.
IMPRESSION: No active cardiopulmonary disease.

## 2018-01-11 ENCOUNTER — Encounter: Payer: Self-pay | Admitting: Family Medicine

## 2018-01-11 ENCOUNTER — Ambulatory Visit (INDEPENDENT_AMBULATORY_CARE_PROVIDER_SITE_OTHER): Payer: Medicare HMO | Admitting: Family Medicine

## 2018-01-11 VITALS — BP 132/64 | HR 63 | Ht 61.0 in | Wt 250.0 lb

## 2018-01-11 DIAGNOSIS — J40 Bronchitis, not specified as acute or chronic: Secondary | ICD-10-CM | POA: Diagnosis not present

## 2018-01-11 DIAGNOSIS — J329 Chronic sinusitis, unspecified: Secondary | ICD-10-CM

## 2018-01-11 MED ORDER — CEFDINIR 300 MG PO CAPS: 300.0000 mg | ORAL_CAPSULE | Freq: Two times a day (BID) | ORAL | 0 refills | Status: DC

## 2018-01-11 MED ORDER — ALBUTEROL SULFATE HFA 108 (90 BASE) MCG/ACT IN AERS: 2.0000 | INHALATION_SPRAY | Freq: Four times a day (QID) | RESPIRATORY_TRACT | 2 refills | Status: DC | PRN

## 2018-01-11 NOTE — Progress Notes (Signed)
   Subjective:    Patient ID: Sharon Reilly, female    DOB: Jan 19, 1956, 62 y.o.   MRN: [id number]  HPI 62 year old female comes in today complaining of sinus symptoms x 2 wks pt reports that her whole face hurts, she stated that her chest feels like it is full of cold and she can't get it up. she denies f/s/c, she said that she was coughing up some yellow mucous now it is thick white. she is not currently taking any OTC medications only cough drops.  He has been using hydrocodone cough syrup the last couple days and says that actually has been really helping.  She also used an old albuterol inhaler and says that was helpful as well though it has expired.  No fevers, chills or sweats.  She has a lot of pressure in her ears which she also feels is from coughing.    Review of Systems     Objective:   Physical Exam  Constitutional: She is oriented to person, place, and time. She appears well-developed and well-nourished.  HENT:  Head: Normocephalic and atraumatic.  Right Ear: External ear normal.  Left Ear: External ear normal.  Nose: Nose normal.  Mouth/Throat: Oropharynx is clear and moist.  TMs and canals are clear.   Eyes: Pupils are equal, round, and reactive to light. Conjunctivae and EOM are normal.  Neck: Neck supple. No thyromegaly present.  Cardiovascular: Normal rate, regular rhythm and normal heart sounds.  Pulmonary/Chest: Effort normal and breath sounds normal. She has no wheezes.  Lymphadenopathy:    She has no cervical adenopathy.  Neurological: She is alert and oriented to person, place, and time.  Skin: Skin is warm and dry.  Psychiatric: She has a normal mood and affect.          Assessment & Plan:  Sinobronchitis -though I feel like a lot of her cough is from postnasal drip from her sinuses.  At this point she is had symptoms for 2 weeks.  We will go ahead and treat with Omnicef based on allergies.  Did send over a new albuterol inhaler.  She still has some cough  syrup at home that she can use.  If not better in 1 week then please give [place] call back.

## 2018-01-11 NOTE — Patient Instructions (Signed)

## 2018-01-30 ENCOUNTER — Other Ambulatory Visit: Payer: Self-pay | Admitting: Family Medicine

## 2018-02-19 ENCOUNTER — Other Ambulatory Visit: Payer: Self-pay | Admitting: Family Medicine

## 2018-03-08 ENCOUNTER — Other Ambulatory Visit: Payer: Self-pay | Admitting: Family Medicine

## 2018-03-16 DIAGNOSIS — M150 Primary generalized (osteo)arthritis: Secondary | ICD-10-CM | POA: Diagnosis not present

## 2018-03-16 DIAGNOSIS — M797 Fibromyalgia: Secondary | ICD-10-CM | POA: Diagnosis not present

## 2018-04-12 DIAGNOSIS — G4733 Obstructive sleep apnea (adult) (pediatric): Secondary | ICD-10-CM | POA: Diagnosis not present

## 2018-04-17 ENCOUNTER — Other Ambulatory Visit: Payer: Self-pay | Admitting: Family Medicine

## 2018-06-14 ENCOUNTER — Other Ambulatory Visit: Payer: Self-pay | Admitting: Family Medicine

## 2018-06-14 ENCOUNTER — Encounter: Payer: Self-pay | Admitting: Family Medicine

## 2018-06-14 ENCOUNTER — Ambulatory Visit (INDEPENDENT_AMBULATORY_CARE_PROVIDER_SITE_OTHER): Payer: Medicare HMO | Admitting: Family Medicine

## 2018-06-14 VITALS — Ht 61.0 in

## 2018-06-14 DIAGNOSIS — I10 Essential (primary) hypertension: Secondary | ICD-10-CM | POA: Diagnosis not present

## 2018-06-14 DIAGNOSIS — N183 Chronic kidney disease, stage 3 unspecified: Secondary | ICD-10-CM

## 2018-06-14 DIAGNOSIS — B351 Tinea unguium: Secondary | ICD-10-CM

## 2018-06-14 DIAGNOSIS — Z13220 Encounter for screening for lipoid disorders: Secondary | ICD-10-CM | POA: Diagnosis not present

## 2018-06-14 DIAGNOSIS — L309 Dermatitis, unspecified: Secondary | ICD-10-CM | POA: Insufficient documentation

## 2018-06-14 DIAGNOSIS — R7301 Impaired fasting glucose: Secondary | ICD-10-CM | POA: Diagnosis not present

## 2018-06-14 MED ORDER — TERBINAFINE HCL 250 MG PO TABS: 250.0000 mg | ORAL_TABLET | Freq: Every day | ORAL | 3 refills | Status: DC

## 2018-06-14 MED ORDER — TRIAMCINOLONE ACETONIDE 0.5 % EX CREA: 1.0000 "application " | TOPICAL_CREAM | Freq: Every day | CUTANEOUS | 0 refills | Status: DC | PRN

## 2018-06-14 NOTE — Progress Notes (Signed)
Virtual Visit via Video Note  I connected with Sharon Reilly on 06/14/18 at  2:20 PM EDT by a video enabled telemedicine application and verified that I am speaking with the correct person using two identifiers.   I discussed the limitations of evaluation and management by telemedicine and the availability of in person appointments. The patient expressed understanding and agreed to proceed.  Pt was at home and I was in my office for the virtual visit.     Subjective:    CC: 6 mo f/u BP  HPI: Hypertension- Pt denies chest pain, SOB, dizziness, or heart palpitations.  Taking meds as directed w/o problems.  Denies medication side effects.  No home Bp cuff.    Impaired fasting glucose-no increased thirst or urination. No symptoms consistent with hypoglycemia.   F/u CKD - no changes to urination. Due for recheck renal function.   Seasonall allergies - has had some nasal congestion. Has nasal spray. Fluticasone.   Wanted to know if she should still be taking the lamisil because of the liver warnings. (would like refill) for her toenail.   And would like a prescription (triamcinolone) for eczema she has an area on her arm that has flared up.   Past medical history, Surgical history, Family history not pertinant except as noted below, Social history, Allergies, and medications have been entered into the medical record, reviewed, and corrections made.   Review of Systems: No fevers, chills, night sweats, weight loss, chest pain, or shortness of breath.   Objective:    General: Speaking clearly in complete sentences without any shortness of breath.  Alert and oriented x3.  Normal judgment. No apparent acute distress. Well groomed.    Impression and Recommendations:    HTN - Well controlled. Continue current regimen. Follow up in  6 months.   IFG - due to recheck A1C. Work on diet, she needs to make some changes.   CKD 3 - due to recheck renal function. Had to cancel her appt with  her kidney doctor. Will check CMP.   Toenail onychomycosis- needs to recheck liver before starts the lamisil again.    Eczema - will refill triamcinilone cream.        I discussed the assessment and treatment plan with the patient. The patient was provided an opportunity to ask questions and all were answered. The patient agreed with the plan and demonstrated an understanding of the instructions.   The patient was advised to call back or seek an in-person evaluation if the symptoms worsen or if the condition fails to improve as anticipated.   Beatrice Lecher, MD

## 2018-06-14 NOTE — Telephone Encounter (Signed)
Patient scheduled.

## 2018-06-14 NOTE — Telephone Encounter (Signed)
lvm asking pt to rtn call to schedule a virtual OV for f/u for medication refills on bp meds.Maryruth Eve, [name], CMA

## 2018-06-14 NOTE — Progress Notes (Signed)
Wanted to know if she should still be taking the lamisil because of the liver warnings. (would like refill)  And would like a prescription (triamcinolone) for eczema she has an area on her arm that has flared up. Maryruth Eve, [name], CMA

## 2018-07-06 DIAGNOSIS — E0590 Thyrotoxicosis, unspecified without thyrotoxic crisis or storm: Secondary | ICD-10-CM | POA: Diagnosis not present

## 2018-07-12 DIAGNOSIS — E0590 Thyrotoxicosis, unspecified without thyrotoxic crisis or storm: Secondary | ICD-10-CM | POA: Diagnosis not present

## 2018-07-14 DIAGNOSIS — D631 Anemia in chronic kidney disease: Secondary | ICD-10-CM | POA: Diagnosis not present

## 2018-07-14 DIAGNOSIS — N183 Chronic kidney disease, stage 3 (moderate): Secondary | ICD-10-CM | POA: Diagnosis not present

## 2018-07-14 DIAGNOSIS — Z6841 Body Mass Index (BMI) 40.0 and over, adult: Secondary | ICD-10-CM | POA: Diagnosis not present

## 2018-07-14 DIAGNOSIS — M329 Systemic lupus erythematosus, unspecified: Secondary | ICD-10-CM | POA: Diagnosis not present

## 2018-07-16 LAB — CBC AND DIFFERENTIAL
HCT: 34 — AB (ref 36–46)
Hemoglobin: 11.4 — AB (ref 12.0–16.0)
WBC: 4.2

## 2018-07-16 LAB — IRON,TIBC AND FERRITIN PANEL
Ferritin: 104
Iron: 58
TIBC: 273

## 2018-07-16 LAB — BASIC METABOLIC PANEL
BUN: 93 — AB (ref 4–21)
Creatinine: 1.3 — AB (ref 0.5–1.1)
Potassium: 4.3 (ref 3.4–5.3)
Sodium: 145 (ref 137–147)

## 2018-07-20 ENCOUNTER — Other Ambulatory Visit: Payer: Self-pay | Admitting: Family Medicine

## 2018-08-01 DIAGNOSIS — N183 Chronic kidney disease, stage 3 (moderate): Secondary | ICD-10-CM | POA: Diagnosis not present

## 2018-08-01 DIAGNOSIS — Z13220 Encounter for screening for lipoid disorders: Secondary | ICD-10-CM | POA: Diagnosis not present

## 2018-08-01 DIAGNOSIS — I10 Essential (primary) hypertension: Secondary | ICD-10-CM | POA: Diagnosis not present

## 2018-08-01 DIAGNOSIS — R7301 Impaired fasting glucose: Secondary | ICD-10-CM | POA: Diagnosis not present

## 2018-08-02 LAB — LIPID PANEL
Cholesterol: 177 mg/dL (ref <200)
HDL: 41 mg/dL — ABNORMAL LOW (ref >=50)
LDL Cholesterol (Calc): 110 mg/dL (calc) — ABNORMAL HIGH
Non-HDL Cholesterol (Calc): 136 mg/dL (calc) — ABNORMAL HIGH (ref <130)
Total CHOL/HDL Ratio: 4.3 (calc) (ref <5.0)
Triglycerides: 138 mg/dL (ref <150)

## 2018-08-02 LAB — COMPLETE METABOLIC PANEL WITH GFR
AG Ratio: 1.4 (calc) (ref 1.0–2.5)
ALT: 22 U/L (ref 6–29)
AST: 18 U/L (ref 10–35)
Albumin: 3.9 g/dL (ref 3.6–5.1)
Alkaline phosphatase (APISO): 91 U/L (ref 37–153)
BUN/Creatinine Ratio: 18 (calc) (ref 6–22)
BUN: 24 mg/dL (ref 7–25)
CO2: 28 mmol/L (ref 20–32)
Calcium: 9.7 mg/dL (ref 8.6–10.4)
Chloride: 106 mmol/L (ref 98–110)
Creat: 1.32 mg/dL — ABNORMAL HIGH (ref 0.50–0.99)
GFR, Est African American: 50 mL/min/{1.73_m2} — ABNORMAL LOW (ref >=60)
GFR, Est Non African American: 43 mL/min/{1.73_m2} — ABNORMAL LOW (ref >=60)
Globulin: 2.8 g/dL (calc) (ref 1.9–3.7)
Glucose, Bld: 87 mg/dL (ref 65–99)
Potassium: 3.7 mmol/L (ref 3.5–5.3)
Sodium: 142 mmol/L (ref 135–146)
Total Bilirubin: 0.4 mg/dL (ref 0.2–1.2)
Total Protein: 6.7 g/dL (ref 6.1–8.1)

## 2018-08-02 LAB — HEMOGLOBIN A1C
Hgb A1c MFr Bld: 5.9 % of total Hgb — ABNORMAL HIGH (ref <5.7)
Mean Plasma Glucose: 123 (calc)
eAG (mmol/L): 6.8 (calc)

## 2018-08-18 ENCOUNTER — Other Ambulatory Visit: Payer: Self-pay | Admitting: Family Medicine

## 2018-08-26 ENCOUNTER — Encounter: Payer: Self-pay | Admitting: Family Medicine

## 2018-08-26 LAB — LAB REPORT - SCANNED
Albumin: 4.2
CO2: 29
Calcium: 9.5
Chloride: 106
EGFR (Non-African Amer.): 51
MCV: 93.2 (ref 76–111)
Phosphorus: 3.4
Protein: 11.9
RBC: 3.67 — AB (ref 3.87–5.11)

## 2018-08-29 DIAGNOSIS — H524 Presbyopia: Secondary | ICD-10-CM | POA: Diagnosis not present

## 2018-08-29 DIAGNOSIS — H5213 Myopia, bilateral: Secondary | ICD-10-CM | POA: Diagnosis not present

## 2018-08-29 DIAGNOSIS — H25813 Combined forms of age-related cataract, bilateral: Secondary | ICD-10-CM | POA: Diagnosis not present

## 2018-10-08 ENCOUNTER — Other Ambulatory Visit: Payer: Self-pay | Admitting: Family Medicine

## 2018-10-27 ENCOUNTER — Other Ambulatory Visit: Payer: Self-pay | Admitting: Family Medicine

## 2018-11-12 ENCOUNTER — Other Ambulatory Visit: Payer: Self-pay | Admitting: Family Medicine

## 2018-11-16 NOTE — Progress Notes (Signed)
Subjective:   Sharon Reilly is a 63 y.o. female who presents for Medicare Annual (Subsequent) preventive examination.  Review of Systems:  No ROS.  Medicare Wellness Virtual Visit.  Visual/audio telehealth visit, UTA vital signs.   See social history for additional risk factors.    Cardiac Risk Factors include: advanced age (>78men, >29 women);sedentary lifestyle Sleep patterns: getting 5-6 hours of sleep a night. Wakes up 1 time to void during the night. Wakes up and feels tired and sluggish. With CPAP does get a good night sleep.   Home Safety/Smoke Alarms: Feels safe in home. Smoke alarms in place.  Living environment; Lives alone in apartment and no steps in or around the apartment has elevator. Shower is a step over tub combo and has a grab bar and slip mat in place. Seat Belt Safety/Bike Helmet: Wears seat belt.   Female:   Pap- Aged out      Mammo-  UTD     Dexa scan- Not eligible due to age       81- UTD      Objective:     Vitals: BP (!) 134/59   Pulse 60   Temp 97.8 F (36.6 C) (Oral)   Ht 5' 1.5" (1.562 m)   Wt 256 lb (116.1 kg)   SpO2 100%   BMI 47.59 kg/m   Body mass index is 47.59 kg/m.  Advanced Directives 11/21/2018 11/16/2017 02/17/2017 03/23/2016 09/27/2013 05/31/2013  Does Patient Have a Medical Advance Directive? No No No (No Data) No Patient does not have advance directive;Patient would like information  Would patient like information on creating a medical advance directive? No - Patient declined No - Patient declined No - Patient declined - No - patient declined information Advance directive packet given    Tobacco Social History   Tobacco Use  Smoking Status Never Smoker  Smokeless Tobacco Never Used     Counseling given: Not Answered   Clinical Intake:                       Past Medical History:  Diagnosis Date  . Fibromyalgia   . Hyperlipidemia   . Hypertension   . Lupus ([place])   . Lupus (systemic lupus erythematosus)  ([place])   . NSTEMI (non-ST elevated myocardial infarction) ([place]) 03/08/2016   [facility]  . Obesity   . Osteoarthritis   . Osteoporosis    Past Surgical History:  Procedure Laterality Date  . ABDOMINAL HYSTERECTOMY     partial  . BREAST SURGERY     reduction  . CARPAL TUNNEL RELEASE     left  . CHOLECYSTECTOMY Bilateral 05/14/2016  . epicondylitis     left  . fistulotomy    . great toe fusion     left  . MRI syr ortho spec     Dr. Christen Butter  . REDUCTION MAMMAPLASTY    . right elbow    . surgisis biodesign fistula plug    . talonavicular joint fusion     LT foot   Family History  Problem Relation Age of Onset  . Breast cancer Mother        Diagnosed in her 7's.  [name] Hyperlipidemia Mother   . Hypertension Mother   . Hyperlipidemia Father   . Diabetes Brother   . Diabetes Other        aunt  . Kidney failure Brother   . Thyroid disease Sister    Social History   Socioeconomic  History  . Marital status: Single    Spouse name: Not on file  . Number of children: 1  . Years of education: 2 years college  . Highest education level: 12th grade  Occupational History  . Occupation: disabled    Comment: insurance  Social Needs  . Financial resource strain: Not hard at all  . Food insecurity    Worry: Never true    Inability: Never true  . Transportation needs    Medical: No    Non-medical: No  Tobacco Use  . Smoking status: Never Smoker  . Smokeless tobacco: Never Used  Substance and Sexual Activity  . Alcohol use: No  . Drug use: No  . Sexual activity: Never  Lifestyle  . Physical activity    Days per week: 0 days    Minutes per session: 0 min  . Stress: Not at all  Relationships  . Social connections    Talks on phone: More than three times a week    Gets together: Once a week    Attends religious service: More than 4 times per year    Active member of club or organization: No    Attends meetings of clubs or organizations: Never    Relationship status:  Patient refused  Other Topics Concern  . Not on file  Social History Narrative   Lives in apartment at a senior living area. Socializes everday.    Outpatient Encounter Medications as of 11/21/2018  Medication Sig  . acetaminophen (TYLENOL) 325 MG tablet Take 650 mg by mouth.  [name] albuterol (PROVENTIL HFA;VENTOLIN HFA) 108 (90 Base) MCG/ACT inhaler Inhale 2 puffs into the lungs every 6 (six) hours as needed for wheezing or shortness of breath.  . AMBULATORY NON FORMULARY MEDICATION Medication Name: Call Huey Romans to get a download off her CPAP and fax to our office.  [phone]  . aspirin 81 MG tablet Take 81 mg by mouth daily.    [name] CALCIUM-VITAMIN D PO Take by mouth.  . fexofenadine (ALLEGRA) 180 MG tablet Take 1 tablet (180 mg total) by mouth daily.  . fluticasone (FLOVENT HFA) 110 MCG/ACT inhaler Inhale 1 puff into the lungs daily as needed.  . gabapentin (NEURONTIN) 100 MG capsule Take 200 mg by mouth daily.  . hydrochlorothiazide (HYDRODIURIL) 25 MG tablet TAKE ONE TABLET BY MOUTH EVERY DAY  . losartan (COZAAR) 100 MG tablet TAKE ONE TABLET BY MOUTH EVERY DAY  . metoprolol tartrate (LOPRESSOR) 100 MG tablet TAKE ONE TABLET BY MOUTH TWICE DAILY  . omeprazole (PRILOSEC) 40 MG capsule Take 1 capsule (40 mg total) by mouth 2 (two) times daily.  . rosuvastatin (CRESTOR) 10 MG tablet Take 1 tablet (10 mg total) by mouth daily.  [name] terbinafine (LAMISIL) 250 MG tablet Take 1 tablet (250 mg total) by mouth daily.  [name] triamcinolone cream (KENALOG) 0.5 % Apply 1 application topically daily as needed.  . hydrocortisone (ANUSOL-HC) 2.5 % rectal cream Place 1 application rectally 2 (two) times daily. (Patient not taking: Reported on 11/21/2018)  . [DISCONTINUED] Vitamin D, Ergocalciferol, (DRISDOL) 50000 units CAPS capsule    No facility-administered encounter medications on file as of 11/21/2018.     Activities of Daily Living In your present state of health, do you have any difficulty performing the  following activities: 11/21/2018  Hearing? N  Vision? N  Walking or climbing stairs? Y  Comment walks with a cane  Dressing or bathing? N  Doing errands, shopping? N  Preparing Food and eating ?  N  Using the Toilet? N  In the past six months, have you accidently leaked urine? Y  Comment when has to go has to get there faast  Do you have problems with loss of bowel control? N  Managing your Medications? N  Managing your Finances? N  Housekeeping or managing your Housekeeping? N  Some recent data might be hidden    Patient Care Team: Hali Marry, MD as PCP - General (Family Medicine)    Assessment:   This is a routine wellness examination for Gary.Physical assessment deferred to PCP.   Exercise Activities and Dietary recommendations Current Exercise Habits: The patient does not participate in regular exercise at present, Exercise limited by: orthopedic condition(s) Diet Eats a diet of vegetables everyday. Breakfast: skips Lunch: chicken salad sandwich Dinner: Meat and vegetables, spinach broccoli, potato      Goals    . Exercise 150 min/wk Moderate Activity     Once back pain is resolved and feet pain is better will start back exercising and walking. Was walking 1 mile before this started happening.    . Exercise 3x per week (30 min per time) (pt-stated)     She would like to participate in a structured exercise program such as Silver Sneakers or water aerobics a few times per week at the [facility].    [name] Exercise 3x per week (30 min per time)     Would like to get on track and try and walk some this year.       Fall Risk Fall Risk  11/21/2018 11/16/2017 03/23/2016  Falls in the past year? 0 No No  Number falls in past yr: 0 - -  Injury with Fall? 0 - -  Risk for fall due to : - Impaired balance/gait Impaired balance/gait;Impaired mobility  Follow up Falls prevention discussed - -   Is the patient's home free of loose throw rugs in walkways, pet beds, electrical  cords, etc?   yes      Grab bars in the bathroom? yes      Handrails on the stairs?   no      Adequate lighting?   yes   Depression Screen PHQ 2/9 Scores 11/21/2018 06/14/2018 01/11/2018 11/16/2017  PHQ - 2 Score 0 0 3 0  PHQ- 9 Score - - 7 -     Cognitive Function     6CIT Screen 11/21/2018 11/16/2017 03/23/2016  What Year? 0 points 0 points 0 points  What month? 0 points 0 points 0 points  What time? 0 points 0 points 0 points  Count back from 20 0 points 0 points -  Months in reverse 0 points 0 points 0 points  Repeat phrase 0 points 0 points 0 points  Total Score 0 0 -    Immunization History  Administered Date(s) Administered  . Influenza Split 12/24/2010  . Influenza, High Dose Seasonal PF 11/16/2017  . Influenza,inj,Quad PF,6+ Mos 11/29/2012, 10/04/2013, 11/16/2014, 11/21/2015, 10/13/2016  . Td 07/04/2010    Screening Tests Health Maintenance  Topic Date Due  . Hepatitis C Screening  20-Jul-1955  . HIV Screening  07/06/1970  . MAMMOGRAM  12/10/2019  . TETANUS/TDAP  07/03/2020  . COLONOSCOPY  10/09/2020  . INFLUENZA VACCINE  Completed        Plan:      Ms. Clendenen , Thank you for taking time to come for your Medicare Wellness Visit. I appreciate your ongoing commitment to your health goals. Please review the following plan  we discussed and let me know if I can assist you in the future.  Please schedule your next medicare wellness visit with me in 1 yr. Hepatitis C screening ordered and go to lab and have done.  These are the goals we discussed: Goals    . Exercise 150 min/wk Moderate Activity     Once back pain is resolved and feet pain is better will start back exercising and walking. Was walking 1 mile before this started happening.    . Exercise 3x per week (30 min per time) (pt-stated)     She would like to participate in a structured exercise program such as Silver Sneakers or water aerobics a few times per week at the [facility]    [name] Exercise 3x per week (30  min per time)     Would like to get on track and try and walk some this year.       This is a list of the screening recommended for you and due dates:  Health Maintenance  Topic Date Due  .  Hepatitis C: One time screening is recommended by Center for Disease Control  (CDC) for  adults born from [birth year] through 1965.   23-Nov-1955  . HIV Screening  07/06/1970  . Mammogram  12/10/2019  . Tetanus Vaccine  07/03/2020  . Colon Cancer Screening  10/09/2020  . Flu Shot  Completed      I have personally reviewed and noted the following in the patient's chart:   . Medical and social history . Use of alcohol, tobacco or illicit drugs  . Current medications and supplements . Functional ability and status . Nutritional status . Physical activity . Advanced directives . List of other physicians . Hospitalizations, surgeries, and ER visits in previous 12 months . Vitals . Screenings to include cognitive, depression, and falls . Referrals and appointments  In addition, I have reviewed and discussed with patient certain preventive protocols, quality metrics, and best practice recommendations. A written personalized care plan for preventive services as well as general preventive health recommendations were provided to patient.     Joanne Chars, LPN  [id number]

## 2018-11-21 ENCOUNTER — Ambulatory Visit (INDEPENDENT_AMBULATORY_CARE_PROVIDER_SITE_OTHER): Payer: Medicare HMO | Admitting: *Deleted

## 2018-11-21 ENCOUNTER — Other Ambulatory Visit: Payer: Self-pay | Admitting: Family Medicine

## 2018-11-21 ENCOUNTER — Other Ambulatory Visit: Payer: Self-pay

## 2018-11-21 VITALS — BP 134/59 | HR 60 | Temp 97.8°F | Ht 61.5 in | Wt 256.0 lb

## 2018-11-21 DIAGNOSIS — Z1159 Encounter for screening for other viral diseases: Secondary | ICD-10-CM

## 2018-11-21 DIAGNOSIS — Z9189 Other specified personal risk factors, not elsewhere classified: Secondary | ICD-10-CM

## 2018-11-21 DIAGNOSIS — Z1231 Encounter for screening mammogram for malignant neoplasm of breast: Secondary | ICD-10-CM

## 2018-11-21 DIAGNOSIS — Z0000 Encounter for general adult medical examination without abnormal findings: Secondary | ICD-10-CM | POA: Diagnosis not present

## 2018-11-21 DIAGNOSIS — Z23 Encounter for immunization: Secondary | ICD-10-CM

## 2018-11-21 NOTE — Patient Instructions (Signed)
Ms. Bohley , Thank you for taking time to come for your Medicare Wellness Visit. I appreciate your ongoing commitment to your health goals. Please review the following plan we discussed and let me know if I can assist you in the future.  Please schedule your next medicare wellness visit with me in 1 yr. Hepatitis C screening ordered and go to lab and have done. These are the goals we discussed: Goals    . Exercise 150 min/wk Moderate Activity     Once back pain is resolved and feet pain is better will start back exercising and walking. Was walking 1 mile before this started happening.    . Exercise 3x per week (30 min per time) (pt-stated)     She would like to participate in a structured exercise program such as Silver Sneakers or water aerobics a few times per week at the [facility].    [name] Exercise 3x per week (30 min per time)     Would like to get on track and try and walk some this year.

## 2018-11-22 LAB — HEPATITIS C ANTIBODY
Hepatitis C Ab: NONREACTIVE
SIGNAL TO CUT-OFF: 0.06 (ref <1.00)

## 2018-12-14 ENCOUNTER — Other Ambulatory Visit: Payer: Self-pay

## 2018-12-14 ENCOUNTER — Ambulatory Visit (INDEPENDENT_AMBULATORY_CARE_PROVIDER_SITE_OTHER): Payer: Medicare HMO | Admitting: Family Medicine

## 2018-12-14 ENCOUNTER — Ambulatory Visit (INDEPENDENT_AMBULATORY_CARE_PROVIDER_SITE_OTHER): Payer: Medicare HMO

## 2018-12-14 ENCOUNTER — Encounter: Payer: Self-pay | Admitting: Family Medicine

## 2018-12-14 VITALS — BP 132/78 | HR 59 | Ht 62.0 in | Wt 255.0 lb

## 2018-12-14 DIAGNOSIS — I10 Essential (primary) hypertension: Secondary | ICD-10-CM

## 2018-12-14 DIAGNOSIS — B351 Tinea unguium: Secondary | ICD-10-CM | POA: Diagnosis not present

## 2018-12-14 DIAGNOSIS — M2021 Hallux rigidus, right foot: Secondary | ICD-10-CM

## 2018-12-14 DIAGNOSIS — M79674 Pain in right toe(s): Secondary | ICD-10-CM | POA: Diagnosis not present

## 2018-12-14 DIAGNOSIS — N1831 Chronic kidney disease, stage 3a: Secondary | ICD-10-CM | POA: Diagnosis not present

## 2018-12-14 DIAGNOSIS — Z1231 Encounter for screening mammogram for malignant neoplasm of breast: Secondary | ICD-10-CM | POA: Diagnosis not present

## 2018-12-14 DIAGNOSIS — N183 Chronic kidney disease, stage 3 unspecified: Secondary | ICD-10-CM | POA: Diagnosis not present

## 2018-12-14 DIAGNOSIS — R7301 Impaired fasting glucose: Secondary | ICD-10-CM

## 2018-12-14 DIAGNOSIS — Z6841 Body Mass Index (BMI) 40.0 and over, adult: Secondary | ICD-10-CM | POA: Diagnosis not present

## 2018-12-14 MED ORDER — TERBINAFINE HCL 250 MG PO TABS: 250.0000 mg | ORAL_TABLET | Freq: Every day | ORAL | 0 refills | Status: DC

## 2018-12-14 MED ORDER — LOSARTAN POTASSIUM-HCTZ 100-25 MG PO TABS: 1.0000 | ORAL_TABLET | Freq: Every day | ORAL | 3 refills | Status: DC

## 2018-12-14 NOTE — Assessment & Plan Note (Signed)
Well controlled. Continue current regimen. Follow up in  6 months.  

## 2018-12-14 NOTE — Assessment & Plan Note (Signed)
Well controlled. Continue current regimen. Follow up in  6 mo Lab Results  Component Value Date   HGBA1C 5.9 (H) 08/01/2018

## 2018-12-14 NOTE — Assessment & Plan Note (Signed)
Will continue Lamisil for 3 more months. since has had good results on several nail. Some nails with no change and dicussed these are likely dystrophic and discussed this with her.

## 2018-12-14 NOTE — Assessment & Plan Note (Signed)
Discussed options. Will refer to Podiatry for further work up

## 2018-12-14 NOTE — Progress Notes (Addendum)
Established Patient Office Visit  Subjective:  Patient ID: Sharon Reilly, female    DOB: 1956/02/07  Age: 63 y.o. MRN: [id number]  CC:  Chief Complaint  Patient presents with  . Hypertension    she would like to discuss continuing the lamasil    HPI TENISE ESTEPP presents for   Hypertension- Pt denies chest pain, SOB, dizziness, or heart palpitations.  Taking meds as directed w/o problems.  Denies medication side effects.    Impaired fasting glucose-no increased thirst or urination. No symptoms consistent with hypoglycemia. Lab Results  Component Value Date   HGBA1C 5.9 (H) 12/14/2018      F/U CKD - doing well. Avoids NSAIDs.  No recent changes.  Only uses Tylenol if needed.  She also wanted let me know that rash around her ankles came back.  She said it was there about 2 years ago it did not come back last year but then this year has come back it is mostly on the medial ankle area and they are extremely itchy.  She says some of them actually have a little bit of fluid in them but she has scratched most of them.  She started using the steroid cream for eczema on them and it helps some but they are still intensely itchy.  Also wanted to discuss her toenails today.  Is also been having pain particularly in the right great toe and second toe.  She says that hurts at rest and even at night sometimes even painful to put a sock on it is very sensitive.  Would actually get a uric acid level on her just to rule out gout even though they were not swollen.  That was normal.  Past Medical History:  Diagnosis Date  . Fibromyalgia   . Hyperlipidemia   . Hypertension   . Lupus ([place])   . Lupus (systemic lupus erythematosus) ([place])   . NSTEMI (non-ST elevated myocardial infarction) ([place]) 03/08/2016   [facility]  . Obesity   . Osteoarthritis   . Osteoporosis     Past Surgical History:  Procedure Laterality Date  . ABDOMINAL HYSTERECTOMY     partial  . BREAST SURGERY     reduction  .  CARPAL TUNNEL RELEASE     left  . CHOLECYSTECTOMY Bilateral 05/14/2016  . epicondylitis     left  . fistulotomy    . great toe fusion     left  . MRI syr ortho spec     Dr. Christen Butter  . REDUCTION MAMMAPLASTY    . right elbow    . surgisis biodesign fistula plug    . talonavicular joint fusion     LT foot    Family History  Problem Relation Age of Onset  . Breast cancer Mother        Diagnosed in her 26's.  [name] Hyperlipidemia Mother   . Hypertension Mother   . Hyperlipidemia Father   . Diabetes Brother   . Diabetes Other        aunt  . Kidney failure Brother   . Thyroid disease Sister     Social History   Socioeconomic History  . Marital status: Single    Spouse name: Not on file  . Number of children: 1  . Years of education: 2 years college  . Highest education level: 12th grade  Occupational History  . Occupation: disabled    Comment: insurance  Social Needs  . Financial resource strain: Not hard at all  .  Food insecurity    Worry: Never true    Inability: Never true  . Transportation needs    Medical: No    Non-medical: No  Tobacco Use  . Smoking status: Never Smoker  . Smokeless tobacco: Never Used  Substance and Sexual Activity  . Alcohol use: No  . Drug use: No  . Sexual activity: Never  Lifestyle  . Physical activity    Days per week: 0 days    Minutes per session: 0 min  . Stress: Not at all  Relationships  . Social connections    Talks on phone: More than three times a week    Gets together: Once a week    Attends religious service: More than 4 times per year    Active member of club or organization: No    Attends meetings of clubs or organizations: Never    Relationship status: Patient refused  . Intimate partner violence    Fear of current or ex partner: No    Emotionally abused: No    Physically abused: No    Forced sexual activity: No  Other Topics Concern  . Not on file  Social History Narrative   Lives in apartment at a senior  living area. Socializes everday.    Outpatient Medications Prior to Visit  Medication Sig Dispense Refill  . acetaminophen (TYLENOL) 325 MG tablet Take 650 mg by mouth 2 (two) times daily.     [name] albuterol (PROVENTIL HFA;VENTOLIN HFA) 108 (90 Base) MCG/ACT inhaler Inhale 2 puffs into the lungs every 6 (six) hours as needed for wheezing or shortness of breath. 1 Inhaler 2  . AMBULATORY NON FORMULARY MEDICATION Medication Name: Call Huey Romans to get a download off her CPAP and fax to our office.  [phone] 1 Units 0  . aspirin 81 MG tablet Take 81 mg by mouth daily.      [name] CALCIUM-VITAMIN D PO Take by mouth.    . fexofenadine (ALLEGRA) 180 MG tablet Take 1 tablet (180 mg total) by mouth daily. 30 tablet 6  . fluticasone (FLOVENT HFA) 110 MCG/ACT inhaler Inhale 1 puff into the lungs daily as needed.    . gabapentin (NEURONTIN) 100 MG capsule Take 200 mg by mouth daily.    . metoprolol tartrate (LOPRESSOR) 100 MG tablet TAKE ONE TABLET BY MOUTH TWICE DAILY 180 tablet 1  . omeprazole (PRILOSEC) 40 MG capsule Take 1 capsule (40 mg total) by mouth 2 (two) times daily. 180 capsule 2  . rosuvastatin (CRESTOR) 10 MG tablet Take 1 tablet (10 mg total) by mouth daily. 90 tablet 2  . terbinafine (LAMISIL) 250 MG tablet Take 1 tablet (250 mg total) by mouth daily. 30 tablet 3  . hydrochlorothiazide (HYDRODIURIL) 25 MG tablet TAKE ONE TABLET BY MOUTH EVERY DAY 90 tablet 1  . hydrocortisone (ANUSOL-HC) 2.5 % rectal cream Place 1 application rectally 2 (two) times daily. (Patient not taking: Reported on 11/21/2018) 60 g 0  . losartan (COZAAR) 100 MG tablet TAKE ONE TABLET BY MOUTH EVERY DAY 90 tablet 1  . triamcinolone cream (KENALOG) 0.5 % Apply 1 application topically daily as needed. 45 g 0   No facility-administered medications prior to visit.     Allergies  Allergen Reactions  . Amoxicillin Hives and Itching    No rash.   . Erythromycin Hives  . Nsaids Other (See Comments)    CKD 3   . Sulfonamide  Derivatives   . Doxycycline Hives  . Sulfa Antibiotics Hives and  Rash    ROS Review of Systems    Objective:    Physical Exam  Constitutional: She is oriented to person, place, and time. She appears well-developed and well-nourished.  HENT:  Head: Normocephalic and atraumatic.  Cardiovascular: Normal rate, regular rhythm and normal heart sounds.  Pulmonary/Chest: Effort normal and breath sounds normal.  Neurological: She is alert and oriented to person, place, and time.  Skin: Skin is warm and dry.  Her great toenail looks good on the proximal half end.  The distal end still has brown discoloration.  The second fourth and fifth toenails are still dark and thickened.  The third toenail looks nice and clear.  She also has a rash around her ankles with hyperpigmented papules.  Psychiatric: She has a normal mood and affect. Her behavior is normal.    BP 132/78   Pulse (!) 59   Ht 5\' 2"  (1.575 m)   Wt 255 lb (115.7 kg)   SpO2 95%   BMI 46.64 kg/m  Wt Readings from Last 3 Encounters:  12/14/18 255 lb (115.7 kg)  11/21/18 256 lb (116.1 kg)  01/11/18 250 lb (113.4 kg)     Health Maintenance Due  Topic Date Due  . HIV Screening  07/06/1970    There are no preventive care reminders to display for this patient.  Lab Results  Component Value Date   TSH 3.01 05/22/2015   Lab Results  Component Value Date   WBC 4.2 07/16/2018   HGB 11.4 (A) 07/16/2018   HCT 34 (A) 07/16/2018   MCV 93.2 07/16/2018   PLT 280 01/14/2017   Lab Results  Component Value Date   NA 142 12/14/2018   K 4.1 12/14/2018   CO2 30 12/14/2018   GLUCOSE 93 12/14/2018   BUN 24 12/14/2018   CREATININE 1.30 (H) 12/14/2018   BILITOT 0.4 08/01/2018   ALKPHOS 76 09/29/2016   AST 18 08/01/2018   ALT 22 08/01/2018   PROT 6.7 08/01/2018   ALBUMIN 4.2 07/16/2018   CALCIUM 9.4 12/14/2018   Lab Results  Component Value Date   CHOL 177 08/01/2018   Lab Results  Component Value Date   HDL 41 (L)  08/01/2018   Lab Results  Component Value Date   LDLCALC 110 (H) 08/01/2018   Lab Results  Component Value Date   TRIG 138 08/01/2018   Lab Results  Component Value Date   CHOLHDL 4.3 08/01/2018   Lab Results  Component Value Date   HGBA1C 5.9 (H) 12/14/2018      Assessment & Plan:   Problem List Items Addressed This Visit      Cardiovascular and Mediastinum   Essential hypertension, benign - Primary    Well controlled. Continue current regimen. Follow up in  6 months.       Relevant Medications   losartan-hydrochlorothiazide (HYZAAR) 100-25 MG tablet   Other Relevant Orders   HgB A1c (Completed)   BASIC METABOLIC PANEL WITH GFR (Completed)     Endocrine   IFG (impaired fasting glucose)    Well controlled. Continue current regimen. Follow up in  6 mo Lab Results  Component Value Date   HGBA1C 5.9 (H) 08/01/2018         Relevant Orders   HgB A1c (Completed)   BASIC METABOLIC PANEL WITH GFR (Completed)     Musculoskeletal and Integument   Onychomycosis of toenail    Will continue Lamisil for 3 more months. since has had good results on several nail.  Some nails with no change and dicussed these are likely dystrophic and discussed this with her.       Relevant Medications   terbinafine (LAMISIL) 250 MG tablet     Genitourinary   CKD (chronic kidney disease) stage 3, GFR 30-59 ml/min    Due to recheck renal function. Will get renal US      Relevant Orders   HgB A1c (Completed)   BASIC METABOLIC PANEL WITH GFR (Completed)   US Renal     Other   Hallux rigidus of right foot    Discussed options. Will refer to Podiatry for further work up       Other Visit Diagnoses    Great toe pain, right       Relevant Orders   Ambulatory referral to Podiatry      Meds ordered this encounter  Medications  . losartan-hydrochlorothiazide (HYZAAR) 100-25 MG tablet    Sig: Take 1 tablet by mouth daily.    Dispense:  90 tablet    Refill:  3  . terbinafine  (LAMISIL) 250 MG tablet    Sig: Take 1 tablet (250 mg total) by mouth daily.    Dispense:  90 tablet    Refill:  0    Follow-up: Return in about 6 months (around 06/14/2019) for Hypertension and Pre-diabetes.    Beatrice Lecher, MD

## 2018-12-14 NOTE — Assessment & Plan Note (Addendum)
Due to recheck renal function. Will get renal US

## 2018-12-15 LAB — BASIC METABOLIC PANEL WITH GFR
BUN/Creatinine Ratio: 18 (calc) (ref 6–22)
BUN: 24 mg/dL (ref 7–25)
CO2: 30 mmol/L (ref 20–32)
Calcium: 9.4 mg/dL (ref 8.6–10.4)
Chloride: 105 mmol/L (ref 98–110)
Creat: 1.3 mg/dL — ABNORMAL HIGH (ref 0.50–0.99)
GFR, Est African American: 51 mL/min/{1.73_m2} — ABNORMAL LOW (ref >=60)
GFR, Est Non African American: 44 mL/min/{1.73_m2} — ABNORMAL LOW (ref >=60)
Glucose, Bld: 93 mg/dL (ref 65–99)
Potassium: 4.1 mmol/L (ref 3.5–5.3)
Sodium: 142 mmol/L (ref 135–146)

## 2018-12-15 LAB — HEMOGLOBIN A1C
Hgb A1c MFr Bld: 5.9 % of total Hgb — ABNORMAL HIGH (ref <5.7)
Mean Plasma Glucose: 123 (calc)
eAG (mmol/L): 6.8 (calc)

## 2018-12-15 NOTE — Addendum Note (Signed)
Addended by: Beatrice Lecher D on: 12/15/2018 03:54 PM   Modules accepted: Orders

## 2018-12-21 ENCOUNTER — Telehealth: Payer: Self-pay

## 2018-12-21 NOTE — Telephone Encounter (Signed)
Sharon Reilly called and states she received a call from the imaging department to schedule the ultrasound of the kidneys. She was thought Dr Madilyn Fireman had decided not to do the ultrasound and let Dr. Felicity Pellegrini (Nephrology) follow her levels. Please advise.

## 2018-12-21 NOTE — Telephone Encounter (Signed)
That is correct.  I forgot to go in and canceled the order.  I just did it now so she can disregard the phone call.  Please tell her I was sorry for the inconvenience.

## 2018-12-21 NOTE — Telephone Encounter (Signed)
Patient advised.

## 2018-12-27 DIAGNOSIS — M79671 Pain in right foot: Secondary | ICD-10-CM | POA: Diagnosis not present

## 2018-12-27 DIAGNOSIS — M205X1 Other deformities of toe(s) (acquired), right foot: Secondary | ICD-10-CM | POA: Diagnosis not present

## 2018-12-27 DIAGNOSIS — M19071 Primary osteoarthritis, right ankle and foot: Secondary | ICD-10-CM | POA: Diagnosis not present

## 2018-12-28 DIAGNOSIS — G4733 Obstructive sleep apnea (adult) (pediatric): Secondary | ICD-10-CM | POA: Diagnosis not present

## 2019-02-19 ENCOUNTER — Other Ambulatory Visit: Payer: Self-pay | Admitting: Family Medicine

## 2019-02-27 DIAGNOSIS — M19071 Primary osteoarthritis, right ankle and foot: Secondary | ICD-10-CM | POA: Diagnosis not present

## 2019-02-27 DIAGNOSIS — M205X1 Other deformities of toe(s) (acquired), right foot: Secondary | ICD-10-CM | POA: Diagnosis not present

## 2019-03-02 DIAGNOSIS — M205X1 Other deformities of toe(s) (acquired), right foot: Secondary | ICD-10-CM | POA: Insufficient documentation

## 2019-03-15 ENCOUNTER — Encounter: Payer: Self-pay | Admitting: Physician Assistant

## 2019-03-15 ENCOUNTER — Ambulatory Visit (INDEPENDENT_AMBULATORY_CARE_PROVIDER_SITE_OTHER): Payer: Medicare HMO | Admitting: Physician Assistant

## 2019-03-15 VITALS — BP 128/70 | Ht 62.0 in | Wt 255.0 lb

## 2019-03-15 DIAGNOSIS — H6983 Other specified disorders of Eustachian tube, bilateral: Secondary | ICD-10-CM

## 2019-03-15 DIAGNOSIS — J0100 Acute maxillary sinusitis, unspecified: Secondary | ICD-10-CM | POA: Diagnosis not present

## 2019-03-15 MED ORDER — METHYLPREDNISOLONE 4 MG PO TBPK: ORAL_TABLET | ORAL | 0 refills | Status: DC

## 2019-03-15 NOTE — Progress Notes (Signed)
Ear pressure - has been struggling with sinuses Dizziness - started in the middle of the night Has never had dizziness before No other symptoms

## 2019-03-15 NOTE — Progress Notes (Addendum)
Patient ID: Sharon Reilly, female   DOB: 1955-10-25, 64 y.o.   MRN: [id number] .Marland KitchenVirtual Visit via Video Note  I connected with Sharon Reilly on 03/15/2019 at  1:00 PM EST by a video enabled telemedicine application and verified that I am speaking with the correct person using two identifiers.  Location: Patient: home Provider: clinic   I discussed the limitations of evaluation and management by telemedicine and the availability of in person appointments. The patient expressed understanding and agreed to proceed.  History of Present Illness: Pt is a 64 yo female with allergies and asthma who calls into the clinic with bilateral ear pressure for the last 3 days. She admits to some sinus pressure as well. She is blowing out some stuff but clear. She is on allergra. She woke up dizzy this morning. Her ears are popping. No ear drainage. No fever, chills, SOB, cough, loss of smell or taste, GI symptoms. No wheezing.    .. Active Ambulatory Problems    Diagnosis Date Noted  . Sjogren's syndrome ([place]) 06/26/2010  . Essential hypertension, benign 06/26/2010  . Morbid obesity ([place]) 06/26/2010  . Fibromyalgia 07/10/2010  . Tubular adenoma of colon 07/10/2010  . Anal fistula 07/10/2010  . HYPERLIPIDEMIA 05/20/2010  . OSTEOPOROSIS 05/20/2010  . IFG (impaired fasting glucose) 09/01/2012  . Lactose intolerance 05/31/2013  . OSA (obstructive sleep apnea) 10/04/2013  . Left thyroid nodule 02/01/2014  . Nonpalpable mass of neck 02/01/2014  . Lumbago with sciatica 11/07/2014  . Lymphadenopathy of right cervical region 08/05/2015  . Gastroesophageal reflux disease with esophagitis 08/14/2015  . Internal hemorrhoids 10/14/2015  . CAD (coronary artery disease) 03/09/2016  . NSTEMI (non-ST elevated myocardial infarction) ([place]) 03/08/2016  . Hallux rigidus of right foot 09/29/2016  . Plantar fasciitis of right foot 09/29/2016  . Bilateral knee pain 09/29/2016  . CKD (chronic kidney disease) stage 3,  GFR 30-59 ml/min 04/08/2017  . Acute middle ear effusion, right 04/15/2017  . Eczema 06/14/2018  . Anemia in stage 3 chronic kidney disease 11/18/2016  . Gallstones 05/06/2016  . Gastroesophageal reflux disease 03/31/2016  . Pure hypercholesterolemia 03/31/2016  . Secondary hyperparathyroidism of renal origin ([place]) 01/11/2017  . Onychomycosis of toenail 12/14/2018   Resolved Ambulatory Problems    Diagnosis Date Noted  . Lumbar radiculopathy 07/02/2010  . Lumbar spondylosis 07/03/2010  . Osteoarthritis 07/10/2010  . ACUT SUPPRATV OTITIS MEDIA W/O SPONT RUP EARDRUM 10/06/2010  . Essential hypertension 05/20/2010  . SINUSITIS, MAXILLARY, ACUTE 10/06/2010  . SINUSITIS, ACUTE 05/20/2010  . OSTEOARTHRITIS 05/20/2010  . OPEN WOUND FINGER WITHOUT MENTION COMPLICATION [id number]  . Right ear pain 08/05/2015  . Epigastric fullness 08/14/2015  . Nausea without vomiting 08/14/2015  . Abdominal pain, epigastric 08/14/2015  . Acute bronchitis with eustachian tube dysfunction 03/09/2017  . Post-viral cough syndrome 04/15/2017   Past Medical History:  Diagnosis Date  . Hyperlipidemia   . Hypertension   . Lupus ([place])   . Lupus (systemic lupus erythematosus) ([place])   . Obesity   . Osteoporosis    Reviewed med, allergy, problem list.     Observations/Objective: No acute distress. Normal breathing.  No cough.  Normal mood and appearance.  No tenderness to pull up or down on ears.   .. Today's Vitals   03/15/19 1145  BP: 128/70  Weight: 255 lb (115.7 kg)  Height: 5\' 2"  (1.575 m)   Body mass index is 46.64 kg/m.    Assessment and Plan: Marland KitchenMarland KitchenFredda was seen today for dizziness and  ear problem.  Diagnoses and all orders for this visit:  Acute non-recurrent maxillary sinusitis -     methylPREDNISolone (MEDROL DOSEPAK) 4 MG TBPK tablet; Take as directed by package insert.  ETD (Eustachian tube dysfunction), bilateral -     methylPREDNISolone (MEDROL DOSEPAK) 4 MG TBPK tablet;  Take as directed by package insert.   I do not see any bacterial signs of sinusitis at this time. Medrol dose pack given to start. Consider OTC flonase. Stay hydrated. Discussed ongoing sinusitis symptoms consider abx. Follow up as needed.   Spent 8 minutes with patient and in chart review.   Follow Up Instructions:    I discussed the assessment and treatment plan with the patient. The patient was provided an opportunity to ask questions and all were answered. The patient agreed with the plan and demonstrated an understanding of the instructions.   The patient was advised to call back or seek an in-person evaluation if the symptoms worsen or if the condition fails to improve as anticipated.     Iran Planas, PA-C

## 2019-03-17 ENCOUNTER — Encounter: Payer: Self-pay | Admitting: Physician Assistant

## 2019-04-06 ENCOUNTER — Telehealth (INDEPENDENT_AMBULATORY_CARE_PROVIDER_SITE_OTHER): Payer: Medicare HMO | Admitting: Family Medicine

## 2019-04-06 ENCOUNTER — Encounter: Payer: Self-pay | Admitting: Family Medicine

## 2019-04-06 DIAGNOSIS — R42 Dizziness and giddiness: Secondary | ICD-10-CM | POA: Diagnosis not present

## 2019-04-06 DIAGNOSIS — J019 Acute sinusitis, unspecified: Secondary | ICD-10-CM

## 2019-04-06 DIAGNOSIS — R1905 Periumbilic swelling, mass or lump: Secondary | ICD-10-CM | POA: Diagnosis not present

## 2019-04-06 MED ORDER — CEPHALEXIN 500 MG PO CAPS: 500.0000 mg | ORAL_CAPSULE | Freq: Three times a day (TID) | ORAL | 0 refills | Status: DC

## 2019-04-06 NOTE — Progress Notes (Signed)
Virtual Visit via Video Note  I connected with Sharon Reilly on 04/06/19 at  2:40 PM EST by a video enabled telemedicine application and verified that I am speaking with the correct person using two identifiers.   I discussed the limitations of evaluation and management by telemedicine and the availability of in person appointments. The patient expressed understanding and agreed to proceed.  Subjective:    CC: Dizziness  HPI: 64 yo female c/o of dizziness.  She was seen on 1/27 for bilateral ear pressure for about 3 days and sinus pressure with clear nasal congestion. She woke up that day feeling dizzy.  She was already taking Allegra.  She was given prednisone for sinusitis and possible Eustachian tube dysfunction. Ears still feel plugged up.  Popping some. She is using the flonase. No more nasal drainage.  She feels like the CPAP make her very congested esp in her chest.  Still on her omeprazole  And no GERD sxs.  Pain is going into her neck.  Mild cough. No ST.  Can't tell her her LN are swollen.  Has a lot of pressure in her face still.    Also c/o of knot in her  Abdomen near her umbilicus that will swell at times. Notices it more when sitting for long periods or if the waistband of her pants is pressing on the area.  Not sore to touch.  Occ feels like a dull ache.  Notices if after having her gallbladder out.    Past medical history, Surgical history, Family history not pertinant except as noted below, Social history, Allergies, and medications have been entered into the medical record, reviewed, and corrections made.   Review of Systems: No fevers, chills, night sweats, weight loss, chest pain, or shortness of breath.   Objective:    General: Speaking clearly in complete sentences without any shortness of breath.  Alert and oriented x3.  Normal judgment. No apparent acute distress.    Impression and Recommendations:    No problem-specific Assessment & Plan notes found for this  encounter.  Sinusitis with ETD - persistent facial pressure, ear pressure and pain radiation into her neck. She did get temporary relief with steroid but doesn't want to take that again. Already on Flonase and allegra. Will add ABX. Start Keflex. Not most ideal for sinusitis but she has multiple drug allergies and says she feels comfortable with keflex. She declines 2nd round of steroid.  Call if not better in one week and will have to have In person visit at that time.   Knot in stomach/periumbilical mass - likely periumbilical hernia based on description.  Warned about when to seek emergency care.  Encouraged to schedule follow u in next couple of weeks for me to examine the area.   Dizziness - may be related to the sinusitis. If not better recommend in person visit to evaluate for BPPV with Diks halpike maneuver.    Time spent in encounter 25 minutes  I discussed the assessment and treatment plan with the patient. The patient was provided an opportunity to ask questions and all were answered. The patient agreed with the plan and demonstrated an understanding of the instructions.   The patient was advised to call back or seek an in-person evaluation if the symptoms worsen or if the condition fails to improve as anticipated.   Beatrice Lecher, MD

## 2019-04-18 DIAGNOSIS — Z01818 Encounter for other preprocedural examination: Secondary | ICD-10-CM | POA: Diagnosis not present

## 2019-04-21 DIAGNOSIS — K219 Gastro-esophageal reflux disease without esophagitis: Secondary | ICD-10-CM | POA: Diagnosis not present

## 2019-04-21 DIAGNOSIS — I2510 Atherosclerotic heart disease of native coronary artery without angina pectoris: Secondary | ICD-10-CM | POA: Diagnosis not present

## 2019-04-21 DIAGNOSIS — M205X1 Other deformities of toe(s) (acquired), right foot: Secondary | ICD-10-CM | POA: Diagnosis not present

## 2019-04-21 DIAGNOSIS — N183 Chronic kidney disease, stage 3 unspecified: Secondary | ICD-10-CM | POA: Diagnosis not present

## 2019-04-21 DIAGNOSIS — E7800 Pure hypercholesterolemia, unspecified: Secondary | ICD-10-CM | POA: Diagnosis not present

## 2019-04-21 DIAGNOSIS — I129 Hypertensive chronic kidney disease with stage 1 through stage 4 chronic kidney disease, or unspecified chronic kidney disease: Secondary | ICD-10-CM | POA: Diagnosis not present

## 2019-04-21 DIAGNOSIS — I252 Old myocardial infarction: Secondary | ICD-10-CM | POA: Diagnosis not present

## 2019-04-21 DIAGNOSIS — E785 Hyperlipidemia, unspecified: Secondary | ICD-10-CM | POA: Diagnosis not present

## 2019-05-08 DIAGNOSIS — M205X1 Other deformities of toe(s) (acquired), right foot: Secondary | ICD-10-CM | POA: Diagnosis not present

## 2019-05-16 DIAGNOSIS — G4733 Obstructive sleep apnea (adult) (pediatric): Secondary | ICD-10-CM | POA: Diagnosis not present

## 2019-05-22 DIAGNOSIS — G4733 Obstructive sleep apnea (adult) (pediatric): Secondary | ICD-10-CM | POA: Diagnosis not present

## 2019-05-30 ENCOUNTER — Telehealth: Payer: Self-pay

## 2019-05-30 NOTE — Telephone Encounter (Signed)
Sharon Reilly states she is going to have the COVID - 19 vaccine this weekend.   Patient advised. Our providers are recommending the vaccine to all our patients, especially if you are a patient who has medical conditions that make you high-risk for serious COVID disease.   As of right now there is NO known contraindication to getting the COVID vaccine. Meaning: everyone should be able to get this vaccine, even if they have other medical conditions or are pregnant. Side effects are always possible, but as with all vaccines, we believe the benefits outweigh the risks.   Important to remember: when people are having "reactions" to the vaccine, these reactions are almost always NORMAL immune system responses. OK to take Benadryl/diphenhydramine or Motrin/ibuprofen for these symptoms. If having any trouble breathing (signs of anaphylaxis) go to UC or ED. Very few people are having immune responses so strong they need to go to ED, but many people are not feeling well for 1 to 2 days after the vaccine is given.   We currently do not have any information about whether our office will be distributing the COVID vaccine. You can contact your local Health Department or go online to  [email or web address] For questions related to vaccine distribution or appointments, please email [email or web address] or call [phone].

## 2019-06-14 ENCOUNTER — Encounter: Payer: Self-pay | Admitting: Family Medicine

## 2019-06-14 ENCOUNTER — Other Ambulatory Visit: Payer: Self-pay

## 2019-06-14 ENCOUNTER — Ambulatory Visit (INDEPENDENT_AMBULATORY_CARE_PROVIDER_SITE_OTHER): Payer: Medicare HMO | Admitting: Family Medicine

## 2019-06-14 VITALS — BP 136/84 | HR 64 | Ht 62.0 in | Wt 255.0 lb

## 2019-06-14 DIAGNOSIS — R7301 Impaired fasting glucose: Secondary | ICD-10-CM

## 2019-06-14 DIAGNOSIS — K469 Unspecified abdominal hernia without obstruction or gangrene: Secondary | ICD-10-CM | POA: Diagnosis not present

## 2019-06-14 DIAGNOSIS — I2510 Atherosclerotic heart disease of native coronary artery without angina pectoris: Secondary | ICD-10-CM

## 2019-06-14 DIAGNOSIS — N1831 Chronic kidney disease, stage 3a: Secondary | ICD-10-CM

## 2019-06-14 DIAGNOSIS — G4733 Obstructive sleep apnea (adult) (pediatric): Secondary | ICD-10-CM | POA: Diagnosis not present

## 2019-06-14 DIAGNOSIS — I10 Essential (primary) hypertension: Secondary | ICD-10-CM

## 2019-06-14 LAB — BASIC METABOLIC PANEL WITH GFR
BUN/Creatinine Ratio: 18 (calc) (ref 6–22)
BUN: 24 mg/dL (ref 7–25)
CO2: 29 mmol/L (ref 20–32)
Calcium: 9.9 mg/dL (ref 8.6–10.4)
Chloride: 108 mmol/L (ref 98–110)
Creat: 1.3 mg/dL — ABNORMAL HIGH (ref 0.50–0.99)
GFR, Est African American: 51 mL/min/{1.73_m2} — ABNORMAL LOW (ref >=60)
GFR, Est Non African American: 44 mL/min/{1.73_m2} — ABNORMAL LOW (ref >=60)
Glucose, Bld: 92 mg/dL (ref 65–99)
Potassium: 4.3 mmol/L (ref 3.5–5.3)
Sodium: 143 mmol/L (ref 135–146)

## 2019-06-14 LAB — POCT GLYCOSYLATED HEMOGLOBIN (HGB A1C): Hemoglobin A1C: 5.7 % — AB (ref 4.0–5.6)

## 2019-06-14 NOTE — Assessment & Plan Note (Signed)
No recent chest pain  

## 2019-06-14 NOTE — Progress Notes (Addendum)
Established Patient Office Visit  Subjective:  Patient ID: Sharon Reilly, female    DOB: Aug 02, 1955  Age: 64 y.o. MRN: [id number]  CC:  Chief Complaint  Patient presents with  . Hypertension    HPI Sharon Reilly presents for   Hypertension- Pt denies chest pain, SOB, dizziness, or heart palpitations.  Taking meds as directed w/o problems.  Denies medication side effects.   Impaired fasting glucose-no increased thirst or urination. No symptoms consistent with hypoglycemia. She has made some big changes. She has really but down on sugar and sweet tea.    Obstructive sleep apnea-she says she is been very consistent with her CPAP machine she has been wearing it every night she feels like it is working well and does not need any additional supplies today.  He also noticed a bulge on the right side of her abdomen just lateral to the umbilicus.  She thinks it is a hernia from where she had her prior gallbladder surgery she notices that sometimes it will bulge and she is able to push it back and flat.  Sometimes it sore and tender but at other times it does not bother her.  Past Medical History:  Diagnosis Date  . Fibromyalgia   . Hyperlipidemia   . Hypertension   . Lupus ([place])   . Lupus (systemic lupus erythematosus) ([place])   . NSTEMI (non-ST elevated myocardial infarction) ([place]) 03/08/2016   [facility]  . Obesity   . Osteoarthritis   . Osteoporosis     Past Surgical History:  Procedure Laterality Date  . ABDOMINAL HYSTERECTOMY     partial  . BREAST SURGERY     reduction  . CARPAL TUNNEL RELEASE     left  . CHOLECYSTECTOMY Bilateral 05/14/2016  . epicondylitis     left  . fistulotomy    . great toe fusion     left  . MRI syr ortho spec     Dr. Christen Butter  . REDUCTION MAMMAPLASTY    . right elbow    . surgisis biodesign fistula plug    . talonavicular joint fusion     LT foot    Family History  Problem Relation Age of Onset  . Breast cancer Mother        Diagnosed in  her 59's.  Sharon Reilly Hyperlipidemia Mother   . Hypertension Mother   . Hyperlipidemia Father   . Diabetes Brother   . Diabetes Other        aunt  . Kidney failure Brother   . Thyroid disease Sister     Social History   Socioeconomic History  . Marital status: Single    Spouse name: Not on file  . Number of children: 1  . Years of education: 2 years college  . Highest education level: 12th grade  Occupational History  . Occupation: disabled    Comment: insurance  Tobacco Use  . Smoking status: Never Smoker  . Smokeless tobacco: Never Used  Substance and Sexual Activity  . Alcohol use: No  . Drug use: No  . Sexual activity: Never  Other Topics Concern  . Not on file  Social History Narrative   Lives in apartment at a senior living area. Socializes everday.   Social Determinants of Health   Financial Resource Strain:   . Difficulty of Paying Living Expenses:   Food Insecurity:   . Worried About Charity fundraiser in the Last Year:   . [place] in the  Last Year:   Transportation Needs:   . Film/video editor (Medical):   Sharon Reilly Lack of Transportation (Non-Medical):   Physical Activity:   . Days of Exercise per Week:   . Minutes of Exercise per Session:   Stress:   . Feeling of Stress :   Social Connections: Unknown  . Frequency of Communication with Friends and Family: Not on file  . Frequency of Social Gatherings with Friends and Family: Once a week  . Attends Religious Services: Not on file  . Active Member of Clubs or Organizations: Not on file  . Attends Archivist Meetings: Not on file  . Marital Status: Not on file  Intimate Partner Violence:   . Fear of Current or Ex-Partner:   . Emotionally Abused:   Sharon Reilly Physically Abused:   . Sexually Abused:     Outpatient Medications Prior to Visit  Medication Sig Dispense Refill  . acetaminophen (TYLENOL) 325 MG tablet Take 650 mg by mouth 2 (two) times daily.     Sharon Reilly albuterol (PROVENTIL HFA;VENTOLIN  HFA) 108 (90 Base) MCG/ACT inhaler Inhale 2 puffs into the lungs every 6 (six) hours as needed for wheezing or shortness of breath. 1 Inhaler 2  . AMBULATORY NON FORMULARY MEDICATION Medication Name: Call Huey Romans to get a download off her CPAP and fax to our office.  [phone] 1 Units 0  . aspirin 81 MG tablet Take 81 mg by mouth daily.      . fexofenadine (ALLEGRA) 180 MG tablet Take 1 tablet (180 mg total) by mouth daily. (Patient taking differently: Take 180 mg by mouth as needed. ) 30 tablet 6  . fluticasone (FLOVENT HFA) 110 MCG/ACT inhaler Inhale 1 puff into the lungs daily as needed.    . gabapentin (NEURONTIN) 100 MG capsule Take 200 mg by mouth daily.    Sharon Reilly losartan-hydrochlorothiazide (HYZAAR) 100-25 MG tablet Take 1 tablet by mouth daily. 90 tablet 3  . metoprolol tartrate (LOPRESSOR) 100 MG tablet TAKE ONE TABLET BY MOUTH TWICE DAILY 180 tablet 1  . Multiple Vitamins-Minerals (CENTRUM SILVER 50+WOMEN) TABS Take 1 tablet by mouth daily.    Sharon Reilly omeprazole (PRILOSEC) 40 MG capsule Take 1 capsule (40 mg total) by mouth 2 (two) times daily. 180 capsule 2  . rosuvastatin (CRESTOR) 10 MG tablet Take 1 tablet (10 mg total) by mouth daily. 90 tablet 2  . cephALEXin (KEFLEX) 500 MG capsule Take 1 capsule (500 mg total) by mouth 3 (three) times daily. 30 capsule 0  . methylPREDNISolone (MEDROL DOSEPAK) 4 MG TBPK tablet Take as directed by package insert. 21 tablet 0   No facility-administered medications prior to visit.    Allergies  Allergen Reactions  . Amoxicillin Hives and Itching    No rash.   . Erythromycin Hives  . Nsaids Other (See Comments)    CKD 3   . Sulfonamide Derivatives   . Doxycycline Hives  . Sulfa Antibiotics Hives and Rash    ROS Review of Systems    Objective:    Physical Exam  Constitutional: She is oriented to person, place, and time. She appears well-developed and well-nourished.  HENT:  Head: Normocephalic and atraumatic.  Cardiovascular: Normal rate,  regular rhythm and normal heart sounds.  Pulmonary/Chest: Effort normal and breath sounds normal.  Abdominal:  Does have what feels like a little weakness in the abdominal wall just to the right of the umbilicus.  Neurological: She is alert and oriented to person, place, and time.  Skin: Skin  is warm and dry.  Psychiatric: She has a normal mood and affect. Her behavior is normal.    BP 136/84   Pulse 64   Ht 5\' 2"  (1.575 m)   Wt 255 lb (115.7 kg)   SpO2 99%   BMI 46.64 kg/m  Wt Readings from Last 3 Encounters:  06/14/19 255 lb (115.7 kg)  03/15/19 255 lb (115.7 kg)  12/14/18 255 lb (115.7 kg)     There are no preventive care reminders to display for this patient.  There are no preventive care reminders to display for this patient.  Lab Results  Component Value Date   TSH 3.01 05/22/2015   Lab Results  Component Value Date   WBC 4.2 07/16/2018   HGB 11.4 (A) 07/16/2018   HCT 34 (A) 07/16/2018   MCV 93.2 07/16/2018   PLT 280 01/14/2017   Lab Results  Component Value Date   NA 142 12/14/2018   K 4.1 12/14/2018   CO2 30 12/14/2018   GLUCOSE 93 12/14/2018   BUN 24 12/14/2018   CREATININE 1.30 (H) 12/14/2018   BILITOT 0.4 08/01/2018   ALKPHOS 76 09/29/2016   AST 18 08/01/2018   ALT 22 08/01/2018   PROT 6.7 08/01/2018   ALBUMIN 4.2 07/16/2018   CALCIUM 9.4 12/14/2018   Lab Results  Component Value Date   CHOL 177 08/01/2018   Lab Results  Component Value Date   HDL 41 (L) 08/01/2018   Lab Results  Component Value Date   LDLCALC 110 (H) 08/01/2018   Lab Results  Component Value Date   TRIG 138 08/01/2018   Lab Results  Component Value Date   CHOLHDL 4.3 08/01/2018   Lab Results  Component Value Date   HGBA1C 5.7 (A) 06/14/2019      Assessment & Plan:   Problem List Items Addressed This Visit      Cardiovascular and Mediastinum   Essential hypertension, benign - Primary    Well controlled. Continue current regimen. Follow up in  6 mo       CAD (coronary artery disease)    No recent chest pain.         Respiratory   OSA (obstructive sleep apnea)    Doing well on CPAP. Doesn't need supplies.         Endocrine   IFG (impaired fasting glucose)    congratulated on her dietary changes. Well controlled. Continue current regimen. Follow up in  6 mo. A1C of 5.7.       Relevant Orders   POCT glycosylated hemoglobin (Hb A1C) (Completed)     Genitourinary   CKD (chronic kidney disease) stage 3, GFR 30-59 ml/min    Following renal function Q 6 mo      Relevant Orders   BASIC METABOLIC PANEL WITH GFR    Other Visit Diagnoses    Non-recurrent abdominal hernia without obstruction or gangrene, unspecified hernia type       Relevant Orders   Ambulatory referral to General Surgery      No orders of the defined types were placed in this encounter.   Follow-up: Return in about 6 months (around 12/14/2019) for Hypertension and Pre-diabetes .    Beatrice Lecher, MD

## 2019-06-14 NOTE — Assessment & Plan Note (Signed)
Well controlled. Continue current regimen. Follow up in  6 mo  

## 2019-06-14 NOTE — Assessment & Plan Note (Signed)
Following renal function Q 6mo  

## 2019-06-14 NOTE — Addendum Note (Signed)
Addended by: Beatrice Lecher D on: 06/14/2019 01:33 PM   Modules accepted: Orders

## 2019-06-14 NOTE — Assessment & Plan Note (Addendum)
congratulated on her dietary changes. Well controlled. Continue current regimen. Follow up in  6 mo. A1C of 5.7.

## 2019-06-14 NOTE — Assessment & Plan Note (Signed)
Doing well on CPAP. Doesn't need supplies.

## 2019-06-26 DIAGNOSIS — K429 Umbilical hernia without obstruction or gangrene: Secondary | ICD-10-CM | POA: Insufficient documentation

## 2019-06-29 IMAGING — MG DIGITAL SCREENING BILATERAL MAMMOGRAM WITH TOMO AND CAD
5 series · 6 of 17 positions shown · non-contrast
Comparison: Previous exam(s).

ACR Breast Density Category a: The breast tissue is almost entirely
fatty.

CLINICAL DATA: Screening.

EXAM:
DIGITAL SCREENING BILATERAL MAMMOGRAM WITH TOMO AND CAD

[L CC synth-2D]
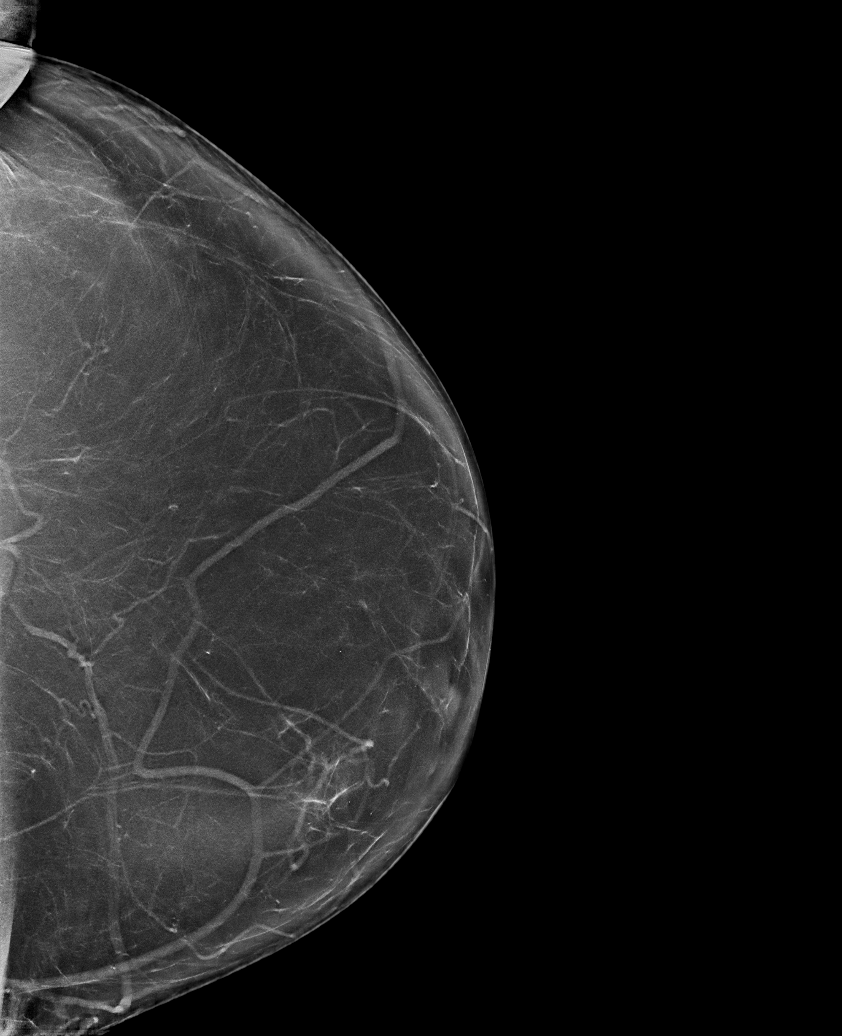

[R MLO synth-2D]
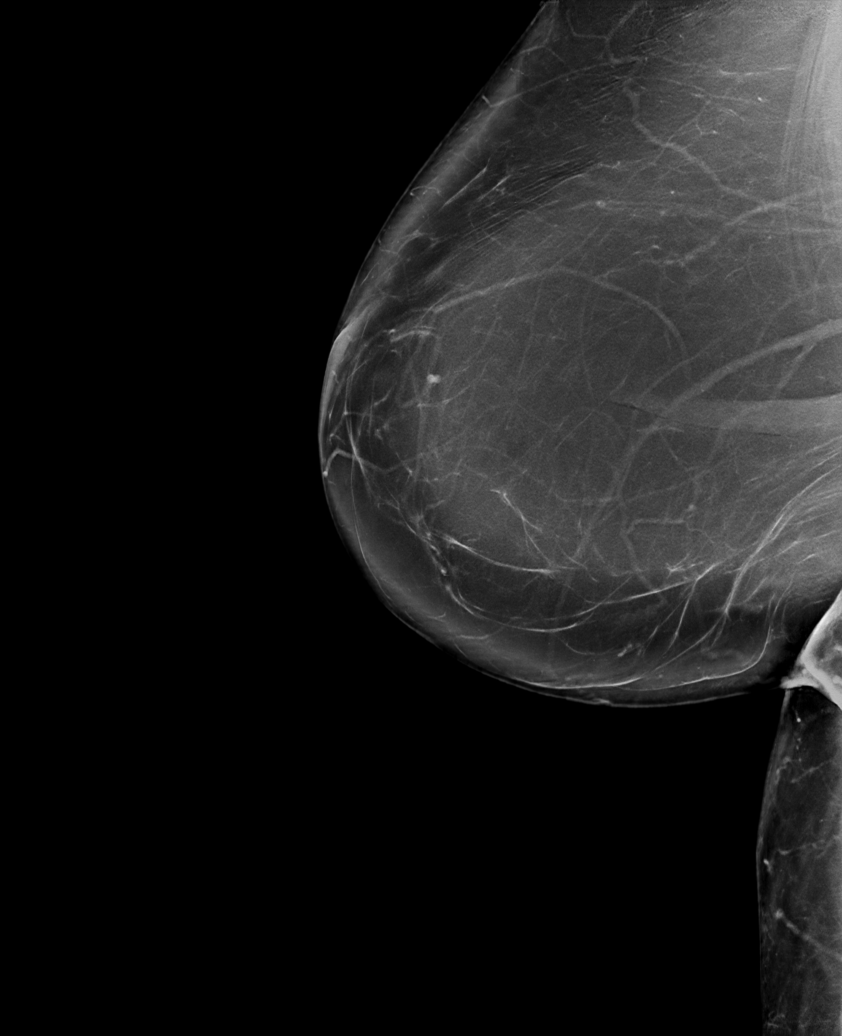

[R MLO tomo · 2 of 102 frames shown (1 of 2)]
[frame 33/102]
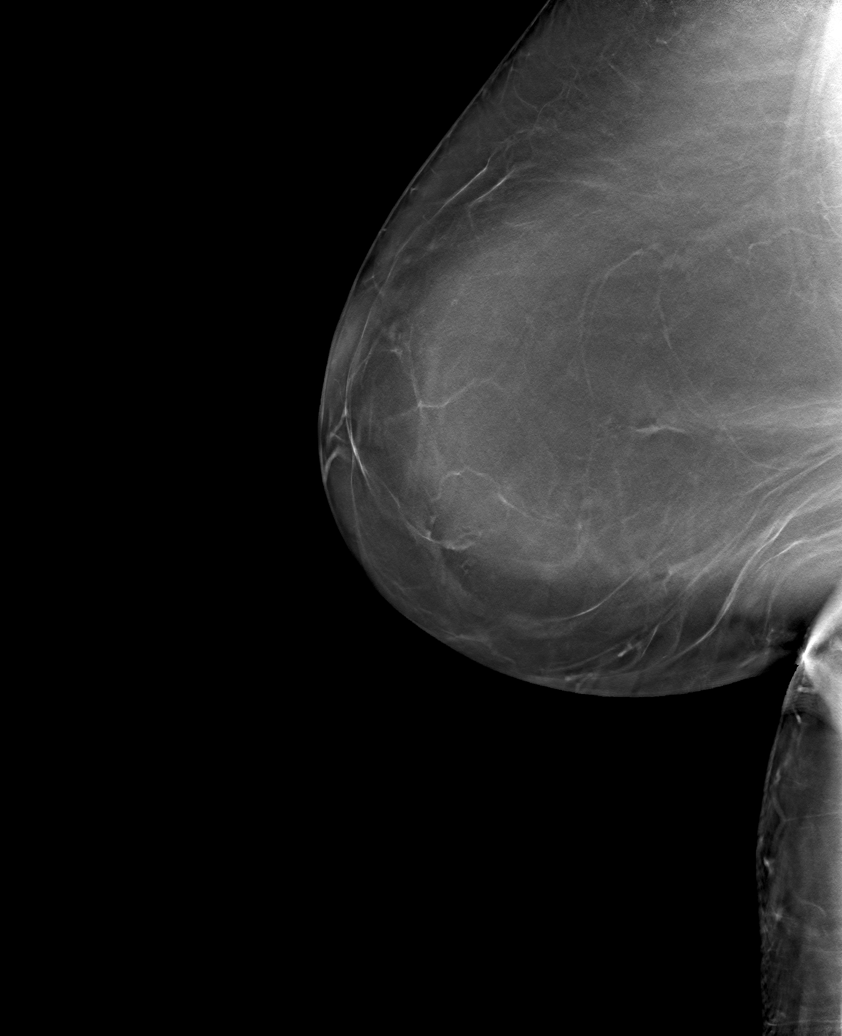
[frame 51/102]
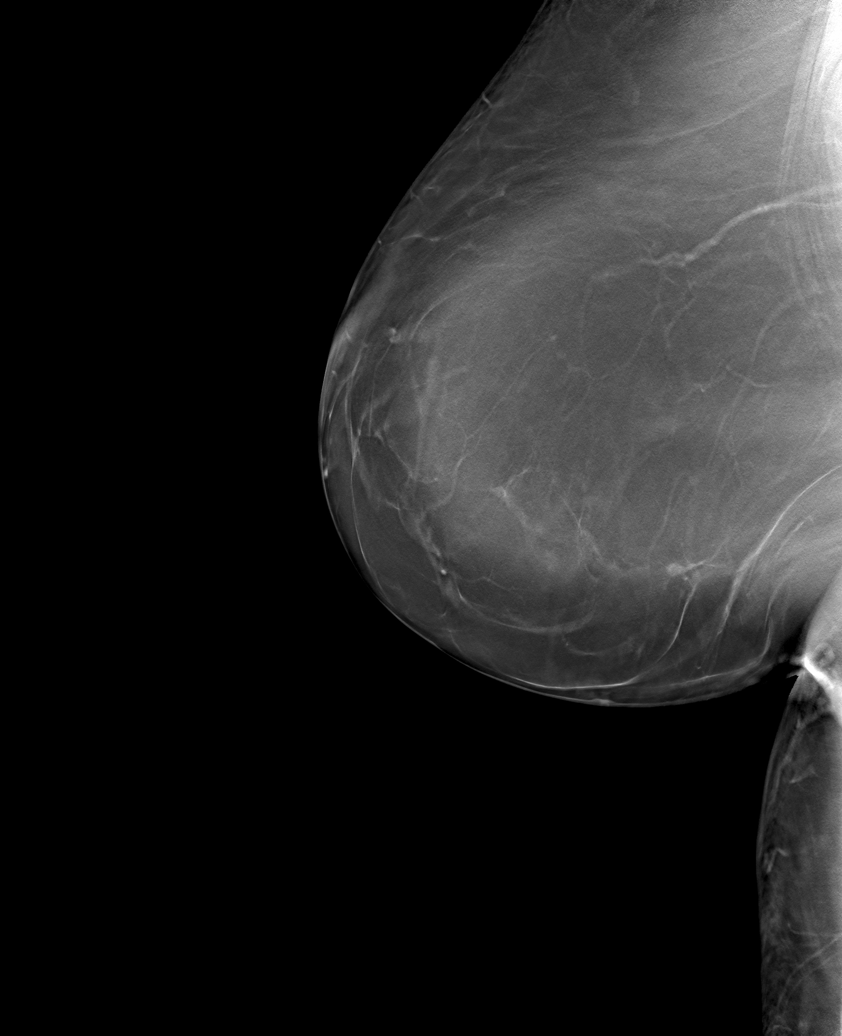

[R CC tomo · tomo slice 45/89.0]
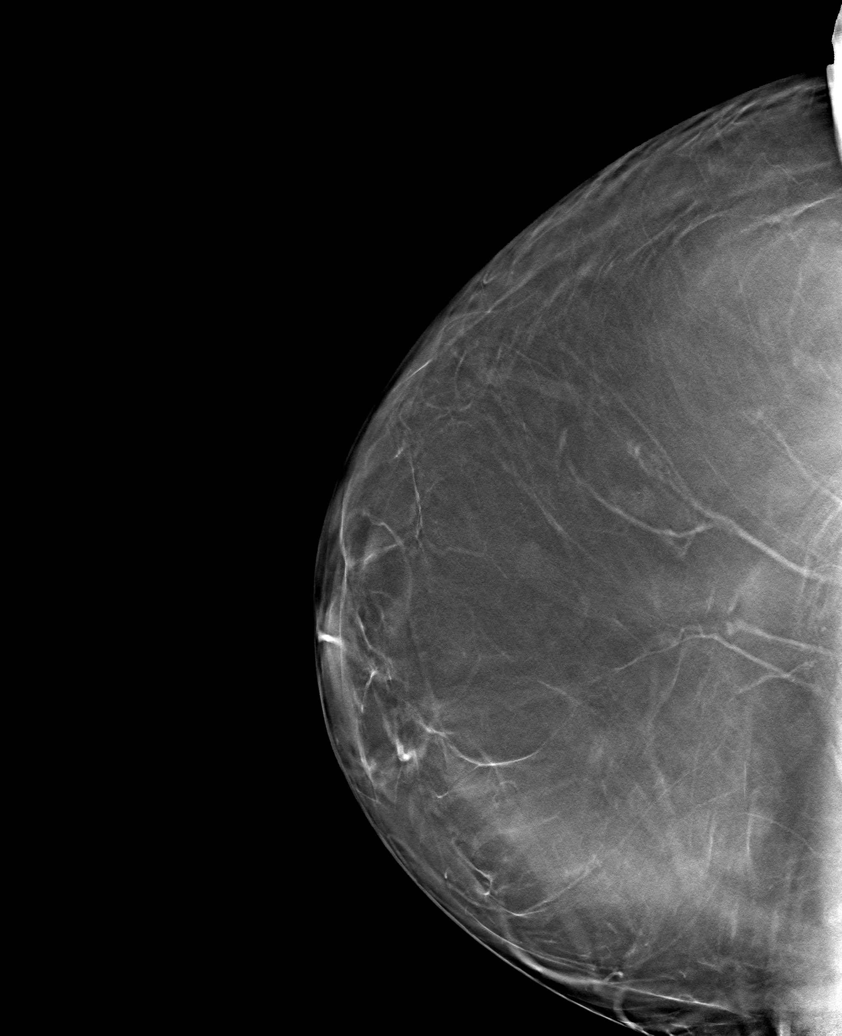

[R MLO tomo (2 of 2) · tomo slice 57/114.0]
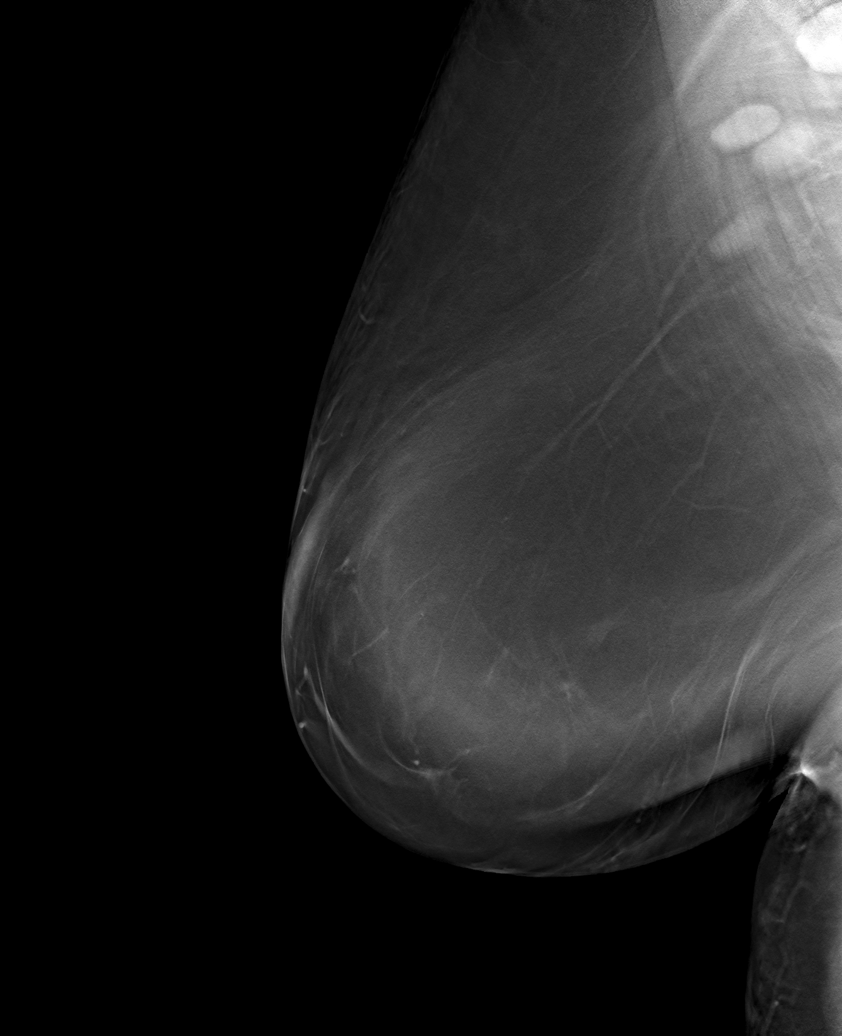

[6 of 17 positions shown; findings below may reference images not displayed]

FINDINGS: There are no findings suspicious for malignancy. Images were
processed with CAD.
IMPRESSION: No mammographic evidence of malignancy. A result letter of this
screening mammogram will be mailed directly to the patient.

RECOMMENDATION:
Screening mammogram in one year. (Code:[id number])

BI-RADS CATEGORY  1: Negative.

## 2019-07-21 ENCOUNTER — Other Ambulatory Visit: Payer: Self-pay | Admitting: Family Medicine

## 2019-07-21 DIAGNOSIS — Z9989 Dependence on other enabling machines and devices: Secondary | ICD-10-CM | POA: Diagnosis not present

## 2019-07-21 DIAGNOSIS — G4733 Obstructive sleep apnea (adult) (pediatric): Secondary | ICD-10-CM | POA: Diagnosis not present

## 2019-07-21 DIAGNOSIS — E0590 Thyrotoxicosis, unspecified without thyrotoxic crisis or storm: Secondary | ICD-10-CM | POA: Diagnosis not present

## 2019-07-21 DIAGNOSIS — K429 Umbilical hernia without obstruction or gangrene: Secondary | ICD-10-CM | POA: Diagnosis not present

## 2019-07-21 DIAGNOSIS — I129 Hypertensive chronic kidney disease with stage 1 through stage 4 chronic kidney disease, or unspecified chronic kidney disease: Secondary | ICD-10-CM | POA: Diagnosis not present

## 2019-07-21 DIAGNOSIS — M069 Rheumatoid arthritis, unspecified: Secondary | ICD-10-CM | POA: Diagnosis not present

## 2019-07-21 DIAGNOSIS — I252 Old myocardial infarction: Secondary | ICD-10-CM | POA: Diagnosis not present

## 2019-07-21 DIAGNOSIS — N1831 Chronic kidney disease, stage 3a: Secondary | ICD-10-CM | POA: Diagnosis not present

## 2019-07-21 DIAGNOSIS — E785 Hyperlipidemia, unspecified: Secondary | ICD-10-CM | POA: Diagnosis not present

## 2019-07-21 DIAGNOSIS — G4730 Sleep apnea, unspecified: Secondary | ICD-10-CM | POA: Diagnosis not present

## 2019-07-21 DIAGNOSIS — K420 Umbilical hernia with obstruction, without gangrene: Secondary | ICD-10-CM | POA: Diagnosis not present

## 2019-07-21 DIAGNOSIS — Z6841 Body Mass Index (BMI) 40.0 and over, adult: Secondary | ICD-10-CM | POA: Diagnosis not present

## 2019-07-21 DIAGNOSIS — N183 Chronic kidney disease, stage 3 unspecified: Secondary | ICD-10-CM | POA: Diagnosis not present

## 2019-07-21 DIAGNOSIS — M797 Fibromyalgia: Secondary | ICD-10-CM | POA: Diagnosis not present

## 2019-08-23 ENCOUNTER — Ambulatory Visit (INDEPENDENT_AMBULATORY_CARE_PROVIDER_SITE_OTHER): Payer: Medicare HMO

## 2019-08-23 ENCOUNTER — Other Ambulatory Visit: Payer: Self-pay

## 2019-08-23 ENCOUNTER — Encounter: Payer: Self-pay | Admitting: Sports Medicine

## 2019-08-23 ENCOUNTER — Ambulatory Visit (INDEPENDENT_AMBULATORY_CARE_PROVIDER_SITE_OTHER): Payer: Medicare HMO | Admitting: Sports Medicine

## 2019-08-23 DIAGNOSIS — M170 Bilateral primary osteoarthritis of knee: Secondary | ICD-10-CM | POA: Diagnosis not present

## 2019-08-23 MED ORDER — ACETAMINOPHEN ER 650 MG PO TBCR: 650.0000 mg | EXTENDED_RELEASE_TABLET | Freq: Three times a day (TID) | ORAL | 3 refills | Status: AC | PRN

## 2019-08-23 NOTE — Assessment & Plan Note (Signed)
Sharon Reilly returns, she is a pleasant 64 year old female, she has bilateral knee pain, right worse than left, medial joint line. She does have x-rays from 2018 that shows osteoarthritis. She desires a minimally pharmacologic approach, she will do arthritis from Tylenol 3 times daily, adding formal physical therapy, updated x-rays, return to see me in a month, injections if no better. The other option would be to simply add an NSAID, she has asked to avoid NSAIDs for no particular reason.

## 2019-08-23 NOTE — Progress Notes (Signed)
    Procedures performed today:    None.  Independent interpretation of notes and tests performed by another provider:   X-rays personally reviewed, moderate osteoarthritis from 2018.  Brief History, Exam, Impression, and Recommendations:    Primary osteoarthritis of both knees Sharon Reilly returns, she is a pleasant 64 year old female, she has bilateral knee pain, right worse than left, medial joint line. She does have x-rays from 2018 that shows osteoarthritis. She desires a minimally pharmacologic approach, she will do arthritis from Tylenol 3 times daily, adding formal physical therapy, updated x-rays, return to see me in a month, injections if no better. The other option would be to simply add an NSAID, she has asked to avoid NSAIDs for no particular reason.    ___________________________________________ Gwen Her. Dianah Field, M.D., ABFM., CAQSM. Primary Care and [place] Instructor of [place] of [facility] of [facility]

## 2019-08-25 ENCOUNTER — Other Ambulatory Visit: Payer: Self-pay | Admitting: Family Medicine

## 2019-08-30 ENCOUNTER — Ambulatory Visit: Payer: Medicare HMO | Admitting: Rehabilitative and Restorative Service Providers"

## 2019-09-04 DIAGNOSIS — H5213 Myopia, bilateral: Secondary | ICD-10-CM | POA: Diagnosis not present

## 2019-09-04 DIAGNOSIS — H25813 Combined forms of age-related cataract, bilateral: Secondary | ICD-10-CM | POA: Diagnosis not present

## 2019-09-04 DIAGNOSIS — H524 Presbyopia: Secondary | ICD-10-CM | POA: Diagnosis not present

## 2019-09-20 ENCOUNTER — Ambulatory Visit (INDEPENDENT_AMBULATORY_CARE_PROVIDER_SITE_OTHER): Payer: Medicare HMO | Admitting: Sports Medicine

## 2019-09-20 ENCOUNTER — Other Ambulatory Visit: Payer: Self-pay

## 2019-09-20 ENCOUNTER — Encounter: Payer: Self-pay | Admitting: Sports Medicine

## 2019-09-20 DIAGNOSIS — M170 Bilateral primary osteoarthritis of knee: Secondary | ICD-10-CM | POA: Diagnosis not present

## 2019-09-20 NOTE — Assessment & Plan Note (Signed)
This is a very pleasant 64 year old female, she has known knee osteoarthritis, doing arthritis strength Tylenol 3 times daily, home exercises, she was unable to do physical therapy but is okay doing the home rehab. Overall because she is improving she desires to hold off on injection which I think is appropriate. She does also desire to avoid NSAIDs. I would like to see her back in a month and if she is not doing sufficiently better we will do bilateral steroid injections.

## 2019-09-20 NOTE — Progress Notes (Signed)
° ° °  Procedures performed today:    None.  Independent interpretation of notes and tests performed by another provider:   None.  Brief History, Exam, Impression, and Recommendations:    Primary osteoarthritis of both knees This is a very pleasant 64 year old female, she has known knee osteoarthritis, doing arthritis strength Tylenol 3 times daily, home exercises, she was unable to do physical therapy but is okay doing the home rehab. Overall because she is improving she desires to hold off on injection which I think is appropriate. She does also desire to avoid NSAIDs. I would like to see her back in a month and if she is not doing sufficiently better we will do bilateral steroid injections.    ___________________________________________ Gwen Her. Dianah Field, M.D., ABFM., CAQSM. Primary Care and [place] Instructor of [place] of [facility]

## 2019-09-29 DIAGNOSIS — G4733 Obstructive sleep apnea (adult) (pediatric): Secondary | ICD-10-CM | POA: Diagnosis not present

## 2019-10-18 ENCOUNTER — Ambulatory Visit (INDEPENDENT_AMBULATORY_CARE_PROVIDER_SITE_OTHER): Payer: Medicare HMO | Admitting: Sports Medicine

## 2019-10-18 DIAGNOSIS — M170 Bilateral primary osteoarthritis of knee: Secondary | ICD-10-CM

## 2019-10-18 DIAGNOSIS — M1711 Unilateral primary osteoarthritis, right knee: Secondary | ICD-10-CM | POA: Diagnosis not present

## 2019-10-18 MED ORDER — DICLOFENAC SODIUM 2 % EX SOLN: 1.0000 | Freq: Two times a day (BID) | CUTANEOUS | 0 refills | Status: DC

## 2019-10-18 NOTE — Assessment & Plan Note (Signed)
Sharon Reilly returns, she has bilateral knee osteoarthritis, right worse than left, medial compartment. Arthritis from Tylenol provides minimal relief, she still has discomfort but is still somewhat hesitant to consider an injection. She does not desire anything stronger such as tramadol or Tylenol 3, we are going to add topical Pennsaid and had a reaction knee brace. If she does well we can add one for the left side as well. She desires an open-ended follow-up.

## 2019-10-18 NOTE — Progress Notes (Signed)
    Procedures performed today:    None.  Independent interpretation of notes and tests performed by another provider:   None.  Brief History, Exam, Impression, and Recommendations:    Primary osteoarthritis of both knees Sharon Reilly returns, she has bilateral knee osteoarthritis, right worse than left, medial compartment. Arthritis from Tylenol provides minimal relief, she still has discomfort but is still somewhat hesitant to consider an injection. She does not desire anything stronger such as tramadol or Tylenol 3, we are going to add topical Pennsaid and had a reaction knee brace. If she does well we can add one for the left side as well. She desires an open-ended follow-up.    ___________________________________________ Sharon Reilly. Sharon Reilly, M.D., ABFM., CAQSM. Primary Care and [place] Instructor of [place] of [facility] of [facility]

## 2019-11-07 ENCOUNTER — Other Ambulatory Visit: Payer: Self-pay | Admitting: Family Medicine

## 2019-11-07 DIAGNOSIS — Z1231 Encounter for screening mammogram for malignant neoplasm of breast: Secondary | ICD-10-CM

## 2019-11-22 ENCOUNTER — Ambulatory Visit: Payer: Medicare HMO

## 2019-11-23 ENCOUNTER — Ambulatory Visit (INDEPENDENT_AMBULATORY_CARE_PROVIDER_SITE_OTHER): Payer: Medicare HMO | Admitting: Nurse Practitioner

## 2019-11-23 ENCOUNTER — Encounter: Payer: Self-pay | Admitting: Nurse Practitioner

## 2019-11-23 VITALS — BP 147/83 | HR 56 | Ht 62.0 in | Wt 248.0 lb

## 2019-11-23 DIAGNOSIS — Z23 Encounter for immunization: Secondary | ICD-10-CM

## 2019-11-23 DIAGNOSIS — Z0000 Encounter for general adult medical examination without abnormal findings: Secondary | ICD-10-CM | POA: Diagnosis not present

## 2019-11-23 NOTE — Progress Notes (Signed)
Discussed ways to increase physical activity with limitations due to joint pain.

## 2019-11-23 NOTE — Patient Instructions (Addendum)
Someone will call you to schedule the bone density test.   Bone Density Test The bone density test uses a special type of X-ray to measure the amount of calcium and other minerals in your bones. It can measure bone density in the hip and the spine. The test procedure is similar to having a regular X-ray. This test may also be called:  Bone densitometry.  Bone mineral density test.  Dual-energy X-ray absorptiometry (DEXA). You may have this test to:  Diagnose a condition that causes weak or thin bones (osteoporosis).  Screen you for osteoporosis.  Predict your risk for a broken bone (fracture).  Determine how well your osteoporosis treatment is working. Tell a health care provider about:  Any allergies you have.  All medicines you are taking, including vitamins, herbs, eye drops, creams, and over-the-counter medicines.  Any problems you or family members have had with anesthetic medicines.  Any blood disorders you have.  Any surgeries you have had.  Any medical conditions you have.  Whether you are pregnant or may be pregnant.  Any medical tests you have had within the past 14 days that used contrast material. What are the risks? Generally, this is a safe procedure. However, it does expose you to a small amount of radiation, which can slightly increase your cancer risk. What happens before the procedure?  Do not take any calcium supplements starting 24 hours before your test.  Remove all metal jewelry, eyeglasses, dental appliances, and any other metal objects. What happens during the procedure?   You will lie down on an exam table. There will be an X-ray generator below you and an imaging device above you.  Other devices, such as boxes or braces, may be used to position your body properly for the scan.  The machine will slowly scan your body. You will need to keep still.  The images will show up on a screen in the room. Images will be examined by a specialist after  your test is done. The procedure may vary among health care providers and hospitals. What happens after the procedure?  It is up to you to get your test results. Ask your health care provider, or the department that is doing the test, when your results will be ready. Summary  A bone density test is an imaging test that uses a type of X-ray to measure the amount of calcium and other minerals in your bones.  The test may be used to diagnose or screen you for a condition that causes weak or thin bones (osteoporosis), predict your risk for a broken bone (fracture), or determine how well your osteoporosis treatment is working.  Do not take any calcium supplements starting 24 hours before your test.  Ask your health care provider, or the department that is doing the test, when your results will be ready. This information is not intended to replace advice given to you by your health care provider. Make sure you discuss any questions you have with your health care provider. Document Revised: 02/18/2017 Document Reviewed: 12/07/2016 Elsevier Patient Education  [place].   Colonoscopy, Adult A colonoscopy is a procedure to look at the entire large intestine. This procedure is done using a long, thin, flexible tube that has a camera on the end. You may have a colonoscopy:  As a part of normal colorectal screening.  If you have certain symptoms, such as: ? A low number of red blood cells in your blood (anemia). ? Diarrhea that does  not go away. ? Pain in your abdomen. ? Blood in your stool. A colonoscopy can help screen for and diagnose medical problems, including:  Tumors.  Extra tissue that grows where mucus forms (polyps).  Inflammation.  Areas of bleeding. Tell your health care provider about:  Any allergies you have.  All medicines you are taking, including vitamins, herbs, eye drops, creams, and over-the-counter medicines.  Any problems you or family members have had  with anesthetic medicines.  Any blood disorders you have.  Any surgeries you have had.  Any medical conditions you have.  Any problems you have had with having bowel movements.  Whether you are pregnant or may be pregnant. What are the risks? Generally, this is a safe procedure. However, problems may occur, including:  Bleeding.  Damage to your intestine.  Allergic reactions to medicines given during the procedure.  Infection. This is rare. What happens before the procedure? Eating and drinking restrictions Follow instructions from your health care provider about eating or drinking restrictions, which may include:  A few days before the procedure: ? Follow a low-fiber diet. ? Avoid nuts, seeds, dried fruit, raw fruits, and vegetables.  1-3 days before the procedure: ? Eat only gelatin dessert or ice pops. ? Drink only clear liquids, such as water, clear juice, clear broth or bouillon, black coffee or tea, or clear soft drinks or sports drinks. ? Avoid liquids that contain red or purple dye.  The day of the procedure: ? Do not eat solid foods. You may continue to drink clear liquids until up to 2 hours before the procedure. ? Do not eat or drink anything starting 2 hours before the procedure, or within the time period that your health care provider recommends. Bowel prep If you were prescribed a bowel prep to take by mouth (orally) to clean out your colon:  Take it as told by your health care provider. Starting the day before your procedure, you will need to drink a large amount of liquid medicine. The liquid will cause you to have many bowel movements of loose stool until your stool becomes almost clear or light green.  If your skin or the opening between the buttocks (anus) gets irritated from diarrhea, you may relieve the irritation using: ? Wipes with medicine in them, such as adult wet wipes with aloe and vitamin E. ? A product to soothe skin, such as petroleum  jelly.  If you vomit while drinking the bowel prep: ? Take a break for up to 60 minutes. ? Begin the bowel prep again. ? Call your health care provider if you keep vomiting or you cannot take the bowel prep without vomiting.  To clean out your colon, you may also be given: ? Laxative medicines. These help you have a bowel movement. ? Instructions for enema use. An enema is liquid medicine injected into your rectum. Medicines Ask your health care provider about:  Changing or stopping your regular medicines or supplements. This is especially important if you are taking iron supplements, diabetes medicines, or blood thinners.  Taking medicines such as aspirin and ibuprofen. These medicines can thin your blood. Do not take these medicines unless your health care provider tells you to take them.  Taking over-the-counter medicines, vitamins, herbs, and supplements. General instructions  Ask your health care provider what steps will be taken to help prevent infection. These may include washing skin with a germ-killing soap.  Plan to have someone take you home from the hospital or clinic. What  happens during the procedure?   An IV will be inserted into one of your veins.  You may be given one or more of the following: ? A medicine to help you relax (sedative). ? A medicine to numb the area (local anesthetic). ? A medicine to make you fall asleep (general anesthetic). This is rarely needed.  You will lie on your side with your knees bent.  The tube will: ? Have oil or gel put on it (be lubricated). ? Be inserted into your anus. ? Be gently eased through all parts of your large intestine.  Air will be sent into your colon to keep it open. This may cause some pressure or cramping.  Images will be taken with the camera and will appear on a screen.  A small tissue sample may be removed to be looked at under a microscope (biopsy). The tissue may be sent to a lab for testing if any signs  of problems are found.  If small polyps are found, they may be removed and checked for cancer cells.  When the procedure is finished, the tube will be removed. The procedure may vary among health care providers and hospitals. What happens after the procedure?  Your blood pressure, heart rate, breathing rate, and blood oxygen level will be monitored until you leave the hospital or clinic.  You may have a small amount of blood in your stool.  You may pass gas and have mild cramping or bloating in your abdomen. This is caused by the air that was used to open your colon during the exam.  Do not drive for 24 hours after the procedure.  It is up to you to get the results of your procedure. Ask your health care provider, or the department that is doing the procedure, when your results will be ready. Summary  A colonoscopy is a procedure to look at the entire large intestine.  Follow instructions from your health care provider about eating and drinking before the procedure.  If you were prescribed an oral bowel prep to clean out your colon, take it as told by your health care provider.  During the colonoscopy, a flexible tube with a camera on its end is inserted into the anus and then passed into the other parts of the large intestine. This information is not intended to replace advice given to you by your health care provider. Make sure you discuss any questions you have with your health care provider. Document Revised: 08/26/2018 Document Reviewed: 08/26/2018 Elsevier Patient Education  [place] Prevention in the Home, Adult Falls can cause injuries. They can happen to people of all ages. There are many things you can do to make your home safe and to help prevent falls. Ask for help when making these changes, if needed. What actions can I take to prevent falls? General Instructions  Use good lighting in all rooms. Replace any light bulbs that burn out.  Turn on the  lights when you go into a dark area. Use night-lights.  Keep items that you use often in easy-to-reach places. Lower the shelves around your home if necessary.  Set up your furniture so you have a clear path. Avoid moving your furniture around.  Do not have throw rugs and other things on the floor that can make you trip.  Avoid walking on wet floors.  If any of your floors are uneven, fix them.  Add color or contrast paint or tape to clearly mark  and help you see: ? Any grab bars or handrails. ? First and last steps of stairways. ? Where the edge of each step is.  If you use a stepladder: ? Make sure that it is fully opened. Do not climb a closed stepladder. ? Make sure that both sides of the stepladder are locked into place. ? Ask someone to hold the stepladder for you while you use it.  If there are any pets around you, be aware of where they are. What can I do in the bathroom?      Keep the floor dry. Clean up any water that spills onto the floor as soon as it happens.  Remove soap buildup in the tub or shower regularly.  Use non-skid mats or decals on the floor of the tub or shower.  Attach bath mats securely with double-sided, non-slip rug tape.  If you need to sit down in the shower, use a plastic, non-slip stool.  Install grab bars by the toilet and in the tub and shower. Do not use towel bars as grab bars. What can I do in the bedroom?  Make sure that you have a light by your bed that is easy to reach.  Do not use any sheets or blankets that are too big for your bed. They should not hang down onto the floor.  Have a firm chair that has side arms. You can use this for support while you get dressed. What can I do in the kitchen?  Clean up any spills right away.  If you need to reach something above you, use a strong step stool that has a grab bar.  Keep electrical cords out of the way.  Do not use floor polish or wax that makes floors slippery. If you must  use wax, use non-skid floor wax. What can I do with my stairs?  Do not leave any items on the stairs.  Make sure that you have a light switch at the top of the stairs and the bottom of the stairs. If you do not have them, ask someone to add them for you.  Make sure that there are handrails on both sides of the stairs, and use them. Fix handrails that are broken or loose. Make sure that handrails are as long as the stairways.  Install non-slip stair treads on all stairs in your home.  Avoid having throw rugs at the top or bottom of the stairs. If you do have throw rugs, attach them to the floor with carpet tape.  Choose a carpet that does not hide the edge of the steps on the stairway.  Check any carpeting to make sure that it is firmly attached to the stairs. Fix any carpet that is loose or worn. What can I do on the outside of my home?  Use bright outdoor lighting.  Regularly fix the edges of walkways and driveways and fix any cracks.  Remove anything that might make you trip as you walk through a door, such as a raised step or threshold.  Trim any bushes or trees on the path to your home.  Regularly check to see if handrails are loose or broken. Make sure that both sides of any steps have handrails.  Install guardrails along the edges of any raised decks and porches.  Clear walking paths of anything that might make someone trip, such as tools or rocks.  Have any leaves, snow, or ice cleared regularly.  Use sand or salt on walking paths  during winter.  Clean up any spills in your garage right away. This includes grease or oil spills. What other actions can I take?  Wear shoes that: ? Have a low heel. Do not wear high heels. ? Have rubber bottoms. ? Are comfortable and fit you well. ? Are closed at the toe. Do not wear open-toe sandals.  Use tools that help you move around (mobility aids) if they are needed. These  include: ? Canes. ? Walkers. ? Scooters. ? Crutches.  Review your medicines with your doctor. Some medicines can make you feel dizzy. This can increase your chance of falling. Ask your doctor what other things you can do to help prevent falls. Where to find more information  Centers for Disease Control and Prevention, [facility]: https://garcia.biz/  [employer] on Aging: BrainJudge.co.uk Contact a doctor if:  You are afraid of falling at home.  You feel weak, drowsy, or dizzy at home.  You fall at home. Summary  There are many simple things that you can do to make your home safe and to help prevent falls.  Ways to make your home safe include removing tripping hazards and installing grab bars in the bathroom.  Ask for help when making these changes in your home. This information is not intended to replace advice given to you by your health care provider. Make sure you discuss any questions you have with your health care provider. Document Revised: 05/26/2018 Document Reviewed: 09/17/2016 Elsevier Patient Education  2020 [place] Maintenance, Female Adopting a healthy lifestyle and getting preventive care are important in promoting health and wellness. Ask your health care provider about:  The right schedule for you to have regular tests and exams.  Things you can do on your own to prevent diseases and keep yourself healthy. What should I know about diet, weight, and exercise? Eat a healthy diet   Eat a diet that includes plenty of vegetables, fruits, low-fat dairy products, and lean protein.  Do not eat a lot of foods that are high in solid fats, added sugars, or sodium. Maintain a healthy weight Body mass index (BMI) is used to identify weight problems. It estimates body fat based on height and weight. Your health care provider can help determine your BMI and help you achieve or maintain a healthy weight. Get regular exercise Get regular  exercise. This is one of the most important things you can do for your health. Most adults should:  Exercise for at least 150 minutes each week. The exercise should increase your heart rate and make you sweat (moderate-intensity exercise).  Do strengthening exercises at least twice a week. This is in addition to the moderate-intensity exercise.  Spend less time sitting. Even light physical activity can be beneficial. Watch cholesterol and blood lipids Have your blood tested for lipids and cholesterol at 64 years of age, then have this test every 5 years. Have your cholesterol levels checked more often if:  Your lipid or cholesterol levels are high.  You are older than 64 years of age.  You are at high risk for heart disease. What should I know about cancer screening? Depending on your health history and family history, you may need to have cancer screening at various ages. This may include screening for:  Breast cancer.  Cervical cancer.  Colorectal cancer.  Skin cancer.  Lung cancer. What should I know about heart disease, diabetes, and high blood pressure? Blood pressure and heart disease  High blood pressure causes  heart disease and increases the risk of stroke. This is more likely to develop in people who have high blood pressure readings, are of African descent, or are overweight.  Have your blood pressure checked: ? Every 3-5 years if you are 86-15 years of age. ? Every year if you are 103 years old or older. Diabetes Have regular diabetes screenings. This checks your fasting blood sugar level. Have the screening done:  Once every three years after age 56 if you are at a normal weight and have a low risk for diabetes.  More often and at a younger age if you are overweight or have a high risk for diabetes. What should I know about preventing infection? Hepatitis B If you have a higher risk for hepatitis B, you should be screened for this virus. Talk with your health  care provider to find out if you are at risk for hepatitis B infection. Hepatitis C Testing is recommended for:  Everyone born from [birth year] through 1965.  Anyone with known risk factors for hepatitis C. Sexually transmitted infections (STIs)  Get screened for STIs, including gonorrhea and chlamydia, if: ? You are sexually active and are younger than 63 years of age. ? You are older than 64 years of age and your health care provider tells you that you are at risk for this type of infection. ? Your sexual activity has changed since you were last screened, and you are at increased risk for chlamydia or gonorrhea. Ask your health care provider if you are at risk.  Ask your health care provider about whether you are at high risk for HIV. Your health care provider may recommend a prescription medicine to help prevent HIV infection. If you choose to take medicine to prevent HIV, you should first get tested for HIV. You should then be tested every 3 months for as long as you are taking the medicine. Pregnancy  If you are about to stop having your period (premenopausal) and you may become pregnant, seek counseling before you get pregnant.  Take 400 to 800 micrograms (mcg) of folic acid every day if you become pregnant.  Ask for birth control (contraception) if you want to prevent pregnancy. Osteoporosis and menopause Osteoporosis is a disease in which the bones lose minerals and strength with aging. This can result in bone fractures. If you are 64 years old or older, or if you are at risk for osteoporosis and fractures, ask your health care provider if you should:  Be screened for bone loss.  Take a calcium or vitamin D supplement to lower your risk of fractures.  Be given hormone replacement therapy (HRT) to treat symptoms of menopause. Follow these instructions at home: Lifestyle  Do not use any products that contain nicotine or tobacco, such as cigarettes, e-cigarettes, and chewing tobacco.  If you need help quitting, ask your health care provider.  Do not use street drugs.  Do not share needles.  Ask your health care provider for help if you need support or information about quitting drugs. Alcohol use  Do not drink alcohol if: ? Your health care provider tells you not to drink. ? You are pregnant, may be pregnant, or are planning to become pregnant.  If you drink alcohol: ? Limit how much you use to 0-1 drink a day. ? Limit intake if you are breastfeeding.  Be aware of how much alcohol is in your drink. In the [place] one drink equals one 12 oz bottle of beer (355 mL),  one 5 oz glass of wine (148 mL), or one 1 oz glass of hard liquor (44 mL). General instructions  Schedule regular health, dental, and eye exams.  Stay current with your vaccines.  Tell your health care provider if: ? You often feel depressed. ? You have ever been abused or do not feel safe at home. Summary  Adopting a healthy lifestyle and getting preventive care are important in promoting health and wellness.  Follow your health care provider's instructions about healthy diet, exercising, and getting tested or screened for diseases.  Follow your health care provider's instructions on monitoring your cholesterol and blood pressure. This information is not intended to replace advice given to you by your health care provider. Make sure you discuss any questions you have with your health care provider. Document Revised: 01/26/2018 Document Reviewed: 01/26/2018 Elsevier Patient Education  2020 [employer].

## 2019-11-23 NOTE — Progress Notes (Signed)
Subjective:   Sharon Reilly is a 64 y.o. female who presents for Medicare Annual (Subsequent) preventive examination.  Review of Systems    Neuro: Denies difficulty remembering daily tasks, people, or places.  Ear: Denies difficulty hearing or need to increase volume on television or telephone to hear Eye: Denies visual changes, difficulty reading normal print, or visual field deficits. Cardiac: Denies chest pain, palpitations, dizziness, shortness of breath, pain in lower extremities, or night time waking with shortness of breath. Lung: Denies shortness of breath, difficulty breathing, chronic cough, or dizziness.  GI: Denies changes in bowel habits, blood in stool, difficulty passing stool, decreased intake of food or drink, nausea, or vomiting.  GU: Denies dark urine, malodorous urine, increased or decreased urination, or urinary incontinence. Endorses urinary urgency. MSK: Denies difficulty grasping, or new MSK pain.  Endorses joint pain and knee weakness.  Skin: Denies changes to the skin, fragile skin, or increased bruising.  Constitution: Denies fatigue, weakness, or confusion.        Objective:    Today's Vitals   11/23/19 1132 11/23/19 1143  BP: (!) 147/83   Pulse: (!) 56   SpO2: 100%   Weight: 248 lb (112.5 kg)   Height: 5\' 2"  (1.575 m)   PainSc:  5    Body mass index is 45.36 kg/m.  Advanced Directives 11/23/2019 11/21/2018 11/16/2017 02/17/2017 03/23/2016 09/27/2013 05/31/2013  Does Patient Have a Medical Advance Directive? No No No No (No Data) No Patient does not have advance directive;Patient would like information  Would patient like information on creating a medical advance directive? - No - Patient declined No - Patient declined No - Patient declined - No - patient declined information Advance directive packet given    Current Medications (verified) Outpatient Encounter Medications as of 11/23/2019  Medication Sig  . acetaminophen (TYLENOL) 650 MG CR tablet Take  1 tablet (650 mg total) by mouth every 8 (eight) hours as needed for pain.  [name] albuterol (PROVENTIL HFA;VENTOLIN HFA) 108 (90 Base) MCG/ACT inhaler Inhale 2 puffs into the lungs every 6 (six) hours as needed for wheezing or shortness of breath.  . AMBULATORY NON FORMULARY MEDICATION Medication Name: Call Huey Romans to get a download off her CPAP and fax to our office.  [phone]  . aspirin 81 MG tablet Take 81 mg by mouth daily.    . Diclofenac Sodium 2 % SOLN Place 1 spray onto the skin 2 (two) times daily.  . fexofenadine (ALLEGRA) 180 MG tablet Take 1 tablet (180 mg total) by mouth daily. (Patient taking differently: Take 180 mg by mouth as needed. )  . fluticasone (FLOVENT HFA) 110 MCG/ACT inhaler Inhale 1 puff into the lungs daily as needed.  . gabapentin (NEURONTIN) 100 MG capsule Take 200 mg by mouth daily.  [name] losartan-hydrochlorothiazide (HYZAAR) 100-25 MG tablet Take 1 tablet by mouth daily.  . metoprolol tartrate (LOPRESSOR) 100 MG tablet Take 1 tablet (100 mg total) by mouth 2 (two) times daily.  . Multiple Vitamins-Minerals (CENTRUM SILVER 50+WOMEN) TABS Take 1 tablet by mouth daily.  [name] omeprazole (PRILOSEC) 40 MG capsule Take 1 capsule (40 mg total) by mouth 2 (two) times daily.  . rosuvastatin (CRESTOR) 10 MG tablet Take 1 tablet (10 mg total) by mouth daily.   No facility-administered encounter medications on file as of 11/23/2019.    Allergies (verified) Amoxicillin, Erythromycin, Nsaids, Sulfonamide derivatives, Doxycycline, and Sulfa antibiotics   History: Past Medical History:  Diagnosis Date  . Fibromyalgia   .  Hyperlipidemia   . Hypertension   . Lupus ([place])   . Lupus (systemic lupus erythematosus) ([place])   . NSTEMI (non-ST elevated myocardial infarction) ([place]) 03/08/2016   [facility]  . Obesity   . Osteoarthritis   . Osteoporosis    Past Surgical History:  Procedure Laterality Date  . ABDOMINAL HYSTERECTOMY     partial  . BREAST SURGERY     reduction  . CARPAL  TUNNEL RELEASE     left  . CHOLECYSTECTOMY Bilateral 05/14/2016  . epicondylitis     left  . fistulotomy    . great toe fusion     left  . MRI syr ortho spec     Dr. Christen Butter  . REDUCTION MAMMAPLASTY    . right elbow    . surgisis biodesign fistula plug    . talonavicular joint fusion     LT foot   Family History  Problem Relation Age of Onset  . Breast cancer Mother        Diagnosed in her 41's.  [name] Hyperlipidemia Mother   . Hypertension Mother   . Hyperlipidemia Father   . Diabetes Brother   . Diabetes Other        aunt  . Kidney failure Brother   . Thyroid disease Sister    Social History   Socioeconomic History  . Marital status: Single    Spouse name: Not on file  . Number of children: 1  . Years of education: 2 years college  . Highest education level: 12th grade  Occupational History  . Occupation: disabled    Comment: insurance  Tobacco Use  . Smoking status: Never Smoker  . Smokeless tobacco: Never Used  Vaping Use  . Vaping Use: Never used  Substance and Sexual Activity  . Alcohol use: No  . Drug use: No  . Sexual activity: Never  Other Topics Concern  . Not on file  Social History Narrative   Lives in apartment at a senior living area. Socializes everday.   Social Determinants of Health   Financial Resource Strain:   . Difficulty of Paying Living Expenses: Not on file  Food Insecurity:   . Worried About Charity fundraiser in the Last Year: Not on file  . Ran Out of Food in the Last Year: Not on file  Transportation Needs:   . Lack of Transportation (Medical): Not on file  . Lack of Transportation (Non-Medical): Not on file  Physical Activity:   . Days of Exercise per Week: Not on file  . Minutes of Exercise per Session: Not on file  Stress:   . Feeling of Stress : Not on file  Social Connections:   . Frequency of Communication with Friends and Family: Not on file  . Frequency of Social Gatherings with Friends and Family: Not on file  .  Attends Religious Services: Not on file  . Active Member of Clubs or Organizations: Not on file  . Attends Archivist Meetings: Not on file  . Marital Status: Not on file    Tobacco Counseling Counseling given: Not Answered   Clinical Intake:  Pre-visit preparation completed: Yes  Pain : 0-10 Pain Score: 5  Pain Type: Chronic pain Pain Descriptors / Indicators: Aching Pain Onset: Other (comment) (chronic joint pain) Pain Frequency: Constant Pain Relieving Factors: tylenol  Pain Relieving Factors: tylenol  BMI - recorded: 45.36 Nutritional Status: BMI > 30  Obese Nutritional Risks: None  How often do you need to have  someone help you when you read instructions, pamphlets, or other written materials from your doctor or pharmacy?: 1 - Never  Diabetic? No  Interpreter Needed?: No  Information entered by :: S. Jendaya Gossett, ANP-c   Activities of Daily Living No flowsheet data found.  Patient Care Team: Hali Marry, MD as PCP - General (Family Medicine)  Indicate any recent Medical Services you may have received from other than [facility] providers in the past year (date may be approximate).     Assessment:   This is a routine wellness examination for [place].  Hearing/Vision screen No exam data present  Dietary issues and exercise activities discussed: Current Exercise Habits: Home exercise routine (Strecthing exercises)  Goals    .  Exercise 150 min/wk Moderate Activity      Once back pain is resolved and feet pain is better will start back exercising and walking. Was walking 1 mile before this started happening.    .  Exercise 3x per week (30 min per time) (pt-stated)      She would like to participate in a structured exercise program such as Silver Sneakers or water aerobics a few times per week at the [facility].    [name]  Exercise 3x per week (30 min per time)      Would like to get on track and try and walk some this year.      Depression Screen PHQ 2/9  Scores 06/14/2019 11/21/2018 06/14/2018 01/11/2018 11/16/2017 07/29/2017 04/08/2017  PHQ - 2 Score 1 0 0 3 0 0 0  PHQ- 9 Score - - - 7 - - 2    Fall Risk Fall Risk  06/14/2019 06/14/2019 12/14/2018 11/21/2018 11/16/2017  Falls in the past year? 1 1 0 0 No  Number falls in past yr: 0 0 0 0 -  Injury with Fall? 0 0 0 0 -  Risk for fall due to : Impaired mobility Impaired mobility - - Impaired balance/gait  Follow up Falls prevention discussed Falls prevention discussed - Falls prevention discussed -    Any stairs in or around the home? No  If so, are there any without handrails? N/A Home free of loose throw rugs in walkways, pet beds, electrical cords, etc? No  Adequate lighting in your home to reduce risk of falls? Yes   ASSISTIVE DEVICES UTILIZED TO PREVENT FALLS:  Life alert? Yes  Use of a cane, walker or w/c? Yes  Grab bars in the bathroom? Yes  Shower chair or bench in shower? Yes  Elevated toilet seat or a handicapped toilet? No   TIMED UP AND GO:  Was the test performed? Yes .  Length of time to ambulate 10 feet: 10 sec.   Gait slow and steady with assistive device  Cognitive Function:     6CIT Screen 11/23/2019 11/21/2018 11/16/2017 03/23/2016  What Year? 0 points 0 points 0 points 0 points  What month? 0 points 0 points 0 points 0 points  What time? 0 points 0 points 0 points 0 points  Count back from 20 0 points 0 points 0 points -  Months in reverse 0 points 0 points 0 points 0 points  Repeat phrase 6 points 0 points 0 points 0 points  Total Score 6 0 0 -    Immunizations Immunization History  Administered Date(s) Administered  . Fluad Quad(high Dose 65+) 11/21/2018  . Influenza Split 12/24/2010  . Influenza, High Dose Seasonal PF 11/16/2017  . Influenza,inj,Quad PF,6+ Mos 11/29/2012, 10/04/2013, 11/16/2014, 11/21/2015, 10/13/2016,  11/23/2019  . Moderna SARS-COVID-2 Vaccination 06/01/2019, 07/03/2019  . Td 07/04/2010    TDAP status: Up to date Flu Vaccine status:  Completed at today's visit Pneumococcal vaccine status: Declined,  Education has been provided regarding the importance of this vaccine but patient still declined. Advised may receive this vaccine at local pharmacy or Health Dept. Aware to provide a copy of the vaccination record if obtained from local pharmacy or Health Dept. Verbalized acceptance and understanding.  Covid-19 vaccine status: Completed vaccines  Qualifies for Shingles Vaccine? Yes   Zostavax completed No   Shingrix Completed?: No.    Education has been provided regarding the importance of this vaccine. Patient has been advised to call insurance company to determine out of pocket expense if they have not yet received this vaccine. Advised may also receive vaccine at local pharmacy or Health Dept. Verbalized acceptance and understanding.  Screening Tests Health Maintenance  Topic Date Due  . TETANUS/TDAP  07/03/2020  . COLONOSCOPY  10/09/2020  . MAMMOGRAM  12/13/2020  . INFLUENZA VACCINE  Completed  . COVID-19 Vaccine  Completed  . Hepatitis C Screening  Completed  . HIV Screening  Discontinued    Health Maintenance  There are no preventive care reminders to display for this patient.  Colorectal cancer screening: Completed 2018. Repeat every 10 years Mammogram status: Completed Next week. Repeat every year Bone Density status: Ordered today. Pt provided with contact info and advised to call to schedule appt.  Lung Cancer Screening: (Low Dose CT Chest recommended if Age 3-80 years, 30 pack-year currently smoking OR have quit w/in 15years.) does not qualify.   Lung Cancer Screening Referral: No   Additional Screening:  Hepatitis C Screening: does not qualify; Completed   Vision Screening: Recommended annual ophthalmology exams for [place] detection of glaucoma and other disorders of the eye. Is the patient up to date with their annual eye exam?  Yes   If pt is not established with a provider, would they like to be  referred to a provider to establish care? Provider in place.   Dental Screening: Recommended annual dental exams for proper oral hygiene  Community Resource Referral / Chronic Care Management: CRR required this visit?  No   CCM required this visit?  No      Plan:     I have personally reviewed and noted the following in the patient's chart:   . Medical and social history . Use of alcohol, tobacco or illicit drugs  . Current medications and supplements . Functional ability and status . Nutritional status . Physical activity . Advanced directives . List of other physicians . Hospitalizations, surgeries, and ER visits in previous 12 months . Vitals . Screenings to include cognitive, depression, and falls . Referrals and appointments  In addition, I have reviewed and discussed with patient certain preventive protocols, quality metrics, and best practice recommendations. A written personalized care plan for preventive services as well as general preventive health recommendations were provided to patient.     Orma Render, NP   11/23/2019

## 2019-11-29 ENCOUNTER — Ambulatory Visit (INDEPENDENT_AMBULATORY_CARE_PROVIDER_SITE_OTHER): Payer: Medicare HMO | Admitting: Sports Medicine

## 2019-11-29 ENCOUNTER — Other Ambulatory Visit: Payer: Self-pay

## 2019-11-29 DIAGNOSIS — M170 Bilateral primary osteoarthritis of knee: Secondary | ICD-10-CM | POA: Diagnosis not present

## 2019-11-29 MED ORDER — DICLOFENAC SODIUM 2 % EX SOLN: 1.0000 | Freq: Two times a day (BID) | CUTANEOUS | 11 refills | Status: DC

## 2019-11-29 NOTE — Assessment & Plan Note (Signed)
This is a pleasant 64 year old female with bilateral knee osteoarthritis. She had little bit of relief with Pennsaid (diclofenac 2% topical), she declines tramadol, Tylenol 3, she declines aquatic therapy, formal physical therapy, she declines injections. We tried an unloader brace which she did not tolerate and will be returning today. I am going to refill her Pennsaid (diclofenac 2% topical), she can follow-up with me as needed.

## 2019-11-29 NOTE — Progress Notes (Signed)
    Procedures performed today:    None.  Independent interpretation of notes and tests performed by another provider:   None.  Brief History, Exam, Impression, and Recommendations:    Primary osteoarthritis of both knees This is a pleasant 64 year old female with bilateral knee osteoarthritis. She had little bit of relief with Pennsaid (diclofenac 2% topical), she declines tramadol, Tylenol 3, she declines aquatic therapy, formal physical therapy, she declines injections. We tried an unloader brace which she did not tolerate and will be returning today. I am going to refill her Pennsaid (diclofenac 2% topical), she can follow-up with me as needed.    ___________________________________________ Gwen Her. Dianah Field, M.D., ABFM., CAQSM. Primary Care and [place] Instructor of [employer] of [facility] of Medicine

## 2019-12-09 ENCOUNTER — Other Ambulatory Visit: Payer: Self-pay | Admitting: Family Medicine

## 2019-12-14 ENCOUNTER — Other Ambulatory Visit: Payer: Self-pay

## 2019-12-14 ENCOUNTER — Encounter: Payer: Self-pay | Admitting: Family Medicine

## 2019-12-14 ENCOUNTER — Ambulatory Visit (INDEPENDENT_AMBULATORY_CARE_PROVIDER_SITE_OTHER): Payer: Medicare HMO

## 2019-12-14 ENCOUNTER — Telehealth: Payer: Self-pay | Admitting: *Deleted

## 2019-12-14 ENCOUNTER — Ambulatory Visit (INDEPENDENT_AMBULATORY_CARE_PROVIDER_SITE_OTHER): Payer: Medicare HMO | Admitting: Family Medicine

## 2019-12-14 VITALS — BP 120/82 | HR 58 | Ht 62.0 in | Wt 250.0 lb

## 2019-12-14 DIAGNOSIS — Z1231 Encounter for screening mammogram for malignant neoplasm of breast: Secondary | ICD-10-CM

## 2019-12-14 DIAGNOSIS — I10 Essential (primary) hypertension: Secondary | ICD-10-CM

## 2019-12-14 DIAGNOSIS — R7301 Impaired fasting glucose: Secondary | ICD-10-CM | POA: Diagnosis not present

## 2019-12-14 DIAGNOSIS — N1831 Chronic kidney disease, stage 3a: Secondary | ICD-10-CM

## 2019-12-14 DIAGNOSIS — Z13220 Encounter for screening for lipoid disorders: Secondary | ICD-10-CM

## 2019-12-14 DIAGNOSIS — L723 Sebaceous cyst: Secondary | ICD-10-CM

## 2019-12-14 LAB — POCT GLYCOSYLATED HEMOGLOBIN (HGB A1C): Hemoglobin A1C: 6 % — AB (ref 4.0–5.6)

## 2019-12-14 MED ORDER — CEPHALEXIN 500 MG PO CAPS: 500.0000 mg | ORAL_CAPSULE | Freq: Two times a day (BID) | ORAL | 0 refills | Status: DC

## 2019-12-14 NOTE — Assessment & Plan Note (Signed)
Well controlled. Continue current regimen. Follow up in  6 mo  

## 2019-12-14 NOTE — Assessment & Plan Note (Signed)
Ck renal func

## 2019-12-14 NOTE — Telephone Encounter (Signed)
We did briefly discuss tinea versicolor as she was leaving the office from her AWV.[name] She can use the Nizoral shampoo as a body wash to the affected areas three times a week until the spots have resolved. If they are still present after 4-6 weeks of consistent use, then we will need to have her come in to have them looked at. Given the nature of the visit, I did not scrape or biopsy the areas.

## 2019-12-14 NOTE — Assessment & Plan Note (Signed)
A1c is up slightly to 6.0 compared to previous of 5.7.  Urged her to continue to work on [employer] and regular exercise to try to get that back down over the next 6 months.

## 2019-12-14 NOTE — Progress Notes (Signed)
Established Patient Office Visit  Subjective:  Patient ID: Sharon Reilly, female    DOB: 11-Jul-1955  Age: 64 y.o. MRN: [id number]  CC:  Chief Complaint  Patient presents with  . Hypertension  . ifg    HPI [name] presents for   Hypertension- Pt denies chest pain, SOB, dizziness, or heart palpitations.  Taking meds as directed w/o problems.  Denies medication side effects.    Impaired fasting glucose-no increased thirst or urination. No symptoms consistent with hypoglycemia.  He also complains of some urinary urgency for about 6 months or so.  Just noticing that when she feels like she has to go she needs to go immediately or can leak urine.  She denies any dysuria or other symptoms.  She has not had any previous work-up for this.  She also has had problems with getting cysts in the groin area and in the rectal area.  She says she has not really been bothered by it in a while but noticed recently on the left labia that there is a new tender spot she noticed it a few days ago but says it does feel a little better than it did even just a couple days ago.   Past Medical History:  Diagnosis Date  . Fibromyalgia   . Hyperlipidemia   . Hypertension   . Lupus ([place])   . Lupus (systemic lupus erythematosus) ([place])   . NSTEMI (non-ST elevated myocardial infarction) ([name]) 03/08/2016   [facility]  . Obesity   . Osteoarthritis   . Osteoporosis     Past Surgical History:  Procedure Laterality Date  . ABDOMINAL HYSTERECTOMY     partial  . BREAST SURGERY     reduction  . CARPAL TUNNEL RELEASE     left  . CHOLECYSTECTOMY Bilateral 05/14/2016  . epicondylitis     left  . fistulotomy    . great toe fusion     left  . MRI syr ortho spec     Dr. Christen Butter  . REDUCTION MAMMAPLASTY    . right elbow    . surgisis biodesign fistula plug    . talonavicular joint fusion     LT foot    Family History  Problem Relation Age of Onset  . Breast cancer Mother        Diagnosed in her  26's.  [name] Hyperlipidemia Mother   . Hypertension Mother   . Hyperlipidemia Father   . Diabetes Brother   . Diabetes Other        aunt  . Kidney failure Brother   . Thyroid disease Sister     Social History   Socioeconomic History  . Marital status: Single    Spouse name: Not on file  . Number of children: 1  . Years of education: 2 years college  . Highest education level: 12th grade  Occupational History  . Occupation: disabled    Comment: insurance  Tobacco Use  . Smoking status: Never Smoker  . Smokeless tobacco: Never Used  Vaping Use  . Vaping Use: Never used  Substance and Sexual Activity  . Alcohol use: No  . Drug use: No  . Sexual activity: Never  Other Topics Concern  . Not on file  Social History Narrative   Lives in apartment at a senior living area. Socializes everday.   Social Determinants of Health   Financial Resource Strain: Low Risk   . Difficulty of Paying Living Expenses: Not hard at all  Food  Insecurity: No Food Insecurity  . Worried About Charity fundraiser in the Last Year: Never true  . Ran Out of Food in the Last Year: Never true  Transportation Needs: No Transportation Needs  . Lack of Transportation (Medical): No  . Lack of Transportation (Non-Medical): No  Physical Activity: Insufficiently Active  . Days of Exercise per Week: 1 day  . Minutes of Exercise per Session: 10 min  Stress: No Stress Concern Present  . Feeling of Stress : Not at all  Social Connections: Unknown  . Frequency of Communication with Friends and Family: More than three times a week  . Frequency of Social Gatherings with Friends and Family: More than three times a week  . Attends Religious Services: More than 4 times per year  . Active Member of Clubs or Organizations: Yes  . Attends Archivist Meetings: More than 4 times per year  . Marital Status: Patient refused  Intimate Partner Violence: Not At Risk  . Fear of Current or Ex-Partner: No  .  Emotionally Abused: No  . Physically Abused: No  . Sexually Abused: No    Outpatient Medications Prior to Visit  Medication Sig Dispense Refill  . acetaminophen (TYLENOL) 650 MG CR tablet Take 1 tablet (650 mg total) by mouth every 8 (eight) hours as needed for pain. 90 tablet 3  . albuterol (PROVENTIL HFA;VENTOLIN HFA) 108 (90 Base) MCG/ACT inhaler Inhale 2 puffs into the lungs every 6 (six) hours as needed for wheezing or shortness of breath. 1 Inhaler 2  . AMBULATORY NON FORMULARY MEDICATION Medication Name: Call Huey Romans to get a download off her CPAP and fax to our office.  [phone] 1 Units 0  . aspirin 81 MG tablet Take 81 mg by mouth daily.      . Diclofenac Sodium 2 % SOLN Place 1 spray onto the skin 2 (two) times daily. 112 g 11  . fexofenadine (ALLEGRA) 180 MG tablet Take 1 tablet (180 mg total) by mouth daily. (Patient taking differently: Take 180 mg by mouth as needed. ) 30 tablet 6  . fluticasone (FLOVENT HFA) 110 MCG/ACT inhaler Inhale 1 puff into the lungs daily as needed.    . gabapentin (NEURONTIN) 100 MG capsule Take 200 mg by mouth daily.    [name] losartan-hydrochlorothiazide (HYZAAR) 100-25 MG tablet TAKE ONE TABLET BY MOUTH EVERY DAY 90 tablet 0  . metoprolol tartrate (LOPRESSOR) 100 MG tablet Take 1 tablet (100 mg total) by mouth 2 (two) times daily. 180 tablet 1  . Multiple Vitamins-Minerals (CENTRUM SILVER 50+WOMEN) TABS Take 1 tablet by mouth daily.    [name] omeprazole (PRILOSEC) 40 MG capsule Take 1 capsule (40 mg total) by mouth 2 (two) times daily. 180 capsule 2  . rosuvastatin (CRESTOR) 10 MG tablet Take 1 tablet (10 mg total) by mouth daily. 90 tablet 3   No facility-administered medications prior to visit.    Allergies  Allergen Reactions  . Amoxicillin Hives and Itching    No rash.   . Erythromycin Hives  . Nsaids Other (See Comments)    CKD 3   . Sulfonamide Derivatives   . Doxycycline Hives  . Sulfa Antibiotics Hives and Rash    ROS Review of  Systems    Objective:    Physical Exam Constitutional:      Appearance: She is well-developed.  HENT:     Head: Normocephalic and atraumatic.  Cardiovascular:     Rate and Rhythm: Normal rate and regular rhythm.  Heart sounds: Normal heart sounds.  Pulmonary:     Effort: Pulmonary effort is normal.     Breath sounds: Normal breath sounds.  Genitourinary:    Comments: On the left labia posteriorly she has a palpable approximately 1 cm cystic lesion it is tender to touch.  But no significant area of induration or erythema.  No active drainage. Skin:    General: Skin is warm and dry.  Neurological:     Mental Status: She is alert and oriented to person, place, and time.  Psychiatric:        Behavior: Behavior normal.     BP 120/82   Pulse (!) 58   Ht 5\' 2"  (1.575 m)   Wt 250 lb (113.4 kg)   SpO2 96%   BMI 45.73 kg/m  Wt Readings from Last 3 Encounters:  12/14/19 250 lb (113.4 kg)  11/23/19 248 lb (112.5 kg)  06/14/19 255 lb (115.7 kg)     There are no preventive care reminders to display for this patient.  There are no preventive care reminders to display for this patient.  Lab Results  Component Value Date   TSH 3.01 05/22/2015   Lab Results  Component Value Date   WBC 4.2 07/16/2018   HGB 11.4 (A) 07/16/2018   HCT 34 (A) 07/16/2018   MCV 93.2 07/16/2018   PLT 280 01/14/2017   Lab Results  Component Value Date   NA 143 06/14/2019   K 4.3 06/14/2019   CO2 29 06/14/2019   GLUCOSE 92 06/14/2019   BUN 24 06/14/2019   CREATININE 1.30 (H) 06/14/2019   BILITOT 0.4 08/01/2018   ALKPHOS 76 09/29/2016   AST 18 08/01/2018   ALT 22 08/01/2018   PROT 6.7 08/01/2018   ALBUMIN 4.2 07/16/2018   CALCIUM 9.9 06/14/2019   Lab Results  Component Value Date   CHOL 177 08/01/2018   Lab Results  Component Value Date   HDL 41 (L) 08/01/2018   Lab Results  Component Value Date   LDLCALC 110 (H) 08/01/2018   Lab Results  Component Value Date   TRIG 138  08/01/2018   Lab Results  Component Value Date   CHOLHDL 4.3 08/01/2018   Lab Results  Component Value Date   HGBA1C 6.0 (A) 12/14/2019      Assessment & Plan:   Problem List Items Addressed This Visit      Cardiovascular and Mediastinum   Essential hypertension, benign - Primary    Well controlled. Continue current regimen. Follow up in  6 mo      Relevant Orders   POCT glycosylated hemoglobin (Hb A1C) (Completed)   COMPLETE METABOLIC PANEL WITH GFR   Lipid Panel w/reflex Direct LDL     Endocrine   IFG (impaired fasting glucose)    A1c is up slightly to 6.0 compared to previous of 5.7.  Urged her to continue to work on [employer] and regular exercise to try to get that back down over the next 6 months.      Relevant Orders   POCT glycosylated hemoglobin (Hb A1C) (Completed)   COMPLETE METABOLIC PANEL WITH GFR   Lipid Panel w/reflex Direct LDL   Urine Microalbumin w/creat. ratio     Genitourinary   CKD (chronic kidney disease) stage 3, GFR 30-59 ml/min (HCC)    Ck renal func      Relevant Orders   Urine Microalbumin w/creat. ratio    Other Visit Diagnoses    Screening, lipid  Relevant Orders   POCT glycosylated hemoglobin (Hb A1C) (Completed)   COMPLETE METABOLIC PANEL WITH GFR   Lipid Panel w/reflex Direct LDL   Inflamed epidermoid cyst of skin         Inflamed epidermal cyst-for now we will treat with antibiotics she already feels like it is getting a little bit better.  We did discuss that if it does not resolve that it will likely need to be excised.  These can recur.  Treat with Keflex since has penicillin, sulfa and Doxy allergy.  Meds ordered this encounter  Medications  . cephALEXin (KEFLEX) 500 MG capsule    Sig: Take 1 capsule (500 mg total) by mouth 2 (two) times daily.    Dispense:  14 capsule    Refill:  0    Follow-up: Return in about 6 months (around 06/13/2020) for Hypertension and Prediabetes.    Beatrice Lecher, MD

## 2019-12-14 NOTE — Telephone Encounter (Signed)
Pt stated that when she was seen for her [place] 11/23/2019 she was told to use the Nizoral for the "white spots" on her upper legs/thighs. She stated that she wasn't clear about how long she should be using this.  I checked her OV for a note regarding this and did not see anything to let her know. I told her that I would send a message and we would get back with her regarding the directions for use.

## 2019-12-14 NOTE — Telephone Encounter (Signed)
Pt advised of recommendations. She voiced understanding and agreed.

## 2019-12-15 LAB — MICROALBUMIN / CREATININE URINE RATIO
Creatinine, Urine: 163 mg/dL (ref 20–275)
Microalb Creat Ratio: 2 mcg/mg creat (ref <30)
Microalb, Ur: 0.3 mg/dL

## 2019-12-15 LAB — COMPLETE METABOLIC PANEL WITH GFR
AG Ratio: 1.5 (calc) (ref 1.0–2.5)
ALT: 12 U/L (ref 6–29)
AST: 20 U/L (ref 10–35)
Albumin: 4.1 g/dL (ref 3.6–5.1)
Alkaline phosphatase (APISO): 87 U/L (ref 37–153)
BUN/Creatinine Ratio: 18 (calc) (ref 6–22)
BUN: 23 mg/dL (ref 7–25)
CO2: 29 mmol/L (ref 20–32)
Calcium: 9.7 mg/dL (ref 8.6–10.4)
Chloride: 106 mmol/L (ref 98–110)
Creat: 1.27 mg/dL — ABNORMAL HIGH (ref 0.50–0.99)
GFR, Est African American: 52 mL/min/{1.73_m2} — ABNORMAL LOW (ref >=60)
GFR, Est Non African American: 45 mL/min/{1.73_m2} — ABNORMAL LOW (ref >=60)
Globulin: 2.8 g/dL (calc) (ref 1.9–3.7)
Glucose, Bld: 92 mg/dL (ref 65–99)
Potassium: 3.9 mmol/L (ref 3.5–5.3)
Sodium: 143 mmol/L (ref 135–146)
Total Bilirubin: 0.4 mg/dL (ref 0.2–1.2)
Total Protein: 6.9 g/dL (ref 6.1–8.1)

## 2019-12-15 LAB — LIPID PANEL W/REFLEX DIRECT LDL
Cholesterol: 191 mg/dL (ref <200)
HDL: 51 mg/dL (ref >=50)
LDL Cholesterol (Calc): 118 mg/dL (calc) — ABNORMAL HIGH
Non-HDL Cholesterol (Calc): 140 mg/dL (calc) — ABNORMAL HIGH (ref <130)
Total CHOL/HDL Ratio: 3.7 (calc) (ref <5.0)
Triglycerides: 116 mg/dL (ref <150)

## 2019-12-20 ENCOUNTER — Other Ambulatory Visit: Payer: Self-pay

## 2019-12-20 ENCOUNTER — Ambulatory Visit (INDEPENDENT_AMBULATORY_CARE_PROVIDER_SITE_OTHER): Payer: Medicare HMO

## 2019-12-20 DIAGNOSIS — E2839 Other primary ovarian failure: Secondary | ICD-10-CM | POA: Diagnosis not present

## 2019-12-20 DIAGNOSIS — Z13820 Encounter for screening for osteoporosis: Secondary | ICD-10-CM | POA: Diagnosis not present

## 2019-12-20 DIAGNOSIS — Z0000 Encounter for general adult medical examination without abnormal findings: Secondary | ICD-10-CM

## 2019-12-20 DIAGNOSIS — Z8739 Personal history of other diseases of the musculoskeletal system and connective tissue: Secondary | ICD-10-CM | POA: Diagnosis not present

## 2019-12-20 DIAGNOSIS — Z780 Asymptomatic menopausal state: Secondary | ICD-10-CM | POA: Diagnosis not present

## 2019-12-20 DIAGNOSIS — Z90710 Acquired absence of both cervix and uterus: Secondary | ICD-10-CM | POA: Diagnosis not present

## 2019-12-22 NOTE — Progress Notes (Signed)
Sharon Reilly,   Your DXA scan results came back and they show that your current bone density scores are normal. We do recommend that you continue to take a calcium and vitamin D supplement (calcium 1000-1200 mg per day and vitamin d 800 iu per day) to help prevent bone loss.   We will plan to repeat the test in 2 years unless there are any new problems.   [name]

## 2019-12-25 ENCOUNTER — Telehealth: Payer: Self-pay

## 2019-12-25 NOTE — Telephone Encounter (Signed)
-----   Message from Orma Render, NP sent at 12/22/2019  5:25 PM EDT ----- [name],   Your DXA scan results came back and they show that your current bone density scores are normal. We do recommend that you continue to take a calcium and vitamin D supplement (calcium 1000-1200 mg per day and vitamin d 800 iu per day) to help prevent bone loss.   We will plan to repeat the test in 2 years unless there are any new problems.   [name]

## 2019-12-25 NOTE — Telephone Encounter (Signed)
Patient aware of the results & advice.

## 2020-01-22 DIAGNOSIS — G4733 Obstructive sleep apnea (adult) (pediatric): Secondary | ICD-10-CM | POA: Diagnosis not present

## 2020-02-21 ENCOUNTER — Telehealth: Payer: Self-pay

## 2020-02-21 NOTE — Telephone Encounter (Signed)
Sharon Reilly call wanting a letter excusing her from jury duty.   Juror # [id number] Date 03/26/2020

## 2020-02-21 NOTE — Telephone Encounter (Signed)
Ok for letter

## 2020-02-22 ENCOUNTER — Encounter: Payer: Self-pay | Admitting: Family Medicine

## 2020-02-22 NOTE — Telephone Encounter (Signed)
Patient advised. Letter is ready for pick up.

## 2020-02-29 ENCOUNTER — Other Ambulatory Visit: Payer: Self-pay | Admitting: Family Medicine

## 2020-03-14 ENCOUNTER — Other Ambulatory Visit: Payer: Self-pay | Admitting: Family Medicine

## 2020-05-06 DIAGNOSIS — G4733 Obstructive sleep apnea (adult) (pediatric): Secondary | ICD-10-CM | POA: Diagnosis not present

## 2020-06-12 ENCOUNTER — Other Ambulatory Visit: Payer: Self-pay | Admitting: Family Medicine

## 2020-06-13 ENCOUNTER — Encounter: Payer: Self-pay | Admitting: Family Medicine

## 2020-06-13 ENCOUNTER — Other Ambulatory Visit: Payer: Self-pay

## 2020-06-13 ENCOUNTER — Ambulatory Visit (INDEPENDENT_AMBULATORY_CARE_PROVIDER_SITE_OTHER): Payer: Medicare HMO | Admitting: Family Medicine

## 2020-06-13 VITALS — BP 126/78 | HR 58 | Ht 62.0 in | Wt 250.0 lb

## 2020-06-13 DIAGNOSIS — Z6841 Body Mass Index (BMI) 40.0 and over, adult: Secondary | ICD-10-CM | POA: Diagnosis not present

## 2020-06-13 DIAGNOSIS — G4733 Obstructive sleep apnea (adult) (pediatric): Secondary | ICD-10-CM

## 2020-06-13 DIAGNOSIS — R7301 Impaired fasting glucose: Secondary | ICD-10-CM | POA: Diagnosis not present

## 2020-06-13 DIAGNOSIS — L989 Disorder of the skin and subcutaneous tissue, unspecified: Secondary | ICD-10-CM | POA: Diagnosis not present

## 2020-06-13 DIAGNOSIS — I10 Essential (primary) hypertension: Secondary | ICD-10-CM

## 2020-06-13 DIAGNOSIS — H6982 Other specified disorders of Eustachian tube, left ear: Secondary | ICD-10-CM | POA: Diagnosis not present

## 2020-06-13 DIAGNOSIS — H9312 Tinnitus, left ear: Secondary | ICD-10-CM

## 2020-06-13 LAB — POCT GLYCOSYLATED HEMOGLOBIN (HGB A1C): Hemoglobin A1C: 5.9 % — AB (ref 4.0–5.6)

## 2020-06-13 MED ORDER — PREDNISONE 20 MG PO TABS: 40.0000 mg | ORAL_TABLET | Freq: Every day | ORAL | 0 refills | Status: DC

## 2020-06-13 NOTE — Progress Notes (Addendum)
Established Patient Office Visit  Subjective:  Patient ID: Sharon Reilly, female    DOB: Nov 19, 1955  Age: 65 y.o. MRN: [id number]  CC:  Chief Complaint  Patient presents with  . Hypertension  . ifg  . Ear Pain    HPI Sharon Reilly presents for   Hypertension- Pt denies chest pain, SOB, dizziness, or heart palpitations.  Taking meds as directed w/o problems.  Denies medication side effects.    Impaired fasting glucose-no increased thirst or urination. No symptoms consistent with hypoglycemia.  Sharon Reilly some skin lesions on her left forearm Sharon Reilly would like me to look at today.  When I last saw her I thought it was sort of a hyperkeratosis and I had her apply her steroid that Sharon Reilly uses for her eczema Sharon Reilly says it really did not do anything.  Sharon Reilly says it does not bother her most of the time sometimes it occasionally feels itchy.  Also complains that her left ear has been bothering her for about a week and a half.  Sharon Reilly says at night particularly when Sharon Reilly lays down Sharon Reilly can hear her heartbeat in her ear Sharon Reilly is also been getting a little bit of ear ringing on and off.  No pain or drainage.  Sharon Reilly says Sharon Reilly has been having a lot of itching but has been doing her nasal steroid spray as well as her oral antihistamine.  Follow-up obstructive sleep apnea-Sharon Reilly reports that Sharon Reilly is using her CPAP machine very regularly but notes that Sharon Reilly feels like it causes a little bit of pressure and congestion in her chest and then will noticed that Sharon Reilly coughs some during the day  Past Medical History:  Diagnosis Date  . Fibromyalgia   . Hyperlipidemia   . Hypertension   . Lupus ([place])   . Lupus (systemic lupus erythematosus) ([place])   . NSTEMI (non-ST elevated myocardial infarction) ([place]) 03/08/2016   [facility]  . Obesity   . Osteoarthritis   . Osteoporosis     Past Surgical History:  Procedure Laterality Date  . ABDOMINAL HYSTERECTOMY     partial  . BREAST SURGERY     reduction  . CARPAL TUNNEL RELEASE      left  . CHOLECYSTECTOMY Bilateral 05/14/2016  . epicondylitis     left  . fistulotomy    . great toe fusion     left  . MRI syr ortho spec     Dr. Christen Butter  . REDUCTION MAMMAPLASTY    . right elbow    . surgisis biodesign fistula plug    . talonavicular joint fusion     LT foot    Family History  Problem Relation Age of Onset  . Breast cancer Mother        Diagnosed in her 3's.  [name] Hyperlipidemia Mother   . Hypertension Mother   . Hyperlipidemia Father   . Diabetes Brother   . Diabetes Other        aunt  . Kidney failure Brother   . Thyroid disease Sister     Social History   Socioeconomic History  . Marital status: Single    Spouse name: Not on file  . Number of children: 1  . Years of education: 2 years college  . Highest education level: 12th grade  Occupational History  . Occupation: disabled    Comment: insurance  Tobacco Use  . Smoking status: Never Smoker  . Smokeless tobacco: Never Used  Vaping Use  . Vaping Use: Never  used  Substance and Sexual Activity  . Alcohol use: No  . Drug use: No  . Sexual activity: Never  Other Topics Concern  . Not on file  Social History Narrative   Lives in apartment at a senior living area. Socializes everday.   Social Determinants of Health   Financial Resource Strain: Low Risk   . Difficulty of Paying Living Expenses: Not hard at all  Food Insecurity: No Food Insecurity  . Worried About Charity fundraiser in the Last Year: Never true  . Ran Out of Food in the Last Year: Never true  Transportation Needs: No Transportation Needs  . Lack of Transportation (Medical): No  . Lack of Transportation (Non-Medical): No  Physical Activity: Insufficiently Active  . Days of Exercise per Week: 1 day  . Minutes of Exercise per Session: 10 min  Stress: No Stress Concern Present  . Feeling of Stress : Not at all  Social Connections: Unknown  . Frequency of Communication with Friends and Family: More than three times a week   . Frequency of Social Gatherings with Friends and Family: More than three times a week  . Attends Religious Services: More than 4 times per year  . Active Member of Clubs or Organizations: Yes  . Attends Archivist Meetings: More than 4 times per year  . Marital Status: Patient refused  Intimate Partner Violence: Not At Risk  . Fear of Current or Ex-Partner: No  . Emotionally Abused: No  . Physically Abused: No  . Sexually Abused: No    Outpatient Medications Prior to Visit  Medication Sig Dispense Refill  . acetaminophen (TYLENOL) 650 MG CR tablet Take 1 tablet (650 mg total) by mouth every 8 (eight) hours as needed for pain. (Patient taking differently: Take 1,300 mg by mouth 2 (two) times daily as needed for pain.) 90 tablet 3  . albuterol (PROVENTIL HFA;VENTOLIN HFA) 108 (90 Base) MCG/ACT inhaler Inhale 2 puffs into the lungs every 6 (six) hours as needed for wheezing or shortness of breath. 1 Inhaler 2  . AMBULATORY NON FORMULARY MEDICATION Medication Name: Call Huey Romans to get a download off her CPAP and fax to our office.  [phone] 1 Units 0  . aspirin 81 MG tablet Take 81 mg by mouth daily.    . Diclofenac Sodium 2 % SOLN Place 1 spray onto the skin 2 (two) times daily. 112 g 11  . fexofenadine (ALLEGRA) 180 MG tablet Take 1 tablet (180 mg total) by mouth daily. (Patient taking differently: Take 180 mg by mouth as needed.) 30 tablet 6  . fluticasone (FLOVENT HFA) 110 MCG/ACT inhaler Inhale 1 puff into the lungs daily as needed.    . hydrochlorothiazide (HYDRODIURIL) 25 MG tablet TAKE ONE TABLET BY MOUTH EVERY DAY. TAKE ALONG WITH LOSARTAN 100MG  TABLET. 90 tablet 0  . losartan (COZAAR) 100 MG tablet TAKE ONE TABLET BY MOUTH EVERY DAY. TAKE ALONG WITH HYDROCHLOROTHIAZIDE 25MG  TABLET. 90 tablet 0  . metoprolol tartrate (LOPRESSOR) 100 MG tablet Take 1 tablet (100 mg total) by mouth 2 (two) times daily. 180 tablet 1  . Multiple Vitamins-Minerals (CENTRUM SILVER 50+WOMEN) TABS  Take 1 tablet by mouth daily.    [name] omeprazole (PRILOSEC) 40 MG capsule Take 1 capsule (40 mg total) by mouth 2 (two) times daily. 180 capsule 2  . rosuvastatin (CRESTOR) 10 MG tablet Take 1 tablet (10 mg total) by mouth daily. 90 tablet 3  . cephALEXin (KEFLEX) 500 MG capsule Take 1 capsule (  500 mg total) by mouth 2 (two) times daily. 14 capsule 0  . gabapentin (NEURONTIN) 100 MG capsule Take 200 mg by mouth daily.    [name] losartan-hydrochlorothiazide (HYZAAR) 100-25 MG tablet TAKE ONE TABLET BY MOUTH EVERY DAY 90 tablet 0   No facility-administered medications prior to visit.    Allergies  Allergen Reactions  . Amoxicillin Hives and Itching    No rash.   . Erythromycin Hives  . Nsaids Other (See Comments)    CKD 3   . Sulfonamide Derivatives   . Doxycycline Hives  . Sulfa Antibiotics Hives and Rash    ROS Review of Systems    Objective:    Physical Exam Constitutional:      Appearance: Sharon Reilly is well-developed.  HENT:     Head: Normocephalic and atraumatic.     Right Ear: Tympanic membrane, ear canal and external ear normal.     Left Ear: Tympanic membrane, ear canal and external ear normal.     Ears:     Comments: Tympanic membrane is clear with a few little bubbles.  There is a piece of wax blocking about a quarter of the eardrum.  No significant erythema no drainage in the canal.    Nose: Nose normal.     Mouth/Throat:     Pharynx: No oropharyngeal exudate or posterior oropharyngeal erythema.  Eyes:     Conjunctiva/sclera: Conjunctivae normal.     Pupils: Pupils are equal, round, and reactive to light.  Neck:     Thyroid: No thyromegaly.  Cardiovascular:     Rate and Rhythm: Normal rate and regular rhythm.     Heart sounds: Normal heart sounds.  Pulmonary:     Effort: Pulmonary effort is normal.     Breath sounds: Normal breath sounds. No wheezing.  Musculoskeletal:     Cervical back: Neck supple.  Lymphadenopathy:     Cervical: No cervical adenopathy.  Skin:     General: Skin is warm and dry.  Neurological:     Mental Status: Sharon Reilly is alert and oriented to person, place, and time.     BP 126/78   Pulse (!) 58   Ht 5\' 2"  (1.575 m)   Wt 250 lb (113.4 kg)   SpO2 98%   BMI 45.73 kg/m  Wt Readings from Last 3 Encounters:  06/13/20 250 lb (113.4 kg)  12/14/19 250 lb (113.4 kg)  11/23/19 248 lb (112.5 kg)     Health Maintenance Due  Topic Date Due  . COVID-19 Vaccine (3 - Booster for Moderna series) 01/03/2020    There are no preventive care reminders to display for this patient.  Lab Results  Component Value Date   TSH 3.01 05/22/2015   Lab Results  Component Value Date   WBC 4.2 07/16/2018   HGB 11.4 (A) 07/16/2018   HCT 34 (A) 07/16/2018   MCV 93.2 07/16/2018   PLT 280 01/14/2017   Lab Results  Component Value Date   NA 143 12/14/2019   K 3.9 12/14/2019   CO2 29 12/14/2019   GLUCOSE 92 12/14/2019   BUN 23 12/14/2019   CREATININE 1.27 (H) 12/14/2019   BILITOT 0.4 12/14/2019   ALKPHOS 76 09/29/2016   AST 20 12/14/2019   ALT 12 12/14/2019   PROT 6.9 12/14/2019   ALBUMIN 4.2 07/16/2018   CALCIUM 9.7 12/14/2019   Lab Results  Component Value Date   CHOL 191 12/14/2019   Lab Results  Component Value Date   HDL 51 12/14/2019  Lab Results  Component Value Date   LDLCALC 118 (H) 12/14/2019   Lab Results  Component Value Date   TRIG 116 12/14/2019   Lab Results  Component Value Date   CHOLHDL 3.7 12/14/2019   Lab Results  Component Value Date   HGBA1C 5.9 (A) 06/13/2020      Assessment & Plan:   Problem List Items Addressed This Visit      Cardiovascular and Mediastinum   Essential hypertension, benign - Primary    Well controlled. Continue current regimen. Follow up in  6 mo       Relevant Orders   BASIC METABOLIC PANEL WITH GFR   TSH     Respiratory   OSA (obstructive sleep apnea)    We discussed that we could start but may be getting a download off of her CPAP and maybe even slightly  adjusting her pressure to see if this makes a difference or not.  It may be that we could turn it down just slightly and see if Sharon Reilly notices improvement or it may be that Sharon Reilly is subtherapeutic and it might need to be adjusted upward.  We will start by calling [facility] to see if we can get a download.        Endocrine   IFG (impaired fasting glucose)    Well controlled. Continue current regimen. Follow up in  6 mo       Relevant Orders   POCT glycosylated hemoglobin (Hb A1C) (Completed)   BASIC METABOLIC PANEL WITH GFR   TSH    Other Visit Diagnoses    Skin lesion       Relevant Orders   Ambulatory referral to Dermatology   Tinnitus of left ear       Relevant Medications   predniSONE (DELTASONE) 20 MG tablet   Dysfunction of left eustachian tube          Tinnitus of the left ear-Sharon Reilly does have a little bit of fluid behind the ear.  No erythema to indicate infection Sharon Reilly is already on fluticasone and an oral antihistamine so discussed a trial of prednisone.  Prescription sent to pharmacy.  If not improving over the next week then consider coming back in for an irrigation to remove the piece of wax that could potentially be leaning up against the eardrum.  Skin lesion on her left forearm-it is a cluster of really tiny looking moles I like to refer to dermatology for further work-up.  Meds ordered this encounter  Medications  . predniSONE (DELTASONE) 20 MG tablet    Sig: Take 2 tablets (40 mg total) by mouth daily with breakfast.    Dispense:  10 tablet    Refill:  0    Follow-up: Return in about 6 months (around 12/13/2020) for Hypertension and prediabetes.    Beatrice Lecher, MD

## 2020-06-13 NOTE — Assessment & Plan Note (Signed)
Well controlled. Continue current regimen. Follow up in  6 mo  

## 2020-06-13 NOTE — Assessment & Plan Note (Signed)
We discussed that we could start but may be getting a download off of her CPAP and maybe even slightly adjusting her pressure to see if this makes a difference or not.  It may be that we could turn it down just slightly and see if she notices improvement or it may be that she is subtherapeutic and it might need to be adjusted upward.  We will start by calling [facility] to see if we can get a download.

## 2020-06-17 LAB — BASIC METABOLIC PANEL WITH GFR
BUN/Creatinine Ratio: 23 (calc) — ABNORMAL HIGH (ref 6–22)
BUN: 27 mg/dL — ABNORMAL HIGH (ref 7–25)
CO2: 27 mmol/L (ref 20–32)
Calcium: 9.6 mg/dL (ref 8.6–10.4)
Chloride: 108 mmol/L (ref 98–110)
Creat: 1.19 mg/dL — ABNORMAL HIGH (ref 0.50–0.99)
GFR, Est African American: 56 mL/min/{1.73_m2} — ABNORMAL LOW (ref >=60)
GFR, Est Non African American: 48 mL/min/{1.73_m2} — ABNORMAL LOW (ref >=60)
Glucose, Bld: 91 mg/dL (ref 65–99)
Potassium: 4 mmol/L (ref 3.5–5.3)
Sodium: 143 mmol/L (ref 135–146)

## 2020-06-17 LAB — THYROID PEROXIDASE ANTIBODY: Thyroperoxidase Ab SerPl-aCnc: 22 IU/mL — ABNORMAL HIGH (ref <9)

## 2020-06-17 LAB — TSH: TSH: 0.03 mIU/L — ABNORMAL LOW (ref 0.40–4.50)

## 2020-06-17 LAB — T4, FREE: Free T4: 1.6 ng/dL (ref 0.8–1.8)

## 2020-06-20 DIAGNOSIS — L821 Other seborrheic keratosis: Secondary | ICD-10-CM | POA: Diagnosis not present

## 2020-06-20 DIAGNOSIS — D485 Neoplasm of uncertain behavior of skin: Secondary | ICD-10-CM | POA: Diagnosis not present

## 2020-07-02 ENCOUNTER — Telehealth: Payer: Self-pay | Admitting: Family Medicine

## 2020-07-02 NOTE — Chronic Care Management (AMB) (Signed)
  Chronic Care Management   Note  07/02/2020 Name: TINEY ZIPPER MRN: [id number] DOB: 08/20/1955  TYLAR MERENDINO is a 65 y.o. year old female who is a primary care patient of Madilyn Fireman, Rene Kocher, MD. I reached out to [name] by phone today in response to a referral sent by Ms. Tia Masker Wixted's PCP, Hali Marry, MD.   Ms. Antosh was given information about Chronic Care Management services today including:  1. CCM service includes personalized support from designated clinical staff supervised by her physician, including individualized plan of care and coordination with other care providers 2. 24/7 contact phone numbers for assistance for urgent and routine care needs. 3. Service will only be billed when office clinical staff spend 20 minutes or more in a month to coordinate care. 4. Only one practitioner may furnish and bill the service in a calendar month. 5. The patient may stop CCM services at any time (effective at the end of the month) by phone call to the office staff.   Patient agreed to services and verbal consent obtained.   Follow up plan:   Lauretta Grill Upstream Scheduler

## 2020-07-08 ENCOUNTER — Other Ambulatory Visit: Payer: Self-pay | Admitting: Family Medicine

## 2020-07-17 NOTE — Progress Notes (Signed)
Chronic Care Management Pharmacy Note  07/17/2020 Name:  Sharon Reilly MRN:  [id number] DOB:  [birth year]  Summary: addressed HTN, HLD, pre-DM  Recommendations/Changes made from today's visit: Recommend increase to rosuvastatin 38m, given hx CAD/NSTEMI, LDL 116, and no previous/current intolerances to statin therapy. Candidate for high-intensity statin for secondary prevention.  Plan: Await Dr. MMadilyn Firemandecision for increase statin, vs lifestyle modifications/diet choices. F/u with pharmacist for touchbase in 666month  Subjective: Sharon MIYAMOTOs an [age].o. year old female who is a primary patient of [place], CaRene KocherMD.  The CCM team was consulted for assistance with disease management and care coordination needs.    Engaged with patient by telephone for initial visit in response to provider referral for pharmacy case management and/or care coordination services.   Consent to Services:  The patient was given information about Chronic Care Management services, agreed to services, and gave verbal consent prior to initiation of services.  Please see initial visit note for detailed documentation.   Patient Care Team: MeHali MarryMD as PCP - General (Family Medicine) KlDarius BumpRPSaint [facility] Pharmacist (Pharmacist)  Recent office visits: 06/13/20 - Dr. MeMadilyn FiremanPCP): HTN f/u  [facility] None in previous 6 months  Objective:  Lab Results  Component Value Date   CREATININE 1.19 (H) 06/13/2020   CREATININE 1.27 (H) 12/14/2019   CREATININE 1.30 (H) 06/14/2019    Lab Results  Component Value Date   HGBA1C 5.9 (A) 06/13/2020   Last diabetic Eye exam: No results found for: HMDIABEYEEXA  Last diabetic Foot exam: No results found for: HMDIABFOOTEX      Component Value Date/Time   CHOL 191 12/14/2019 1101   TRIG 116 12/14/2019 1101   HDL 51 12/14/2019 1101   CHOLHDL 3.7 12/14/2019 1101   VLDL 15 11/21/2014 0909   LDLCALC  118 (H) 12/14/2019 1101    Hepatic Function Latest Ref Rng & Units 12/14/2019 08/01/2018 07/16/2018  Total Protein 6.1 - 8.1 g/dL 6.9 6.7 -  Albumin - - - 4.2  AST 10 - 35 U/L 20 18 -  ALT 6 - 29 U/L 12 22 -  Alk Phosphatase 33 - 130 U/L - - -  Total Bilirubin 0.2 - 1.2 mg/dL 0.4 0.4 -  Bilirubin, Direct 0.0 - 0.2 mg/dL - - -    Lab Results  Component Value Date/Time   TSH 0.03 (L) 06/13/2020 12:00 AM   TSH 3.01 05/22/2015 10:27 AM   FREET4 1.6 06/13/2020 12:00 AM   FREET4 1.31 02/23/2014 12:00 PM    CBC Latest Ref Rng & Units 07/16/2018 01/14/2017 08/13/2015  WBC - 4.2 4.4 6.5  Hemoglobin 12.0 - 16.0 11.4(A) 10.2(A) 11.6(L)  Hematocrit 36 - 46 34(A) 30(A) 34.9(L)  Platelets 150 - 399 - 280 287    Lab Results  Component Value Date/Time   VD25OH 20 (L) 05/22/2015 10:27 AM   VD25OH 41 09/24/2010 02:10 PM    Clinical ASCVD: Yes  The ASCVD Risk score (GMikey BussingC Jr., et al., 2013) failed to calculate for the following reasons:   The patient has a prior MI or stroke diagnosis    Other: (CHADS2VASc if Afib, PHQ9 if depression, MMRC or CAT for COPD, ACT, DEXA)  Social History   Tobacco Use  Smoking Status Never Smoker  Smokeless Tobacco Never Used   BP Readings from Last 3 Encounters:  06/13/20 126/78  12/14/19 120/82  11/23/19 (!) 147/83   Pulse Readings  from Last 3 Encounters:  06/13/20 (!) 58  12/14/19 (!) 58  11/23/19 (!) 56   Wt Readings from Last 3 Encounters:  06/13/20 250 lb (113.4 kg)  12/14/19 250 lb (113.4 kg)  11/23/19 248 lb (112.5 kg)    Assessment: Review of patient past medical history, allergies, medications, health status, including review of consultants reports, laboratory and other test data, was performed as part of comprehensive evaluation and provision of chronic care management services.   SDOH:  (Social Determinants of Health) assessments and interventions performed:    CCM Care Plan  Allergies  Allergen Reactions  . Amoxicillin Hives  and Itching    No rash.   . Erythromycin Hives  . Nsaids Other (See Comments)    CKD 3   . Sulfonamide Derivatives   . Doxycycline Hives  . Sulfa Antibiotics Hives and Rash    Medications Reviewed Today    Reviewed by Hali Marry, MD (Physician) on 06/13/20 at 1101  Med List Status: <None>  Medication Order Taking? Sig Documenting Provider Last Dose Status Informant  acetaminophen (TYLENOL) 650 MG CR tablet [id number] Yes Take 1 tablet (650 mg total) by mouth every 8 (eight) hours as needed for pain.  Patient taking differently: Take 1,300 mg by mouth 2 (two) times daily as needed for pain.   Silverio Decamp, MD Taking Active   albuterol (PROVENTIL HFA;VENTOLIN HFA) 108 [phone] Base) MCG/ACT inhaler [id number] Yes Inhale 2 puffs into the lungs every 6 (six) hours as needed for wheezing or shortness of breath. Hali Marry, MD Taking Active   Camillo Flaming MEDICATION [id number] Yes Medication Name: Call Huey Romans to get a download off her CPAP and fax to our office.  [place], MD Taking Active            Med Note Maryruth Eve, Leanna Battles   Wed Jun 14, 2019 10:38 AM)    aspirin 81 MG tablet [id number] Yes Take 81 mg by mouth daily. [provider] Taking Active   Diclofenac Sodium 2 % SOLN [id number] Yes Place 1 spray onto the skin 2 (two) times daily. Silverio Decamp, MD Taking Active   fexofenadine [facility]) 180 MG tablet [id number] Yes Take 1 tablet (180 mg total) by mouth daily.  Patient taking differently: Take 180 mg by mouth as needed.   Bowen, Collene Leyden, DO Taking Active   fluticasone (FLOVENT HFA) 110 MCG/ACT inhaler [id number] Yes Inhale 1 puff into the lungs daily as needed. [provider] Taking Active   hydrochlorothiazide (HYDRODIURIL) 25 MG tablet [id number] Yes TAKE ONE TABLET BY MOUTH EVERY DAY. TAKE ALONG WITH LOSARTAN 100MG  TABLET. Hali Marry, MD Taking Active   losartan (COZAAR) 100 MG tablet  [id number] Yes TAKE ONE TABLET BY MOUTH EVERY DAY. TAKE ALONG WITH HYDROCHLOROTHIAZIDE 25MG  TABLET. Hali Marry, MD Taking Active   metoprolol tartrate (LOPRESSOR) 100 MG tablet [id number] Yes Take 1 tablet (100 mg total) by mouth 2 (two) times daily. Hali Marry, MD Taking Active   Multiple Vitamins-Minerals (CENTRUM SILVER 50+WOMEN) TABS [id number] Yes Take 1 tablet by mouth daily. [provider] Taking Active Self  omeprazole (PRILOSEC) 40 MG capsule [id number] Yes Take 1 capsule (40 mg total) by mouth 2 (two) times daily. Hali Marry, MD Taking Active   predniSONE (DELTASONE) 20 MG tablet [id number] Yes Take 2 tablets (40 mg total) by mouth daily with breakfast. Hali Marry, MD  Active   rosuvastatin (CRESTOR) 10 MG tablet [id number]  Yes Take 1 tablet (10 mg total) by mouth daily. Hali Marry, MD Taking Active           Patient Active Problem List   Diagnosis Date Noted  . Umbilical hernia without obstruction and without gangrene 06/26/2019  . Hallux limitus of right foot 03/02/2019  . Onychomycosis of toenail 12/14/2018  . Eczema 06/14/2018  . CKD (chronic kidney disease) stage 3, GFR 30-59 ml/min (HCC) 04/08/2017  . Secondary hyperparathyroidism of renal origin ([place]) 01/11/2017  . Anemia in stage 3 chronic kidney disease ([place]) 11/18/2016  . Hallux rigidus of right foot 09/29/2016  . Plantar fasciitis of right foot 09/29/2016  . Primary osteoarthritis of both knees 09/29/2016  . Gallstones 05/06/2016  . Pure hypercholesterolemia 03/31/2016  . CAD (coronary artery disease) 03/09/2016  . NSTEMI (non-ST elevated myocardial infarction) ([place]) 03/08/2016  . Internal hemorrhoids 10/14/2015  . Gastroesophageal reflux disease with esophagitis 08/14/2015  . Lymphadenopathy of right cervical region 08/05/2015  . Lumbago with sciatica 11/07/2014  . Left thyroid nodule 02/01/2014  . Nonpalpable mass of neck 02/01/2014  . OSA (obstructive  sleep apnea) 10/04/2013  . Lactose intolerance 05/31/2013  . IFG (impaired fasting glucose) 09/01/2012  . Fibromyalgia 07/10/2010  . Tubular adenoma of colon 07/10/2010  . Anal fistula 07/10/2010  . Sjogren's syndrome ([place]) 06/26/2010  . Essential hypertension, benign 06/26/2010  . Morbid obesity ([place]) 06/26/2010  . HYPERLIPIDEMIA 05/20/2010  . OSTEOPOROSIS 05/20/2010    Immunization History  Administered Date(s) Administered  . Fluad Quad(high Dose 65+) 11/21/2018  . Influenza Split 12/24/2010  . Influenza, High Dose Seasonal PF 11/16/2017  . Influenza,inj,Quad PF,6+ Mos 11/29/2012, 10/04/2013, 11/16/2014, 11/21/2015, 10/13/2016, 11/23/2019  . Moderna Sars-Covid-2 Vaccination 06/01/2019, 07/03/2019  . Td 07/04/2010    Conditions to be addressed/monitored: HTN and HLD  There are no care plans that you recently modified to display for this patient.   Medication Assistance: None required.  Patient affirms current coverage meets needs.  Patient's preferred pharmacy is:  [place], [place] - [address] [place] [address] Phone: [phone] Fax: [phone]  [place], [place] - [address] [place] [address] Phone: [phone] Fax: [phone]  [facility] [place], [place] [address] Phone: [phone] Fax: [phone]  Uses pill box? Yes Pt endorses 100% compliance  Follow Up:  Patient agrees to Care Plan and Follow-up.  Plan: Telephone follow up appointment with care management team member scheduled for:  6 months  Darius Bump

## 2020-07-18 ENCOUNTER — Ambulatory Visit (INDEPENDENT_AMBULATORY_CARE_PROVIDER_SITE_OTHER): Payer: Medicare HMO | Admitting: Pharmacist

## 2020-07-18 DIAGNOSIS — E7800 Pure hypercholesterolemia, unspecified: Secondary | ICD-10-CM | POA: Diagnosis not present

## 2020-07-18 DIAGNOSIS — R7301 Impaired fasting glucose: Secondary | ICD-10-CM

## 2020-07-18 DIAGNOSIS — I10 Essential (primary) hypertension: Secondary | ICD-10-CM | POA: Diagnosis not present

## 2020-07-18 NOTE — Patient Instructions (Signed)
Peytan, it was great meeting you today! As discussed: - Continue striving for healthy diet choices, minimize sugary drinks - Continue exercise as able - We may increase cholesterol medicine dose - We will touch base in 6 months over the phone, but contact the office sooner if you need anything!  [facility]  Visit Information   PATIENT GOALS:  Goals Addressed            This Visit's Progress   . Medication Management       Patient Goals/Self-Care Activities . Over the next 180 days, patient will:  take medications as prescribed  Follow Up Plan: Telephone follow up appointment with care management team member scheduled for:  6 months        Consent to CCM Services: Ms. Yassin was given information about Chronic Care Management services today including:  1. CCM service includes personalized support from designated clinical staff supervised by her physician, including individualized plan of care and coordination with other care providers 2. 24/7 contact phone numbers for assistance for urgent and routine care needs. 3. Service will only be billed when office clinical staff spend 20 minutes or more in a month to coordinate care. 4. Only one practitioner may furnish and bill the service in a calendar month. 5. The patient may stop CCM services at any time (effective at the end of the month) by phone call to the office staff. 6. The patient will be responsible for cost sharing (co-pay) of up to 20% of the service fee (after annual deductible is met).  Patient agreed to services and verbal consent obtained.   Patient verbalizes understanding of instructions provided today and agrees to view in [place].   Telephone follow up appointment with care management team member scheduled for: 6 months  [place]: Patient Care Plan: Medication Management    Problem Identified: HTN, HLD, PreDM     Long-Range Goal: Disease Progression Prevention   Start Date:  07/18/2020  This Visit's Progress: On track  Priority: High  Note:   Current Barriers:  . none at present  Pharmacist Clinical Goal(s):  [name] Over the next 180 days, patient will achieve control of pre-DM & cholesterol as evidenced by diet/lifestyle modification through collaboration with PharmD and provider.   Interventions: . 1:1 collaboration with Hali Marry, MD regarding development and update of comprehensive plan of care as evidenced by provider attestation and co-signature . Inter-disciplinary care team collaboration (see longitudinal plan of care) . Comprehensive medication review performed; medication list updated in electronic medical record  Hypertension: . Controlled; current treatment:losartan-hctz 100- 25mg , metoprolol 100mg  BID;  . Current home readings: doesn't check frequently, but sister checks occasionally &is  well-controlled . Denies hypotensive/hypertensive symptoms . Educated on combination product, dosage strength Recommended continue current regimen    Hyperlipidemia: . Uncontrolled; current treatment: rosuvastatin 10mg ;  . Educated on diet choices mindful of cholesterol management, Recommended increase to rosuvastatin 20mg , given hx CAD/NSTEMI, LDL 116, and no previous/current intolerances to statin therapy. Candidate for high-intensity statin for secondary prevention.   PreDiabetes: . Controlled; current treatment: lifestyle modifications;  [name] Meal patterns:  Breakfast: no breakfast, sometimes [place] sausage + wheat toast Lunch: fast food, salad, [place] sandwhich Dinner: baked chicken, fries, pork chops, ribs, broccoli, beets, zuccini, collard greens Snacks: icecream, chocolate PB wafers Drinks: juicy juice or orange juice, water, unsweet tea . Educated on carbohydrates in moderation, alternative options for fast/convenient meals, aiming for vegetables + lean meats, & minimizing  juicy juice   Patient Goals/Self-Care Activities . Over the next 180  days, patient will:  take medications as prescribed  Follow Up Plan: Telephone follow up appointment with care management team member scheduled for:  6 months

## 2020-07-19 ENCOUNTER — Other Ambulatory Visit: Payer: Self-pay | Admitting: Family Medicine

## 2020-07-19 MED ORDER — ROSUVASTATIN CALCIUM 20 MG PO TABS: 20.0000 mg | ORAL_TABLET | Freq: Every day | ORAL | 3 refills | Status: DC

## 2020-08-04 ENCOUNTER — Other Ambulatory Visit: Payer: Self-pay | Admitting: Family Medicine

## 2020-08-05 ENCOUNTER — Encounter: Payer: Self-pay | Admitting: Family Medicine

## 2020-08-05 ENCOUNTER — Telehealth (INDEPENDENT_AMBULATORY_CARE_PROVIDER_SITE_OTHER): Payer: Medicare HMO | Admitting: Family Medicine

## 2020-08-05 DIAGNOSIS — R300 Dysuria: Secondary | ICD-10-CM | POA: Diagnosis not present

## 2020-08-05 MED ORDER — CIPROFLOXACIN HCL 250 MG PO TABS: 250.0000 mg | ORAL_TABLET | Freq: Two times a day (BID) | ORAL | 0 refills | Status: AC

## 2020-08-05 NOTE — Progress Notes (Signed)
Virtual Video Visit via MyChart Note  I connected with  Sharon Reilly on 08/05/20 at  1:00 PM EDT by the video enabled telemedicine application for MyChart, and verified that I am speaking with the correct person using two identifiers.   I introduced myself as a Designer, jewellery with the practice. We discussed the limitations of evaluation and management by telemedicine and the availability of in person appointments. The patient expressed understanding and agreed to proceed.  Participating parties in this visit include: The patient and the nurse practitioner listed.  The patient is: At home I am: In the office - [place]  Subjective:    CC:  Chief Complaint  Patient presents with   Dysuria    HPI: Sharon Reilly is a 65 y.o. year old female presenting today via [place] today for painful urination.   Patient with 3-4 days of dysuria, 4/10 suprapubic pressure/discomfort (was worse over the weekend and tends to be worse first thing in the morning or when bladder is full), mild mid-low back aching, urinary frequency and urgency. States her urine is light yellow and does not have any unusual odor. She denies small voids, incontinence, hematuria, abdominal/flank pain, fevers, nausea, vomiting, diarrhea, hx of STDs, vaginal symptoms. She has not yet tried anything at home.  States she has trouble finding a ride and it may take several days before she can have someone bring her in to leave a urine sample.     Past medical history, Surgical history, Family history not pertinant except as noted below, Social history, Allergies, and medications have been entered into the medical record, reviewed, and corrections made.   Review of Systems:  All review of systems negative except what is listed in the HPI   Objective:    General:  Speaking clearly in complete sentences. Absent shortness of breath noted.   Alert and oriented x3.   Normal judgment.  Absent acute  distress.   Impression and Recommendations:    1. Dysuria Since she has noticed a slight improvement, recommend she try another day of hydration and cranberry supplements. If still having symptoms, can start Cipro. Placing orders for UA and culture if she is able to have someone bring her by to leave a sample at the lab, but I do not want her to go untreated - she is aware of when to start ABX and signs/symptoms requiring further/urgent evaluation. She declined pyridium.  - ciprofloxacin (CIPRO) 250 MG tablet; Take 1 tablet (250 mg total) by mouth 2 (two) times daily for 3 days.  Dispense: 6 tablet; Refill: 0 - Urinalysis; Future - Urine Culture; Future   Follow-up if symptoms worsen or fail to improve.    I discussed the assessment and treatment plan with the patient. The patient was provided an opportunity to ask questions and all were answered. The patient agreed with the plan and demonstrated an understanding of the instructions.   The patient was advised to call back or seek an in-person evaluation if the symptoms worsen or if the condition fails to improve as anticipated.  I provided 20 minutes of non-face-to-face interaction with this [place] visit including intake, same-day documentation, and chart review.   Terrilyn Saver, NP

## 2020-08-09 ENCOUNTER — Other Ambulatory Visit: Payer: Self-pay

## 2020-08-09 DIAGNOSIS — R300 Dysuria: Secondary | ICD-10-CM

## 2020-08-13 ENCOUNTER — Other Ambulatory Visit: Payer: Self-pay | Admitting: Family Medicine

## 2020-08-13 DIAGNOSIS — R300 Dysuria: Secondary | ICD-10-CM | POA: Diagnosis not present

## 2020-08-14 NOTE — Progress Notes (Signed)
MyChart message sent:  Sharon Reilly, your urine did show some signs of infection, I am waiting on the culture results to determine if the antibiotic I gave you will get rid of this specific infection. You can continue taking it while we wait for results and if we need to make any changes then I will let you know. I hope you are starting to feel better!  Purcell Nails Olevia Bowens, DNP, FNP-C

## 2020-08-15 LAB — URINALYSIS
Bilirubin Urine: NEGATIVE
Glucose, UA: NEGATIVE
Hgb urine dipstick: NEGATIVE
Ketones, ur: NEGATIVE
Nitrite: NEGATIVE
Protein, ur: NEGATIVE
Specific Gravity, Urine: 1.018 (ref 1.001–1.035)
pH: <=5 (ref 5.0–8.0)

## 2020-08-15 LAB — URINE CULTURE
MICRO NUMBER:: 12061677
Result:: NO GROWTH
SPECIMEN QUALITY:: ADEQUATE

## 2020-08-15 NOTE — Progress Notes (Signed)
MyChart message sent:  The urine culture came back "no growth" so there is no need to treat any further. Whether you had one that cleared up by the time you dropped of the sample, or if there was just some external bacteria causing the irritation it is hard to say. Are you feeling better? If not, please come in to the office so we can further evaluate you.

## 2020-08-31 ENCOUNTER — Other Ambulatory Visit: Payer: Self-pay | Admitting: Family Medicine

## 2020-09-09 DIAGNOSIS — H5213 Myopia, bilateral: Secondary | ICD-10-CM | POA: Diagnosis not present

## 2020-09-09 DIAGNOSIS — H25813 Combined forms of age-related cataract, bilateral: Secondary | ICD-10-CM | POA: Diagnosis not present

## 2020-09-09 DIAGNOSIS — H524 Presbyopia: Secondary | ICD-10-CM | POA: Diagnosis not present

## 2020-09-13 ENCOUNTER — Other Ambulatory Visit: Payer: Self-pay | Admitting: Family Medicine

## 2020-09-18 ENCOUNTER — Other Ambulatory Visit: Payer: Self-pay

## 2020-09-18 MED ORDER — LOSARTAN POTASSIUM-HCTZ 100-25 MG PO TABS: 1.0000 | ORAL_TABLET | Freq: Every day | ORAL | 0 refills | Status: DC

## 2020-09-18 NOTE — Telephone Encounter (Signed)
Pt called requesting refill for losartan/hctz.  Rx sent to pharmacy.  Charyl Bigger, CMA

## 2020-10-01 DIAGNOSIS — G4733 Obstructive sleep apnea (adult) (pediatric): Secondary | ICD-10-CM | POA: Diagnosis not present

## 2020-11-01 DIAGNOSIS — I2510 Atherosclerotic heart disease of native coronary artery without angina pectoris: Secondary | ICD-10-CM | POA: Diagnosis not present

## 2020-11-01 DIAGNOSIS — D120 Benign neoplasm of cecum: Secondary | ICD-10-CM | POA: Diagnosis not present

## 2020-11-01 DIAGNOSIS — K648 Other hemorrhoids: Secondary | ICD-10-CM | POA: Diagnosis not present

## 2020-11-01 DIAGNOSIS — Z86010 Personal history of colonic polyps: Secondary | ICD-10-CM | POA: Diagnosis not present

## 2020-11-01 DIAGNOSIS — Z1211 Encounter for screening for malignant neoplasm of colon: Secondary | ICD-10-CM | POA: Diagnosis not present

## 2020-11-01 DIAGNOSIS — D122 Benign neoplasm of ascending colon: Secondary | ICD-10-CM | POA: Diagnosis not present

## 2020-11-01 LAB — HM COLONOSCOPY

## 2020-11-08 ENCOUNTER — Encounter: Payer: Self-pay | Admitting: Family Medicine

## 2020-11-20 ENCOUNTER — Other Ambulatory Visit: Payer: Self-pay | Admitting: Family Medicine

## 2020-11-20 DIAGNOSIS — Z1231 Encounter for screening mammogram for malignant neoplasm of breast: Secondary | ICD-10-CM

## 2020-11-20 DIAGNOSIS — R6889 Other general symptoms and signs: Secondary | ICD-10-CM | POA: Diagnosis not present

## 2020-12-16 ENCOUNTER — Other Ambulatory Visit: Payer: Self-pay | Admitting: Family Medicine

## 2020-12-16 ENCOUNTER — Ambulatory Visit: Payer: Medicare HMO | Admitting: Family Medicine

## 2020-12-18 ENCOUNTER — Other Ambulatory Visit: Payer: Self-pay

## 2020-12-18 ENCOUNTER — Encounter: Payer: Self-pay | Admitting: Family Medicine

## 2020-12-18 ENCOUNTER — Ambulatory Visit (INDEPENDENT_AMBULATORY_CARE_PROVIDER_SITE_OTHER): Payer: Medicare HMO | Admitting: Family Medicine

## 2020-12-18 ENCOUNTER — Ambulatory Visit (INDEPENDENT_AMBULATORY_CARE_PROVIDER_SITE_OTHER): Payer: Medicare HMO

## 2020-12-18 VITALS — BP 118/45 | HR 60 | Ht 62.0 in | Wt 239.0 lb

## 2020-12-18 DIAGNOSIS — E7800 Pure hypercholesterolemia, unspecified: Secondary | ICD-10-CM

## 2020-12-18 DIAGNOSIS — I10 Essential (primary) hypertension: Secondary | ICD-10-CM

## 2020-12-18 DIAGNOSIS — M79644 Pain in right finger(s): Secondary | ICD-10-CM

## 2020-12-18 DIAGNOSIS — G4733 Obstructive sleep apnea (adult) (pediatric): Secondary | ICD-10-CM

## 2020-12-18 DIAGNOSIS — R079 Chest pain, unspecified: Secondary | ICD-10-CM | POA: Diagnosis not present

## 2020-12-18 DIAGNOSIS — N1831 Chronic kidney disease, stage 3a: Secondary | ICD-10-CM | POA: Diagnosis not present

## 2020-12-18 DIAGNOSIS — R7301 Impaired fasting glucose: Secondary | ICD-10-CM | POA: Diagnosis not present

## 2020-12-18 DIAGNOSIS — Z1231 Encounter for screening mammogram for malignant neoplasm of breast: Secondary | ICD-10-CM | POA: Diagnosis not present

## 2020-12-18 DIAGNOSIS — E041 Nontoxic single thyroid nodule: Secondary | ICD-10-CM

## 2020-12-18 DIAGNOSIS — R6889 Other general symptoms and signs: Secondary | ICD-10-CM | POA: Diagnosis not present

## 2020-12-18 DIAGNOSIS — E0590 Thyrotoxicosis, unspecified without thyrotoxic crisis or storm: Secondary | ICD-10-CM

## 2020-12-18 DIAGNOSIS — Z23 Encounter for immunization: Secondary | ICD-10-CM

## 2020-12-18 DIAGNOSIS — R0602 Shortness of breath: Secondary | ICD-10-CM

## 2020-12-18 DIAGNOSIS — G8929 Other chronic pain: Secondary | ICD-10-CM

## 2020-12-18 DIAGNOSIS — N3281 Overactive bladder: Secondary | ICD-10-CM | POA: Insufficient documentation

## 2020-12-18 DIAGNOSIS — M329 Systemic lupus erythematosus, unspecified: Secondary | ICD-10-CM | POA: Diagnosis not present

## 2020-12-18 DIAGNOSIS — N183 Chronic kidney disease, stage 3 unspecified: Secondary | ICD-10-CM | POA: Diagnosis not present

## 2020-12-18 LAB — POCT GLYCOSYLATED HEMOGLOBIN (HGB A1C): Hemoglobin A1C: 5.9 % — AB (ref 4.0–5.6)

## 2020-12-18 MED ORDER — AMBULATORY NON FORMULARY MEDICATION: 0 refills | Status: DC

## 2020-12-18 NOTE — Assessment & Plan Note (Signed)
Following renal function Q 59mo

## 2020-12-18 NOTE — Progress Notes (Signed)
Hi Sharon Reilly, your chest x-ray is normal.

## 2020-12-18 NOTE — Assessment & Plan Note (Signed)
Well controlled. Continue current regimen. Follow up in  6 mo  

## 2020-12-18 NOTE — Progress Notes (Signed)
Please call patient. Normal mammogram.  Repeat in 1 year.  

## 2020-12-18 NOTE — Assessment & Plan Note (Signed)
Discussed options including Kegels.  She said she has tried to do those but just has difficulty doing them so we discussed possible referral to pelvic PT if at some point she feels like that would be helpful.  For right now she is mostly at home so she just manages it herself and she is okay with doing that.  We also discussed that medication is an option.

## 2020-12-18 NOTE — Assessment & Plan Note (Signed)
Most consistent with osteoarthritis of the [facility] joint.  We discussed a trial of Voltaren gel in the mornings to see if this is helpful.  May be even considering a removable brace that she could use from time to time when it is particularly painful or even considering seeing sports medicine for an injection etc.

## 2020-12-18 NOTE — Progress Notes (Signed)
Established Patient Office Visit  Subjective:  Patient ID: Sharon Reilly, female    DOB: 1955-10-15  Age: 65 y.o. MRN: [id number]  CC:  Chief Complaint  Patient presents with   Hypertension   ifg    HPI [name] presents for   Hypertension- Pt denies chest pain, SOB, dizziness, or heart palpitations.  Taking meds as directed w/o problems.  Denies medication side effects.    Impaired fasting glucose-no increased thirst or urination. No symptoms consistent with hypoglycemia.  Feels chest pressure and SOB sometimes.  She has noted it on and off before and says now she is even noticing it sometimes when she is sitting but it is mostly with walking and activity.  She says the pressure almost feels like it is in her upper chest going up into her anterior neck.  She has not noticed any relation to eating or not eating.  Sometimes at night she will have a lot of extra mucus and drainage when she wears her CPAP and will notice a little bit of wheeze but she does not really feel like it is coming from her lungs as it is just drainage and mucus.  She does wear her CPAP every night and says sometimes even that will cause a little bit of chest pressure and discomfort.  Also c/o or urinary frequency and urgency. She says most of the time she is at home and she is able to just go more frequently if she is gone to drive or get in the car or go out she usually just wear a pad.  Its been going on for quite some time but has gotten a little worse.  She is not currently doing any treatment she is does take hydrochlorothiazide and does feel like that probably exacerbates her symptoms.  Also complains of some pain at the base of her right thumb.  She says it usually hurts worse first thing in the morning it will feel stiff and tight and then by about midday it will actually loosen up and feel little better.   Past Medical History:  Diagnosis Date   Fibromyalgia    Hyperlipidemia    Hypertension     Lupus ([place])    Lupus (systemic lupus erythematosus) ([place])    NSTEMI (non-ST elevated myocardial infarction) ([place]) 03/08/2016   [facility]   Obesity    Osteoarthritis    Osteoporosis     Past Surgical History:  Procedure Laterality Date   ABDOMINAL HYSTERECTOMY     partial   CARPAL TUNNEL RELEASE     left   CHOLECYSTECTOMY Bilateral 05/14/2016   epicondylitis     left   fistulotomy     great toe fusion     left   MRI syr ortho spec     Dr. Christen Butter   REDUCTION MAMMAPLASTY Bilateral    right elbow     surgisis biodesign fistula plug     talonavicular joint fusion     LT foot    Family History  Problem Relation Age of Onset   Breast cancer Mother        Diagnosed in her 4's.   Hyperlipidemia Mother    Hypertension Mother    Hyperlipidemia Father    Diabetes Brother    Diabetes Other        aunt   Kidney failure Brother    Thyroid disease Sister     Social History   Socioeconomic History   Marital status: Single  Spouse name: Not on file   Number of children: 1   Years of education: 2 years college   Highest education level: 12th grade  Occupational History   Occupation: disabled    Comment: insurance  Tobacco Use   Smoking status: Never   Smokeless tobacco: Never  Vaping Use   Vaping Use: Never used  Substance and Sexual Activity   Alcohol use: No   Drug use: No   Sexual activity: Never  Other Topics Concern   Not on file  Social History Narrative   Lives in apartment at a senior living area. Socializes everday.   Social Determinants of Health   Financial Resource Strain: Not on file  Food Insecurity: Not on file  Transportation Needs: Not on file  Physical Activity: Not on file  Stress: Not on file  Social Connections: Not on file  Intimate Partner Violence: Not on file    Outpatient Medications Prior to Visit  Medication Sig Dispense Refill   acetaminophen (TYLENOL) 650 MG CR tablet Take 1 tablet (650 mg total) by mouth every 8 (eight)  hours as needed for pain. (Patient taking differently: Take 1,300 mg by mouth 2 (two) times daily as needed for pain.) 90 tablet 3   albuterol (PROVENTIL HFA;VENTOLIN HFA) 108 (90 Base) MCG/ACT inhaler Inhale 2 puffs into the lungs every 6 (six) hours as needed for wheezing or shortness of breath. 1 Inhaler 2   aspirin 81 MG tablet Take 81 mg by mouth daily.     Diclofenac Sodium 2 % SOLN Place 1 spray onto the skin 2 (two) times daily. 112 g 11   fexofenadine (ALLEGRA) 180 MG tablet Take 1 tablet (180 mg total) by mouth daily. (Patient taking differently: Take 180 mg by mouth as needed.) 30 tablet 6   fluticasone (FLOVENT HFA) 110 MCG/ACT inhaler Inhale 1 puff into the lungs daily as needed.     losartan-hydrochlorothiazide (HYZAAR) 100-25 MG tablet Take 1 tablet by mouth daily. 90 tablet 0   metoprolol tartrate (LOPRESSOR) 100 MG tablet Take 1 tablet (100 mg total) by mouth 2 (two) times daily. 180 tablet 1   Multiple Vitamins-Minerals (CENTRUM SILVER 50+WOMEN) TABS Take 1 tablet by mouth daily.     omeprazole (PRILOSEC) 40 MG capsule Take 1 capsule (40 mg total) by mouth 2 (two) times daily. 180 capsule 3   rosuvastatin (CRESTOR) 20 MG tablet Take 1 tablet (20 mg total) by mouth daily. 90 tablet 3   triamcinolone cream (KENALOG) 0.5 % Apply 1 application topically 3 (three) times daily.     AMBULATORY NON FORMULARY MEDICATION Medication Name: Call Huey Romans to get a download off her CPAP and fax to our office.  [phone] 1 Units 0   predniSONE (DELTASONE) 20 MG tablet Take 2 tablets (40 mg total) by mouth daily with breakfast. (Patient not taking: Reported on 07/18/2020) 10 tablet 0   No facility-administered medications prior to visit.    Allergies  Allergen Reactions   Amoxicillin Hives and Itching    No rash.    Erythromycin Hives   Nsaids Other (See Comments)    CKD 3    Sulfonamide Derivatives    Doxycycline Hives   Sulfa Antibiotics Hives and Rash    ROS Review of Systems     Objective:    Physical Exam Constitutional:      Appearance: Normal appearance. She is well-developed.  HENT:     Head: Normocephalic and atraumatic.  Cardiovascular:     Rate and Rhythm:  Normal rate and regular rhythm.     Heart sounds: Normal heart sounds.  Pulmonary:     Effort: Pulmonary effort is normal.     Breath sounds: Normal breath sounds.  Skin:    General: Skin is warm and dry.  Neurological:     Mental Status: She is alert and oriented to person, place, and time.  Psychiatric:        Behavior: Behavior normal.    BP (!) 118/45   Pulse 60   Ht 5\' 2"  (1.575 m)   Wt 239 lb (108.4 kg)   SpO2 98%   BMI 43.71 kg/m  Wt Readings from Last 3 Encounters:  12/18/20 239 lb (108.4 kg)  06/13/20 250 lb (113.4 kg)  12/14/19 250 lb (113.4 kg)     Health Maintenance Due  Topic Date Due   Pneumonia Vaccine 23+ Years old (1 - PCV) Never done   Zoster Vaccines- Shingrix (1 of 2) Never done   COVID-19 Vaccine (3 - Booster for Moderna series) 08/28/2019   TETANUS/TDAP  07/03/2020    There are no preventive care reminders to display for this patient.  Lab Results  Component Value Date   TSH 0.03 (L) 06/13/2020   Lab Results  Component Value Date   WBC 4.2 07/16/2018   HGB 11.4 (A) 07/16/2018   HCT 34 (A) 07/16/2018   MCV 93.2 07/16/2018   PLT 280 01/14/2017   Lab Results  Component Value Date   NA 143 06/13/2020   K 4.0 06/13/2020   CO2 27 06/13/2020   GLUCOSE 91 06/13/2020   BUN 27 (H) 06/13/2020   CREATININE 1.19 (H) 06/13/2020   BILITOT 0.4 12/14/2019   ALKPHOS 76 09/29/2016   AST 20 12/14/2019   ALT 12 12/14/2019   PROT 6.9 12/14/2019   ALBUMIN 4.2 07/16/2018   CALCIUM 9.6 06/13/2020   Lab Results  Component Value Date   CHOL 191 12/14/2019   Lab Results  Component Value Date   HDL 51 12/14/2019   Lab Results  Component Value Date   LDLCALC 118 (H) 12/14/2019   Lab Results  Component Value Date   TRIG 116 12/14/2019   Lab Results   Component Value Date   CHOLHDL 3.7 12/14/2019   Lab Results  Component Value Date   HGBA1C 5.9 (A) 12/18/2020      Assessment & Plan:   Problem List Items Addressed This Visit       Cardiovascular and Mediastinum   Essential hypertension, benign - Primary    Well controlled. Continue current regimen. Follow up in  6 mo       Relevant Orders   Lipid Panel w/reflex Direct LDL   COMPLETE METABOLIC PANEL WITH GFR   TSH + free T4     Respiratory   OSA (obstructive sleep apnea)     Endocrine   Left thyroid nodule   IFG (impaired fasting glucose)    Well controlled. Continue current regimen. Follow up in  6 mo A1C of 5.9.        Relevant Orders   POCT glycosylated hemoglobin (Hb A1C) (Completed)   Lipid Panel w/reflex Direct LDL   COMPLETE METABOLIC PANEL WITH GFR   TSH + free T4     Genitourinary   OAB (overactive bladder)    Discussed options including Kegels.  She said she has tried to do those but just has difficulty doing them so we discussed possible referral to pelvic PT if at some point she  feels like that would be helpful.  For right now she is mostly at home so she just manages it herself and she is okay with doing that.  We also discussed that medication is an option.      CKD (chronic kidney disease) stage 3, GFR 30-59 ml/min (HCC)    Following renal function Q 66mo         Other   Pure hypercholesterolemia   Chronic pain of right thumb    Most consistent with osteoarthritis of the CMC joint.  We discussed a trial of Voltaren gel in the mornings to see if this is helpful.  May be even considering a removable brace that she could use from time to time when it is particularly painful or even considering seeing sports medicine for an injection etc.      Other Visit Diagnoses     Need for immunization against influenza       Relevant Orders   Flu Vaccine QUAD High Dose(Fluad) (Completed)   SOB (shortness of breath)       Relevant Orders   DG Chest 2  View      Atypical chest pain/shortness of breath-I would like to have her come back to schedule for spirometry for further work-up as well as get a chest x-ray today.  Consider cardiac causes as well.  But because of the shortness of breath would like to start with more of a pulmonary work-up.  She is obese which could be contributing to some restrictive lung pattern.  Also discussed possibly turning her CPAP down just slightly to see if that also makes it more comfortable for her to wear at night so she is not getting chest discomfort with wearing her CPAP.  Meds ordered this encounter  Medications   AMBULATORY NON FORMULARY MEDICATION    Sig: Medication Name: Call Huey Romans to get a download off her CPAP and fax to our office.  [phone]    Dispense:  1 Units    Refill:  0     Follow-up: Return in about 4 weeks (around 01/15/2021) for spirometry for further work up of breathing. Beatrice Lecher, MD

## 2020-12-18 NOTE — Assessment & Plan Note (Signed)
Well controlled. Continue current regimen. Follow up in  6 mo A1C of 5.9.

## 2020-12-19 ENCOUNTER — Other Ambulatory Visit: Payer: Self-pay | Admitting: *Deleted

## 2020-12-19 DIAGNOSIS — E0590 Thyrotoxicosis, unspecified without thyrotoxic crisis or storm: Secondary | ICD-10-CM

## 2020-12-19 LAB — COMPLETE METABOLIC PANEL WITH GFR
AG Ratio: 1.3 (calc) (ref 1.0–2.5)
ALT: 14 U/L (ref 6–29)
AST: 17 U/L (ref 10–35)
Albumin: 3.6 g/dL (ref 3.6–5.1)
Alkaline phosphatase (APISO): 77 U/L (ref 37–153)
BUN: 25 mg/dL (ref 7–25)
CO2: 27 mmol/L (ref 20–32)
Calcium: 9.5 mg/dL (ref 8.6–10.4)
Chloride: 105 mmol/L (ref 98–110)
Creat: 1.03 mg/dL (ref 0.50–1.05)
Globulin: 2.8 g/dL (calc) (ref 1.9–3.7)
Glucose, Bld: 93 mg/dL (ref 65–99)
Potassium: 3.8 mmol/L (ref 3.5–5.3)
Sodium: 142 mmol/L (ref 135–146)
Total Bilirubin: 0.3 mg/dL (ref 0.2–1.2)
Total Protein: 6.4 g/dL (ref 6.1–8.1)
eGFR: 60 mL/min/{1.73_m2} (ref >=60)

## 2020-12-19 LAB — LIPID PANEL W/REFLEX DIRECT LDL
Cholesterol: 133 mg/dL (ref <200)
HDL: 39 mg/dL — ABNORMAL LOW (ref >=50)
LDL Cholesterol (Calc): 74 mg/dL (calc)
Non-HDL Cholesterol (Calc): 94 mg/dL (calc) (ref <130)
Total CHOL/HDL Ratio: 3.4 (calc) (ref <5.0)
Triglycerides: 113 mg/dL (ref <150)

## 2020-12-19 LAB — T4, FREE: Free T4: 2.2 ng/dL — ABNORMAL HIGH (ref 0.8–1.8)

## 2020-12-19 LAB — TSH+FREE T4: TSH W/REFLEX TO FT4: <0.01 mIU/L — ABNORMAL LOW (ref 0.40–4.50)

## 2020-12-19 NOTE — Progress Notes (Signed)
Hi Chelsy, LDL cholesterol looks much much better.  It is down almost 40 points which is fantastic.  You also look better hydrated this time and kidney function is better.  In fact its the best its been in over 2 years.  Your liver enzymes are normal.  Your thyroid is showing that it is overactive again.  Would like to send you back to endocrinology again.  I know you saw Dr. Veva Holes years ago.  Would you be okay with seeing him again?  Or do you prefer to see somebody else we actually have a endocrinologist over at her [facility] location who is great as well.  Just let me know

## 2020-12-25 DIAGNOSIS — R6889 Other general symptoms and signs: Secondary | ICD-10-CM | POA: Diagnosis not present

## 2020-12-25 DIAGNOSIS — M069 Rheumatoid arthritis, unspecified: Secondary | ICD-10-CM | POA: Diagnosis not present

## 2020-12-25 DIAGNOSIS — E0590 Thyrotoxicosis, unspecified without thyrotoxic crisis or storm: Secondary | ICD-10-CM | POA: Diagnosis not present

## 2020-12-25 DIAGNOSIS — Z6841 Body Mass Index (BMI) 40.0 and over, adult: Secondary | ICD-10-CM | POA: Diagnosis not present

## 2020-12-25 DIAGNOSIS — M329 Systemic lupus erythematosus, unspecified: Secondary | ICD-10-CM | POA: Diagnosis not present

## 2020-12-27 ENCOUNTER — Telehealth: Payer: Self-pay | Admitting: *Deleted

## 2020-12-27 DIAGNOSIS — G4733 Obstructive sleep apnea (adult) (pediatric): Secondary | ICD-10-CM

## 2020-12-27 MED ORDER — AMBULATORY NON FORMULARY MEDICATION: 0 refills | Status: DC

## 2020-12-27 NOTE — Telephone Encounter (Signed)
Pt reports that Huey Romans is not able to do a download off of her CPAP due to it being so old >5 yrs. She was told to contact our office and inform Dr. Madilyn Fireman of this and let her know that she would need to order a new CPAP.  Called and spoke w/Gail at [facility] and she confirmed that due to the age of Ms. Clairmont's CPAP she would need to have an order to get a new one.   New order for CPAP/supplies and download faxed to [facility]

## 2020-12-29 ENCOUNTER — Other Ambulatory Visit: Payer: Self-pay | Admitting: Family Medicine

## 2021-01-15 ENCOUNTER — Other Ambulatory Visit: Payer: Self-pay

## 2021-01-15 ENCOUNTER — Ambulatory Visit (INDEPENDENT_AMBULATORY_CARE_PROVIDER_SITE_OTHER): Payer: Medicare HMO | Admitting: Family Medicine

## 2021-01-15 VITALS — BP 120/41 | HR 67 | Ht 61.0 in | Wt 238.0 lb

## 2021-01-15 DIAGNOSIS — R0602 Shortness of breath: Secondary | ICD-10-CM | POA: Diagnosis not present

## 2021-01-15 DIAGNOSIS — N1831 Chronic kidney disease, stage 3a: Secondary | ICD-10-CM

## 2021-01-15 DIAGNOSIS — R6889 Other general symptoms and signs: Secondary | ICD-10-CM | POA: Diagnosis not present

## 2021-01-15 LAB — PULMONARY FUNCTION TEST

## 2021-01-15 MED ORDER — ALBUTEROL SULFATE HFA 108 (90 BASE) MCG/ACT IN AERS
4.0000 | INHALATION_SPRAY | Freq: Once | RESPIRATORY_TRACT | Status: AC
Start: 2021-01-15 — End: 2021-01-15
  Administered 2021-01-15: 4 via RESPIRATORY_TRACT

## 2021-01-15 NOTE — Assessment & Plan Note (Signed)
Discussed diagnosis and that she does have some mild impairment to her kidney function and it is important for her other providers to know about this just that they can safely prescribe medications for her also discussed avoiding all NSAIDs.  Okay to use Tylenol.  And its most important that we also keep an eye on this at least every 6 months.

## 2021-01-15 NOTE — Progress Notes (Signed)
Established Patient Office Visit  Subjective:  Patient ID: Sharon Reilly, female    DOB: [birth year]  Age: 65 y.o. MRN: [id number]  CC:  Chief Complaint  Patient presents with   Shortness of Breath    HPI MADORA BARLETTA presents for shortness of breath.  She is here today for spirometry.  She also did a chest x-ray couple of weeks ago which was normal.  This of breath is mostly with activity and exercise.  No known history of COPD or asthma.  He also wanted to discuss the diagnosis of Sjogren's on her problem list.  She says that she saw a rheumatologist here locally when she first moved into the area who thought she might have it but it was never a confirmed diagnosis.  She also wanted to discuss the diagnosis of CKD 3 on her chart and has some questions about what that meant.   Past Medical History:  Diagnosis Date   Fibromyalgia    Hyperlipidemia    Hypertension    Lupus ([place])    Lupus (systemic lupus erythematosus) ([place])    NSTEMI (non-ST elevated myocardial infarction) ([place]) 03/08/2016   [facility]   Obesity    Osteoarthritis    Osteoporosis     Past Surgical History:  Procedure Laterality Date   ABDOMINAL HYSTERECTOMY     partial   CARPAL TUNNEL RELEASE     left   CHOLECYSTECTOMY Bilateral 05/14/2016   epicondylitis     left   fistulotomy     great toe fusion     left   MRI syr ortho spec     Dr. Christen Butter   REDUCTION MAMMAPLASTY Bilateral    right elbow     surgisis biodesign fistula plug     talonavicular joint fusion     LT foot    Family History  Problem Relation Age of Onset   Breast cancer Mother        Diagnosed in her 15's.   Hyperlipidemia Mother    Hypertension Mother    Hyperlipidemia Father    Diabetes Brother    Diabetes Other        aunt   Kidney failure Brother    Thyroid disease Sister     Social History   Socioeconomic History   Marital status: Single    Spouse name: Not on file   Number of children: 1   Years of education: 2  years college   Highest education level: 12th grade  Occupational History   Occupation: disabled    Comment: insurance  Tobacco Use   Smoking status: Never   Smokeless tobacco: Never  Vaping Use   Vaping Use: Never used  Substance and Sexual Activity   Alcohol use: No   Drug use: No   Sexual activity: Never  Other Topics Concern   Not on file  Social History Narrative   Lives in apartment at a senior living area. Socializes everday.   Social Determinants of Health   Financial Resource Strain: Not on file  Food Insecurity: Not on file  Transportation Needs: Not on file  Physical Activity: Not on file  Stress: Not on file  Social Connections: Not on file  Intimate Partner Violence: Not on file    Outpatient Medications Prior to Visit  Medication Sig Dispense Refill   acetaminophen (TYLENOL) 650 MG CR tablet Take 1 tablet (650 mg total) by mouth every 8 (eight) hours as needed for pain. (Patient taking differently: Take 1,300 mg  by mouth 2 (two) times daily as needed for pain.) 90 tablet 3   albuterol (PROVENTIL HFA;VENTOLIN HFA) 108 (90 Base) MCG/ACT inhaler Inhale 2 puffs into the lungs every 6 (six) hours as needed for wheezing or shortness of breath. 1 Inhaler 2   AMBULATORY NON FORMULARY MEDICATION Medication Name: CPAP with mask and supplies and humidifier.CPAP set to 10 cm water pressure. Patient is currently on autopap. Please fax download after 1 week to or office [phone] 1 vial 0   aspirin 81 MG tablet Take 81 mg by mouth daily.     Diclofenac Sodium 2 % SOLN Place 1 spray onto the skin 2 (two) times daily. 112 g 11   fexofenadine (ALLEGRA) 180 MG tablet Take 1 tablet (180 mg total) by mouth daily. (Patient taking differently: Take 180 mg by mouth as needed.) 30 tablet 6   fluticasone (FLOVENT HFA) 110 MCG/ACT inhaler Inhale 1 puff into the lungs daily as needed.     losartan-hydrochlorothiazide (HYZAAR) 100-25 MG tablet Take 1 tablet by mouth daily. 90 tablet 0    metoprolol tartrate (LOPRESSOR) 100 MG tablet Take 1 tablet (100 mg total) by mouth 2 (two) times daily. 180 tablet 1   Multiple Vitamins-Minerals (CENTRUM SILVER 50+WOMEN) TABS Take 1 tablet by mouth daily.     omeprazole (PRILOSEC) 40 MG capsule Take 1 capsule (40 mg total) by mouth 2 (two) times daily. 180 capsule 3   rosuvastatin (CRESTOR) 20 MG tablet Take 1 tablet (20 mg total) by mouth daily. 90 tablet 3   triamcinolone cream (KENALOG) 0.5 % Apply 1 application topically 3 (three) times daily.     No facility-administered medications prior to visit.    Allergies  Allergen Reactions   Amoxicillin Hives and Itching    No rash.    Erythromycin Hives   Nsaids Other (See Comments)    CKD 3    Sulfonamide Derivatives    Doxycycline Hives   Sulfa Antibiotics Hives and Rash    ROS Review of Systems    Objective:    Physical Exam Constitutional:      Appearance: Normal appearance. She is well-developed.  HENT:     Head: Normocephalic and atraumatic.  Pulmonary:     Effort: Pulmonary effort is normal.  Neurological:     Mental Status: She is alert and oriented to person, place, and time.  Psychiatric:        Behavior: Behavior normal.    BP (!) 120/41   Pulse 67   Ht _0  (1.549 m)   Wt 238 lb (108 kg)   SpO2 100%   BMI 44.97 kg/m  Wt Readings from Last 3 Encounters:  01/15/21 238 lb (108 kg)  12/18/20 239 lb (108.4 kg)  06/13/20 250 lb (113.4 kg)     Health Maintenance Due  Topic Date Due   Pneumonia Vaccine 19+ Years old (1 - PCV) Never done   Zoster Vaccines- Shingrix (1 of 2) Never done   TETANUS/TDAP  07/03/2020    There are no preventive care reminders to display for this patient.  Lab Results  Component Value Date   TSH 0.03 (L) 06/13/2020   Lab Results  Component Value Date   WBC 4.2 07/16/2018   HGB 11.4 (A) 07/16/2018   HCT 34 (A) 07/16/2018   MCV 93.2 07/16/2018   PLT 280 01/14/2017   Lab Results  Component Value Date   NA 142  12/18/2020   K 3.8 12/18/2020   CO2 27 12/18/2020  GLUCOSE 93 12/18/2020   BUN 25 12/18/2020   CREATININE 1.03 12/18/2020   BILITOT 0.3 12/18/2020   ALKPHOS 76 09/29/2016   AST 17 12/18/2020   ALT 14 12/18/2020   PROT 6.4 12/18/2020   ALBUMIN 4.2 07/16/2018   CALCIUM 9.5 12/18/2020   EGFR 60 12/18/2020   Lab Results  Component Value Date   CHOL 133 12/18/2020   Lab Results  Component Value Date   HDL 39 (L) 12/18/2020   Lab Results  Component Value Date   LDLCALC 74 12/18/2020   Lab Results  Component Value Date   TRIG 113 12/18/2020   Lab Results  Component Value Date   CHOLHDL 3.4 12/18/2020   Lab Results  Component Value Date   HGBA1C 5.9 (A) 12/18/2020      Assessment & Plan:   Problem List Items Addressed This Visit       Genitourinary   CKD (chronic kidney disease) stage 3, GFR 30-59 ml/min (HCC)    Discussed diagnosis and that she does have some mild impairment to her kidney function and it is important for her other providers to know about this just that they can safely prescribe medications for her also discussed avoiding all NSAIDs.  Okay to use Tylenol.  And its most important that we also keep an eye on this at least every 6 months.      Other Visit Diagnoses     SOB (shortness of breath)    -  Primary   Relevant Medications   albuterol (VENTOLIN HFA) 108 (90 Base) MCG/ACT inhaler 4 puff (Completed)   Other Relevant Orders   PR EVAL OF BRONCHOSPASM       Shortness of breath-spirometry today showed FVC of 101% and an FEV1 of 112%.  Ratio was normal at 85.  Essentially normal spirometry which looks fantastic also normal chest x-ray.  I gave her reassurance that I really do not think that her shortness of breath is related to her lung function I think some of it has to do more with deconditioning and stamina and we discussed working on a graded exercise program to improve her breathing.  Meds ordered this encounter  Medications   albuterol  (VENTOLIN HFA) 108 (90 Base) MCG/ACT inhaler 4 puff    Follow-up: Return if symptoms worsen or fail to improve.    Beatrice Lecher, MD

## 2021-01-23 ENCOUNTER — Ambulatory Visit: Payer: Medicare HMO | Admitting: Pharmacist

## 2021-01-23 ENCOUNTER — Other Ambulatory Visit: Payer: Self-pay

## 2021-01-23 DIAGNOSIS — E7800 Pure hypercholesterolemia, unspecified: Secondary | ICD-10-CM

## 2021-01-23 DIAGNOSIS — R7301 Impaired fasting glucose: Secondary | ICD-10-CM

## 2021-01-23 DIAGNOSIS — I10 Essential (primary) hypertension: Secondary | ICD-10-CM

## 2021-01-23 NOTE — Progress Notes (Signed)
Chronic Care Management Pharmacy Note  01/23/2021 Name:  Sharon Reilly MRN:  [id number] DOB:  25-Feb-1955  Summary: addressed HTN, HLD, pre-DM. Patient is doing well on increased rosuvastatin & 2022 lipid panel looks wonderful.   Recommendations/Changes made from today's visit: No changes at this time, pt doing well.  Plan: f/u with pharmacist in 6-8 months   Subjective: Sharon Reilly is an 65 y.o. year old female who is a primary patient of Metheney, Rene Kocher, MD.  The CCM team was consulted for assistance with disease management and care coordination needs.    Engaged with patient by telephone for follow up visit in response to provider referral for pharmacy case management and/or care coordination services.   Consent to Services:  The patient was given information about Chronic Care Management services, agreed to services, and gave verbal consent prior to initiation of services.  Please see initial visit note for detailed documentation.   Patient Care Team: Hali Marry, MD as PCP - General (Family Medicine) Darius Bump, [facility] as Pharmacist (Pharmacist)  Recent office visits: 06/13/20 - Dr. Madilyn Fireman (PCP): HTN f/u  Recent consult visits: [facility] visits: None in previous 6 months  Objective:  Lab Results  Component Value Date   CREATININE 1.03 12/18/2020   CREATININE 1.19 (H) 06/13/2020   CREATININE 1.27 (H) 12/14/2019    Lab Results  Component Value Date   HGBA1C 5.9 (A) 12/18/2020   Last diabetic Eye exam: No results found for: HMDIABEYEEXA  Last diabetic Foot exam: No results found for: HMDIABFOOTEX      Component Value Date/Time   CHOL 133 12/18/2020 0000   TRIG 113 12/18/2020 0000   HDL 39 (L) 12/18/2020 0000   CHOLHDL 3.4 12/18/2020 0000   VLDL 15 11/21/2014 0909   LDLCALC 74 12/18/2020 0000    Hepatic Function Latest Ref Rng & Units 12/18/2020 12/14/2019 08/01/2018  Total Protein 6.1 - 8.1 g/dL 6.4 6.9 6.7  Albumin - - - -  AST  10 - 35 U/L $Remo'17 20 18  'KtkuQ$ ALT 6 - 29 U/L $Remo'14 12 22  'csYzu$ Alk Phosphatase 33 - 130 U/L - - -  Total Bilirubin 0.2 - 1.2 mg/dL 0.3 0.4 0.4  Bilirubin, Direct 0.0 - 0.2 mg/dL - - -    Lab Results  Component Value Date/Time   TSH 0.03 (L) 06/13/2020 12:00 AM   TSH 3.01 05/22/2015 10:27 AM   FREET4 2.2 (H) 12/18/2020 12:00 AM   FREET4 1.6 06/13/2020 12:00 AM    CBC Latest Ref Rng & Units 07/16/2018 01/14/2017 08/13/2015  WBC - 4.2 4.4 6.5  Hemoglobin 12.0 - 16.0 11.4(A) 10.2(A) 11.6(L)  Hematocrit 36 - 46 34(A) 30(A) 34.9(L)  Platelets 150 - 399 - 280 287    Lab Results  Component Value Date/Time   VD25OH 20 (L) 05/22/2015 10:27 AM   VD25OH 41 09/24/2010 02:10 PM    Clinical ASCVD: Yes  The ASCVD Risk score (Arnett DK, et al., 2019) failed to calculate for the following reasons:   The patient has a prior MI or stroke diagnosis    Other: (CHADS2VASc if Afib, PHQ9 if depression, MMRC or CAT for COPD, ACT, DEXA)  Social History   Tobacco Use  Smoking Status Never  Smokeless Tobacco Never   BP Readings from Last 3 Encounters:  01/15/21 (!) 120/41  12/18/20 (!) 118/45  06/13/20 126/78   Pulse Readings from Last 3 Encounters:  01/15/21 67  12/18/20 60  06/13/20 (!) 58  Wt Readings from Last 3 Encounters:  01/15/21 238 lb (108 kg)  12/18/20 239 lb (108.4 kg)  06/13/20 250 lb (113.4 kg)    Assessment: Review of patient past medical history, allergies, medications, health status, including review of consultants reports, laboratory and other test data, was performed as part of comprehensive evaluation and provision of chronic care management services.   SDOH:  (Social Determinants of Health) assessments and interventions performed:    CCM Care Plan  Allergies  Allergen Reactions   Amoxicillin Hives and Itching    No rash.    Erythromycin Hives   Nsaids Other (See Comments)    CKD 3    Sulfonamide Derivatives    Doxycycline Hives   Sulfa Antibiotics Hives and Rash     Medications Reviewed Today     Reviewed by Hali Marry, MD (Physician) on 01/15/21 at 1214  Med List Status: <None>   Medication Order Taking? Sig Documenting Provider Last Dose Status Informant  acetaminophen (TYLENOL) 650 MG CR tablet [id number] Yes Take 1 tablet (650 mg total) by mouth every 8 (eight) hours as needed for pain.  Patient taking differently: Take 1,300 mg by mouth 2 (two) times daily as needed for pain.   Silverio Decamp, MD Taking Active   albuterol (PROVENTIL HFA;VENTOLIN HFA) 108 [id number] Base) MCG/ACT inhaler [id number] Yes Inhale 2 puffs into the lungs every 6 (six) hours as needed for wheezing or shortness of breath. Hali Marry, MD Taking Active   Camillo Flaming MEDICATION [id number] Yes Medication Name: CPAP with mask and supplies and humidifier.CPAP set to 10 cm water pressure. Patient is currently on autopap. Please fax download after 1 week to or office [phone] Hali Marry, MD Taking Active   aspirin 81 MG tablet [id number] Yes Take 81 mg by mouth daily. [provider] Taking Active   Diclofenac Sodium 2 % SOLN [id number] Yes Place 1 spray onto the skin 2 (two) times daily. Silverio Decamp, MD Taking Active   fexofenadine [facility]) 180 MG tablet [id number] Yes Take 1 tablet (180 mg total) by mouth daily.  Patient taking differently: Take 180 mg by mouth as needed.   Bowen, Collene Leyden, DO Taking Active   fluticasone (FLOVENT HFA) 110 MCG/ACT inhaler [id number] Yes Inhale 1 puff into the lungs daily as needed. [provider] Taking Active   losartan-hydrochlorothiazide (HYZAAR) 100-25 MG tablet [id number] Yes Take 1 tablet by mouth daily. Hali Marry, MD Taking Active   metoprolol tartrate (LOPRESSOR) 100 MG tablet [id number] Yes Take 1 tablet (100 mg total) by mouth 2 (two) times daily. Hali Marry, MD Taking Active   Multiple Vitamins-Minerals (CENTRUM SILVER 50+WOMEN) TABS [id number]  Yes Take 1 tablet by mouth daily. [provider] Taking Active Self  omeprazole (PRILOSEC) 40 MG capsule [id number] Yes Take 1 capsule (40 mg total) by mouth 2 (two) times daily. Hali Marry, MD Taking Active            Med Note Tonny Bollman Jul 18, 2020  9:17 AM) Taking 1 tablet daily  rosuvastatin (CRESTOR) 20 MG tablet [id number] Yes Take 1 tablet (20 mg total) by mouth daily. Hali Marry, MD Taking Active   triamcinolone cream (KENALOG) 0.5 % [id number] Yes Apply 1 application topically 3 (three) times daily. [provider] Taking Active             Patient Active Problem List   Diagnosis Date Noted   OAB (  overactive bladder) 12/18/2020   Chronic pain of right thumb 08/65/7846   Umbilical hernia without obstruction and without gangrene 06/26/2019   Hallux limitus of right foot 03/02/2019   Onychomycosis of toenail 12/14/2018   Eczema 06/14/2018   CKD (chronic kidney disease) stage 3, GFR 30-59 ml/min (HCC) 04/08/2017   Secondary hyperparathyroidism of renal origin ([place]) 01/11/2017   Anemia in stage 3 chronic kidney disease ([place]) 11/18/2016   Hallux rigidus of right foot 09/29/2016   Plantar fasciitis of right foot 09/29/2016   Primary osteoarthritis of both knees 09/29/2016   Gallstones 05/06/2016   Pure hypercholesterolemia 03/31/2016   CAD (coronary artery disease) 03/09/2016   NSTEMI (non-ST elevated myocardial infarction) ([place]) 03/08/2016   Internal hemorrhoids 10/14/2015   Gastroesophageal reflux disease with esophagitis 08/14/2015   Lymphadenopathy of right cervical region 08/05/2015   Lumbago with sciatica 11/07/2014   Left thyroid nodule 02/01/2014   Nonpalpable mass of neck 02/01/2014   OSA (obstructive sleep apnea) 10/04/2013   Lactose intolerance 05/31/2013   IFG (impaired fasting glucose) 09/01/2012   Fibromyalgia 07/10/2010   Tubular adenoma of colon 07/10/2010   Anal fistula 07/10/2010   Essential hypertension,  benign 06/26/2010   Morbid obesity ([place]) 06/26/2010   HYPERLIPIDEMIA 05/20/2010   OSTEOPOROSIS 05/20/2010    Immunization History  Administered Date(s) Administered   Fluad Quad(high Dose 65+) 11/21/2018, 12/18/2020   Influenza Split 12/24/2010   Influenza, High Dose Seasonal PF 11/16/2017   Influenza,inj,Quad PF,6+ Mos 11/29/2012, 10/04/2013, 11/16/2014, 11/21/2015, 10/13/2016, 11/23/2019   Moderna Sars-Covid-2 Vaccination 06/01/2019, 07/03/2019   Td 07/04/2010    Conditions to be addressed/monitored: HTN and HLD  There are no care plans that you recently modified to display for this patient.   Medication Assistance: None required.  Patient affirms current coverage meets needs.  Patient's preferred pharmacy is:  [place], [place] - [address] [place] [address] Phone: [phone] Fax: [phone]  [place], [place] - [address] [place] [address] Phone: [phone] Fax: [phone]  [facility] [place], [place] [address] Phone: [phone] Fax: [phone]  Uses pill box? Yes Pt endorses 100% compliance  Follow Up:  Patient agrees to Care Plan and Follow-up.  Plan: Telephone follow up appointment with care management team member scheduled for:  6 months  Larinda Buttery, PharmD Clinical Pharmacist [facility] [phone]

## 2021-01-23 NOTE — Patient Instructions (Signed)
Visit Information  Thank you for taking time to visit with me today. Please don't hesitate to contact me if I can be of assistance to you before our next scheduled telephone appointment.  Following are the goals we discussed today:   Patient Goals/Self-Care Activities Over the next 180 days, patient will:  take medications as prescribed  Follow Up Plan: Telephone follow up appointment with care management team member scheduled for:  6 months  Please call the care guide team at [phone] if you need to cancel or reschedule your appointment.   Patient verbalizes understanding of instructions provided today and agrees to view in MyChart.   Tre Sanker J Mohannad Olivero  

## 2021-02-21 ENCOUNTER — Telehealth (INDEPENDENT_AMBULATORY_CARE_PROVIDER_SITE_OTHER): Payer: Medicare HMO | Admitting: Family Medicine

## 2021-02-21 ENCOUNTER — Encounter: Payer: Self-pay | Admitting: Family Medicine

## 2021-02-21 VITALS — Temp 98.5°F | Ht 61.0 in | Wt 238.0 lb

## 2021-02-21 DIAGNOSIS — R0602 Shortness of breath: Secondary | ICD-10-CM | POA: Diagnosis not present

## 2021-02-21 DIAGNOSIS — H938X3 Other specified disorders of ear, bilateral: Secondary | ICD-10-CM | POA: Diagnosis not present

## 2021-02-21 DIAGNOSIS — R42 Dizziness and giddiness: Secondary | ICD-10-CM | POA: Diagnosis not present

## 2021-02-21 DIAGNOSIS — M329 Systemic lupus erythematosus, unspecified: Secondary | ICD-10-CM | POA: Diagnosis not present

## 2021-02-21 DIAGNOSIS — Z6841 Body Mass Index (BMI) 40.0 and over, adult: Secondary | ICD-10-CM | POA: Diagnosis not present

## 2021-02-21 MED ORDER — PREDNISONE 20 MG PO TABS: 40.0000 mg | ORAL_TABLET | Freq: Every day | ORAL | 0 refills | Status: DC

## 2021-02-21 MED ORDER — ALBUTEROL SULFATE HFA 108 (90 BASE) MCG/ACT IN AERS: 2.0000 | INHALATION_SPRAY | Freq: Four times a day (QID) | RESPIRATORY_TRACT | 2 refills | Status: AC | PRN

## 2021-02-21 NOTE — Progress Notes (Signed)
Virtual Visit via Video Note  I connected with Sharon Reilly on 02/21/21 at 11:00 AM EST by a video enabled telemedicine application and verified that I am speaking with the correct person using two identifiers.   I discussed the limitations of evaluation and management by telemedicine and the availability of in person appointments. The patient expressed understanding and agreed to proceed.  Patient location: at home Provider location: in office  Subjective:    CC:   Chief Complaint  Patient presents with   Dizziness    Patient complains of dizziness for 1 day, facial pressure for 5 days after having a cold. Patient also complains of ear fullness and a slight productive cough for 1 week.      HPI: She c/o of dizziness and feeling like she is going to fall.  Started yesterday.  She has had it before but only lasted a day. No vision changes.  No head injury  Has also had a cold for about a week.  Ears feels plugged up cough is better.  No fever. No ear drainage.  HA have resolved.  Drainage is better.    Past medical history, Surgical history, Family history not pertinant except as noted below, Social history, Allergies, and medications have been entered into the medical record, reviewed, and corrections made.    Objective:    General: Speaking clearly in complete sentences without any shortness of breath.  Alert and oriented x3.  Normal judgment. No apparent acute distress.    Impression and Recommendations:    Problem List Items Addressed This Visit   None Visit Diagnoses     SOB (shortness of breath)    -  Primary   Relevant Medications   albuterol (VENTOLIN HFA) 108 (90 Base) MCG/ACT inhaler   Dizziness       Pressure sensation in both ears          Dizziness-symptoms consistent with vertigo.  Consider benign positional vertigo versus possible effusion in her ears since she did experience a cold over the last week which seems to be resolving she could have a  secondary effusion causing her symptoms so organ to treat with oral prednisone to see if that helps especially since she has a lot of fullness and plugged sensation in both of her ears as well.  Also recommended that she do the exercises for BPPV.  I described how to do them but encouraged her to go online and actually find the handouts and/or videos to demonstrate how to do them.  If she is not better by the end of next week then please make an appointment to come in so that we can do a formal physical evaluation.  No orders of the defined types were placed in this encounter.   Meds ordered this encounter  Medications   albuterol (VENTOLIN HFA) 108 (90 Base) MCG/ACT inhaler    Sig: Inhale 2 puffs into the lungs every 6 (six) hours as needed for wheezing or shortness of breath.    Dispense:  1 each    Refill:  2   predniSONE (DELTASONE) 20 MG tablet    Sig: Take 2 tablets (40 mg total) by mouth daily with breakfast.    Dispense:  10 tablet    Refill:  0     I discussed the assessment and treatment plan with the patient. The patient was provided an opportunity to ask questions and all were answered. The patient agreed with the plan and demonstrated an  understanding of the instructions.   The patient was advised to call back or seek an in-person evaluation if the symptoms worsen or if the condition fails to improve as anticipated.   Beatrice Lecher, MD

## 2021-02-26 ENCOUNTER — Telehealth: Payer: Self-pay | Admitting: *Deleted

## 2021-02-26 DIAGNOSIS — H938X3 Other specified disorders of ear, bilateral: Secondary | ICD-10-CM

## 2021-02-26 DIAGNOSIS — R42 Dizziness and giddiness: Secondary | ICD-10-CM

## 2021-02-26 NOTE — Telephone Encounter (Signed)
Called and informed her of the referral.

## 2021-02-26 NOTE — Telephone Encounter (Signed)
Pt called and wanted to let Dr. Madilyn Fireman know that she still has ear fullness and vertigo. She has 2 Prednisone left and would like to know what she should do.   Will fwd to pcp for advice.

## 2021-03-12 ENCOUNTER — Ambulatory Visit (INDEPENDENT_AMBULATORY_CARE_PROVIDER_SITE_OTHER): Payer: Medicare HMO | Admitting: Family Medicine

## 2021-03-12 DIAGNOSIS — Z0000 Encounter for general adult medical examination without abnormal findings: Secondary | ICD-10-CM | POA: Diagnosis not present

## 2021-03-12 NOTE — Patient Instructions (Addendum)
Sharon Reilly Maintenance Summary and Written Plan of Care  Sharon Reilly ,  Thank you for allowing me to perform your Medicare Annual Wellness Visit and for your ongoing commitment to your health.   Health Maintenance & Immunization History Health Maintenance  Topic Date Due   Pneumonia Vaccine 67+ Years old (1 - PCV) 04/16/2021 (Originally 07/05/1961)   TETANUS/TDAP  04/16/2021 (Originally 07/03/2020)   Zoster Vaccines- Shingrix (1 of 2) 04/16/2021 (Originally 07/05/2005)   MAMMOGRAM  12/19/2022   COLONOSCOPY (Pts 45-84yrs Insurance coverage will need to be confirmed)  11/01/2025   INFLUENZA VACCINE  Completed   DEXA SCAN  Completed   COVID-19 Vaccine  Completed   Hepatitis C Screening  Completed   HPV VACCINES  Aged Out   HIV Screening  Discontinued   Immunization History  Administered Date(s) Administered   Fluad Quad(high Dose 65+) 11/21/2018, 12/18/2020   Influenza Split 12/24/2010   Influenza, High Dose Seasonal PF 11/16/2017   Influenza,inj,Quad PF,6+ Mos 11/29/2012, 10/04/2013, 11/16/2014, 11/21/2015, 10/13/2016, 11/23/2019   Moderna Covid-19 Vaccine Bivalent Booster 12yrs & up 01/30/2020   Moderna Sars-Covid-2 Vaccination 06/01/2019, 07/03/2019   Td 07/04/2010    These are the patient goals that we discussed:  Goals Addressed              This Visit's Progress     Patient Stated (pt-stated)        03/12/2021 AWV Goal: Exercise for General Health  Patient will verbalize understanding of the benefits of increased physical activity: Exercising regularly is important. It will improve your overall fitness, flexibility, and endurance. Regular exercise also will improve your overall health. It can help you control your weight, reduce stress, and improve your bone density. Over the next year, patient will increase physical activity as tolerated with a goal of at least 150 minutes of moderate physical activity per week.  You can  tell that you are exercising at a moderate intensity if your heart starts beating faster and you start breathing faster but can still hold a conversation. Moderate-intensity exercise ideas include: Walking 1 mile (1.6 km) in about 15 minutes Biking Hiking Golfing Dancing Water aerobics Patient will verbalize understanding of everyday activities that increase physical activity by providing examples like the following: Yard work, such as: Sales promotion account executive Gardening Washing windows or floors Patient will be able to explain general safety guidelines for exercising:  Before you start a new exercise program, talk with your health care provider. Do not exercise so much that you hurt yourself, feel dizzy, or get very short of breath. Wear comfortable clothes and wear shoes with good support. Drink plenty of water while you exercise to prevent dehydration or heat stroke. Work out until your breathing and your heartbeat get faster.         This is a list of Health Maintenance Items that are overdue or due now: Pneumococcal vaccine  Td vaccine Shingrix vaccine  Orders/Referrals Placed Today: No orders of the defined types were placed in this encounter.  (Contact our referral department at [phone] if you have not spoken with someone about your referral appointment within the next 5 days)    Follow-up Plan Follow-up with Hali Marry, MD as planned Schedule your tetanus and Shingrix vaccine at your pharmacy.  Pneumonia vaccine can be done in the office or at your pharmacy. Medicare wellness in one year. AVS printed and mailed to  the patient.      Health Maintenance, Female Adopting a healthy lifestyle and getting preventive care are important in promoting health and wellness. Ask your health care provider about: The right schedule for you to have regular tests and exams. Things you can  do on your own to prevent diseases and keep yourself healthy. What should I know about diet, weight, and exercise? Eat a healthy diet  Eat a diet that includes plenty of vegetables, fruits, low-fat dairy products, and lean protein. Do not eat a lot of foods that are high in solid fats, added sugars, or sodium. Maintain a healthy weight Body mass index (BMI) is used to identify weight problems. It estimates body fat based on height and weight. Your health care provider can help determine your BMI and help you achieve or maintain a healthy weight. Get regular exercise Get regular exercise. This is one of the most important things you can do for your health. Most adults should: Exercise for at least 150 minutes each week. The exercise should increase your heart rate and make you sweat (moderate-intensity exercise). Do strengthening exercises at least twice a week. This is in addition to the moderate-intensity exercise. Spend less time sitting. Even light physical activity can be beneficial. Watch cholesterol and blood lipids Have your blood tested for lipids and cholesterol at 66 years of age, then have this test every 5 years. Have your cholesterol levels checked more often if: Your lipid or cholesterol levels are high. You are older than 66 years of age. You are at high risk for heart disease. What should I know about cancer screening? Depending on your health history and family history, you may need to have cancer screening at various ages. This may include screening for: Breast cancer. Cervical cancer. Colorectal cancer. Skin cancer. Lung cancer. What should I know about heart disease, diabetes, and high blood pressure? Blood pressure and heart disease High blood pressure causes heart disease and increases the risk of stroke. This is more likely to develop in people who have high blood pressure readings or are overweight. Have your blood pressure checked: Every 3-5 years if you are  81-54 years of age. Every year if you are 41 years old or older. Diabetes Have regular diabetes screenings. This checks your fasting blood sugar level. Have the screening done: Once every three years after age 65 if you are at a normal weight and have a low risk for diabetes. More often and at a younger age if you are overweight or have a high risk for diabetes. What should I know about preventing infection? Hepatitis B If you have a higher risk for hepatitis B, you should be screened for this virus. Talk with your health care provider to find out if you are at risk for hepatitis B infection. Hepatitis C Testing is recommended for: Everyone born from [birth year] through 1965. Anyone with known risk factors for hepatitis C. Sexually transmitted infections (STIs) Get screened for STIs, including gonorrhea and chlamydia, if: You are sexually active and are younger than 66 years of age. You are older than 66 years of age and your health care provider tells you that you are at risk for this type of infection. Your sexual activity has changed since you were last screened, and you are at increased risk for chlamydia or gonorrhea. Ask your health care provider if you are at risk. Ask your health care provider about whether you are at high risk for HIV. Your health care provider may  recommend a prescription medicine to help prevent HIV infection. If you choose to take medicine to prevent HIV, you should first get tested for HIV. You should then be tested every 3 months for as long as you are taking the medicine. Pregnancy If you are about to stop having your period (premenopausal) and you may become pregnant, seek counseling before you get pregnant. Take 400 to 800 micrograms (mcg) of folic acid every day if you become pregnant. Ask for birth control (contraception) if you want to prevent pregnancy. Osteoporosis and menopause Osteoporosis is a disease in which the bones lose minerals and strength with aging.  This can result in bone fractures. If you are 84 years old or older, or if you are at risk for osteoporosis and fractures, ask your health care provider if you should: Be screened for bone loss. Take a calcium or vitamin D supplement to lower your risk of fractures. Be given hormone replacement therapy (HRT) to treat symptoms of menopause. Follow these instructions at home: Alcohol use Do not drink alcohol if: Your health care provider tells you not to drink. You are pregnant, may be pregnant, or are planning to become pregnant. If you drink alcohol: Limit how much you have to: 0-1 drink a day. Know how much alcohol is in your drink. In the [place] one drink equals one 12 oz bottle of beer (355 mL), one 5 oz glass of wine (148 mL), or one 1 oz glass of hard liquor (44 mL). Lifestyle Do not use any products that contain nicotine or tobacco. These products include cigarettes, chewing tobacco, and vaping devices, such as e-cigarettes. If you need help quitting, ask your health care provider. Do not use street drugs. Do not share needles. Ask your health care provider for help if you need support or information about quitting drugs. General instructions Schedule regular health, dental, and eye exams. Stay current with your vaccines. Tell your health care provider if: You often feel depressed. You have ever been abused or do not feel safe at home. Summary Adopting a healthy lifestyle and getting preventive care are important in promoting health and wellness. Follow your health care provider's instructions about healthy diet, exercising, and getting tested or screened for diseases. Follow your health care provider's instructions on monitoring your cholesterol and blood pressure. This information is not intended to replace advice given to you by your health care provider. Make sure you discuss any questions you have with your health care provider. Document Revised: 06/24/2020 Document Reviewed:  06/24/2020 Elsevier Patient Education  [place].

## 2021-03-12 NOTE — Progress Notes (Signed)
MEDICARE ANNUAL WELLNESS VISIT  03/12/2021  Telephone Visit Disclaimer This Medicare AWV was conducted by telephone due to national recommendations for restrictions regarding the COVID-19 Pandemic (e.g. social distancing).  I verified, using two identifiers, that I am speaking with Sharon Reilly or their authorized healthcare agent. I discussed the limitations, risks, security, and privacy concerns of performing an evaluation and management service by telephone and the potential availability of an in-person appointment in the future. The patient expressed understanding and agreed to proceed.  Location of Patient: Home Location of Provider (nurse):  In the office.  Subjective:    Sharon Reilly is a 66 y.o. female patient of Metheney, Rene Kocher, MD who had a Medicare Annual Wellness Visit today via telephone. Sharon Reilly is Disabled and lives alone. she has 1 child. she reports that she is socially active and does interact with friends/family regularly. she is moderately physically active and enjoys watching movies and reading..  Patient Care Team: Hali Marry, MD as PCP - General (Family Medicine) Darius Bump, [facility] as Pharmacist (Pharmacist)  Advanced Directives 03/12/2021 11/23/2019 11/21/2018 11/16/2017 02/17/2017 03/23/2016 09/27/2013  Does Patient Have a Medical Advance Directive? No No No No No (No Data) No  Would patient like information on creating a medical advance directive? No - Patient declined - No - Patient declined No - Patient declined No - Patient declined - No - patient declined information    Hospital Utilization Over the Past 12 Months: # of hospitalizations or ER visits: 0 # of surgeries: 0  Review of Systems    Patient reports that her overall health is better compared to last year.  History obtained from chart review and the patient  Patient Reported Readings (BP, Pulse, CBG, Weight, etc) none  Pain Assessment Pain : 0-10 Pain Score: 3  Pain Type:  Chronic pain Pain Location: Generalized Pain Descriptors / Indicators: Constant Pain Onset: More than a month ago Pain Frequency: Constant Pain Relieving Factors: none  Pain Relieving Factors: none  Current Medications & Allergies (verified) Allergies as of 03/12/2021       Reactions   Amoxicillin Hives, Itching   No rash.    Erythromycin Hives   Nsaids Other (See Comments)   CKD 3    Sulfonamide Derivatives    Doxycycline Hives   Sulfa Antibiotics Hives, Rash        Medication List        Accurate as of March 12, 2021  9:20 AM. If you have any questions, ask your nurse or doctor.          acetaminophen 650 MG CR tablet Commonly known as: TYLENOL Take 1 tablet (650 mg total) by mouth every 8 (eight) hours as needed for pain. What changed:  how much to take when to take this   albuterol 108 (90 Base) MCG/ACT inhaler Commonly known as: VENTOLIN HFA Inhale 2 puffs into the lungs every 6 (six) hours as needed for wheezing or shortness of breath.   AMBULATORY NON FORMULARY MEDICATION Medication Name: CPAP with mask and supplies and humidifier.CPAP set to 10 cm water pressure. Patient is currently on autopap. Please fax download after 1 week to or office [phone]   aspirin 81 MG tablet Take 81 mg by mouth daily.   Centrum Silver 50+Women Tabs Take 1 tablet by mouth daily.   fexofenadine 180 MG tablet Commonly known as: ALLEGRA Take 1 tablet (180 mg total) by mouth daily. What changed:  when to take this  reasons to take this   fluticasone 110 MCG/ACT inhaler Commonly known as: FLOVENT HFA Inhale 1 puff into the lungs daily as needed.   losartan-hydrochlorothiazide 100-25 MG tablet Commonly known as: HYZAAR Take 1 tablet by mouth daily.   methimazole 5 MG tablet Commonly known as: TAPAZOLE Take 10 mg by mouth daily.   metoprolol tartrate 100 MG tablet Commonly known as: LOPRESSOR Take 1 tablet (100 mg total) by mouth 2 (two) times daily.    omeprazole 40 MG capsule Commonly known as: PRILOSEC Take 1 capsule (40 mg total) by mouth 2 (two) times daily. What changed: when to take this   predniSONE 20 MG tablet Commonly known as: DELTASONE Take 2 tablets (40 mg total) by mouth daily with breakfast.   rosuvastatin 20 MG tablet Commonly known as: CRESTOR Take 1 tablet (20 mg total) by mouth daily.   triamcinolone cream 0.5 % Commonly known as: KENALOG Apply 1 application topically 3 (three) times daily.        History (reviewed): Past Medical History:  Diagnosis Date   Fibromyalgia    Hyperlipidemia    Hypertension    Lupus ([place])    Lupus (systemic lupus erythematosus) ([place])    NSTEMI (non-ST elevated myocardial infarction) ([place]) 03/08/2016   [facility]   Obesity    Osteoarthritis    Osteoporosis    Past Surgical History:  Procedure Laterality Date   ABDOMINAL HYSTERECTOMY     partial   CARPAL TUNNEL RELEASE     left   CHOLECYSTECTOMY Bilateral 05/14/2016   epicondylitis     left   fistulotomy     great toe fusion     left   MRI syr ortho spec     Dr. Christen Butter   REDUCTION MAMMAPLASTY Bilateral    right elbow     surgisis biodesign fistula plug     talonavicular joint fusion     LT foot   Family History  Problem Relation Age of Onset   Breast cancer Mother        Diagnosed in her 66's.   Hyperlipidemia Mother    Hypertension Mother    Hyperlipidemia Father    Diabetes Brother    Diabetes Other        aunt   Kidney failure Brother    Thyroid disease Sister    Social History   Socioeconomic History   Marital status: Single    Spouse name: Not on file   Number of children: 1   Years of education: 2 years college   Highest education level: 12th grade  Occupational History   Occupation: disabled    Comment: insurance  Tobacco Use   Smoking status: Never   Smokeless tobacco: Never  Vaping Use   Vaping Use: Never used  Substance and Sexual Activity   Alcohol use: No   Drug use: No    Sexual activity: Never  Other Topics Concern   Not on file  Social History Narrative   Lives in apartment at a senior living area. She has one son, he lives in [place]. Her sister lives close by incase she needs anything. She enjoys watching movies and reading.   Social Determinants of Health   Financial Resource Strain: Low Risk    Difficulty of Paying Living Expenses: Not hard at all  Food Insecurity: No Food Insecurity   Worried About Charity fundraiser in the Last Year: Never true   Ran Out of Food in the Last Year: Never true  Transportation Needs: No Data processing manager (Medical): No   Lack of Transportation (Non-Medical): No  Physical Activity: Sufficiently Active   Days of Exercise per Week: 7 days   Minutes of Exercise per Session: 30 min  Stress: No Stress Concern Present   Feeling of Stress : Not at all  Social Connections: Socially Isolated   Frequency of Communication with Friends and Family: More than three times a week   Frequency of Social Gatherings with Friends and Family: More than three times a week   Attends Religious Services: Never   Marine scientist or Organizations: No   Attends Archivist Meetings: Never   Marital Status: Never married    Activities of Daily Living In your present state of health, do you have any difficulty performing the following activities: 03/12/2021 03/08/2021  Hearing? N N  Vision? N N  Difficulty concentrating or making decisions? N N  Walking or climbing stairs? Y Y  Comment due to arthritis in her joints. -  Dressing or bathing? N N  Doing errands, shopping? N N  Preparing Food and eating ? N N  Using the Toilet? N N  In the past six months, have you accidently leaked urine? N Y  Do you have problems with loss of bowel control? N N  Managing your Medications? N N  Managing your Finances? N N  Housekeeping or managing your Housekeeping? N N  Some recent data might be hidden     Patient Education/ Literacy How often do you need to have someone help you when you read instructions, pamphlets, or other written materials from your doctor or pharmacy?: 1 - Never What is the last grade level you completed in school?: associates degree  Exercise Current Exercise Habits: Home exercise routine, Type of exercise: strength training/weights (chair exercises), Time (Minutes): 30, Frequency (Times/Week): 7, Weekly Exercise (Minutes/Week): 210, Intensity: Moderate, Exercise limited by: orthopedic condition(s) (athritis.)  Diet Patient reports consuming  1-3  meals a day and 1-3 snack(s) a day Patient reports that her primary diet is: Regular Patient reports that she does have regular access to food.   Depression Screen PHQ 2/9 Scores 03/12/2021 02/21/2021 12/18/2020 11/23/2019 06/14/2019 11/21/2018 06/14/2018  PHQ - 2 Score 0 0 0 0 1 0 0  PHQ- 9 Score - - - - - - -     Fall Risk Fall Risk  03/12/2021 02/21/2021 12/18/2020 06/13/2020 11/23/2019  Falls in the past year? 0 0 0 1 0  Number falls in past yr: 0 0 0 0 0  Injury with Fall? 0 0 0 0 0  Risk for fall due to : No Fall Risks No Fall Risks No Fall Risks Impaired balance/gait Impaired balance/gait  Follow up Falls evaluation completed Falls prevention discussed;Falls evaluation completed Falls prevention discussed;Falls evaluation completed Falls prevention discussed;Falls evaluation completed Falls prevention discussed;Education provided     Objective:  PRISTINE GLADHILL seemed alert and oriented and she participated appropriately during our telephone visit.  Blood Pressure Weight BMI  BP Readings from Last 3 Encounters:  01/15/21 (!) 120/41  12/18/20 (!) 118/45  06/13/20 126/78   Wt Readings from Last 3 Encounters:  02/21/21 238 lb (108 kg)  01/15/21 238 lb (108 kg)  12/18/20 239 lb (108.4 kg)   BMI Readings from Last 1 Encounters:  02/21/21 44.97 kg/m    *Unable to obtain current vital signs, weight, and BMI due to  telephone visit type  Hearing/Vision  Sharon Reilly  did not seem to have difficulty with hearing/understanding during the telephone conversation Reports that she has had a formal eye exam by an eye care professional within the past year Reports that she has not had a formal hearing evaluation within the past year *Unable to fully assess hearing and vision during telephone visit type  Cognitive Function: 6CIT Screen 03/12/2021 11/23/2019 11/21/2018 11/16/2017 03/23/2016  What Year? 0 points 0 points 0 points 0 points 0 points  What month? 0 points 0 points 0 points 0 points 0 points  What time? 0 points 0 points 0 points 0 points 0 points  Count back from 20 0 points 0 points 0 points 0 points -  Months in reverse 0 points 0 points 0 points 0 points 0 points  Repeat phrase 0 points 6 points 0 points 0 points 0 points  Total Score 0 6 0 0 -   (Normal:0-7, Significant for Dysfunction: >8)  Normal Cognitive Function Screening: Yes   Immunization & Health Maintenance Record Immunization History  Administered Date(s) Administered   Fluad Quad(high Dose 65+) 11/21/2018, 12/18/2020   Influenza Split 12/24/2010   Influenza, High Dose Seasonal PF 11/16/2017   Influenza,inj,Quad PF,6+ Mos 11/29/2012, 10/04/2013, 11/16/2014, 11/21/2015, 10/13/2016, 11/23/2019   Moderna Covid-19 Vaccine Bivalent Booster 31yrs & up 01/30/2020   Moderna Sars-Covid-2 Vaccination 06/01/2019, 07/03/2019   Td 07/04/2010    Health Maintenance  Topic Date Due   Pneumonia Vaccine 56+ Years old (1 - PCV) 04/16/2021 (Originally 07/05/1961)   TETANUS/TDAP  04/16/2021 (Originally 07/03/2020)   Zoster Vaccines- Shingrix (1 of 2) 04/16/2021 (Originally 07/05/2005)   MAMMOGRAM  12/19/2022   COLONOSCOPY (Pts 45-47yrs Insurance coverage will need to be confirmed)  11/01/2025   INFLUENZA VACCINE  Completed   DEXA SCAN  Completed   COVID-19 Vaccine  Completed   Hepatitis C Screening  Completed   HPV VACCINES  Aged Out   HIV Screening   Discontinued       Assessment  This is a routine wellness examination for Sharon Reilly.  Health Maintenance: Due or Overdue There are no preventive care reminders to display for this patient.  Sharon Reilly does not need a referral for Community Assistance: Care Management:   no Social Work:    no Prescription Assistance:  no Nutrition/Diabetes Education:  no   Plan:  Personalized Goals  Goals Addressed               This Visit's Progress     Patient Stated (pt-stated)        03/12/2021 AWV Goal: Exercise for General Health  Patient will verbalize understanding of the benefits of increased physical activity: Exercising regularly is important. It will improve your overall fitness, flexibility, and endurance. Regular exercise also will improve your overall health. It can help you control your weight, reduce stress, and improve your bone density. Over the next year, patient will increase physical activity as tolerated with a goal of at least 150 minutes of moderate physical activity per week.  You can tell that you are exercising at a moderate intensity if your heart starts beating faster and you start breathing faster but can still hold a conversation. Moderate-intensity exercise ideas include: Walking 1 mile (1.6 km) in about 15 minutes Biking Hiking Golfing Dancing Water aerobics Patient will verbalize understanding of everyday activities that increase physical activity by providing examples like the following: Yard work, such as: Buyer, retail  snow Gardening Washing windows or floors Patient will be able to explain general safety guidelines for exercising:  Before you start a new exercise program, talk with your health care provider. Do not exercise so much that you hurt yourself, feel dizzy, or get very short of breath. Wear comfortable clothes and wear shoes with good  support. Drink plenty of water while you exercise to prevent dehydration or heat stroke. Work out until your breathing and your heartbeat get faster.        Personalized Health Maintenance & Screening Recommendations  Pneumococcal vaccine  Td vaccine Shingrix vaccine  Lung Cancer Screening Recommended: no (Low Dose CT Chest recommended if Age 71-80 years, 30 pack-year currently smoking OR have quit w/in past 15 years) Hepatitis C Screening recommended: no HIV Screening recommended: no  Advanced Directives: Written information was not prepared per patient's request.  Referrals & Orders No orders of the defined types were placed in this encounter.   Follow-up Plan Follow-up with Hali Marry, MD as planned Schedule your tetanus and Shingrix vaccine at your pharmacy.  Pneumonia vaccine can be done in the office or at your pharmacy. Medicare wellness in one year. AVS printed and mailed to the patient.   I have personally reviewed and noted the following in the patients chart:   Medical and social history Use of alcohol, tobacco or illicit drugs  Current medications and supplements Functional ability and status Nutritional status Physical activity Advanced directives List of other physicians Hospitalizations, surgeries, and ER visits in previous 12 months Vitals Screenings to include cognitive, depression, and falls Referrals and appointments  In addition, I have reviewed and discussed with Sharon Reilly certain preventive protocols, quality metrics, and best practice recommendations. A written personalized care plan for preventive services as well as general preventive health recommendations is available and can be mailed to the patient at her request.      Tinnie Gens, RN  03/12/2021

## 2021-03-13 ENCOUNTER — Ambulatory Visit: Payer: Medicare HMO | Attending: Family Medicine | Admitting: Physical Therapy

## 2021-03-13 ENCOUNTER — Other Ambulatory Visit: Payer: Self-pay

## 2021-03-13 DIAGNOSIS — R42 Dizziness and giddiness: Secondary | ICD-10-CM | POA: Diagnosis not present

## 2021-03-13 DIAGNOSIS — H938X3 Other specified disorders of ear, bilateral: Secondary | ICD-10-CM | POA: Insufficient documentation

## 2021-03-13 NOTE — Therapy (Addendum)
[facility] [place], [place], [address] Phone: [phone]   Fax:  [phone]  Physical Therapy Evaluation - One Visit Consult/Education  Patient Details  Name: Sharon Reilly MRN: [id number] Date of Birth: 05/12/1955 Referring Provider (PT): Hali Marry, MD   Encounter Date: 03/13/2021   PT End of Session - 03/13/21 1103     Visit Number 1    Number of Visits 1    Authorization Type [employer] Medicare    PT Start Time 1100    PT Stop Time 1140    PT Time Calculation (min) 40 min    Activity Tolerance Patient tolerated treatment well    Behavior During Therapy [facility] for tasks assessed/performed             Past Medical History:  Diagnosis Date   Fibromyalgia    Hyperlipidemia    Hypertension    Lupus ([place])    Lupus (systemic lupus erythematosus) ([place])    NSTEMI (non-ST elevated myocardial infarction) ([place]) 03/08/2016   [facility]   Obesity    Osteoarthritis    Osteoporosis     Past Surgical History:  Procedure Laterality Date   ABDOMINAL HYSTERECTOMY     partial   CARPAL TUNNEL RELEASE     left   CHOLECYSTECTOMY Bilateral 05/14/2016   epicondylitis     left   fistulotomy     great toe fusion     left   MRI syr ortho spec     Dr. Christen Butter   REDUCTION MAMMAPLASTY Bilateral    right elbow     surgisis biodesign fistula plug     talonavicular joint fusion     LT foot    There were no vitals filed for this visit.    Subjective Assessment - 03/13/21 1107     Subjective Pt reports waking up with a bad case of vertigo. It has happened before but it lasted a day. This time it lasted a week. Pt does report it has improved. Pt comes into PT to seek further education on how to check and fix it in the future.    Patient Stated Goals Decrease incidence of future incidences of vertigo    Currently in Pain? No/denies                [facility] PT Assessment - 03/13/21 0001        Assessment   Medical Diagnosis R42 (ICD-10-CM) - Dizziness    Referring Provider (PT) Hali Marry, MD    Onset Date/Surgical Date --   Nov 2022   Prior Therapy None      Balance Screen   Has the patient fallen in the past 6 months No                    Vestibular Assessment - 03/13/21 0001       Symptom Behavior   Subjective history of current problem Symptoms noted below was when she had her episode of dizziness -- currently has no symptoms.    Type of Dizziness  Spinning;"World moves";Vertigo    Frequency of Dizziness With movement    Duration of Dizziness A few seconds    Symptom Nature Motion provoked;Positional    Aggravating Factors Forward bending;Lying supine   Laying on her side in bed   Relieving Factors Rest;Slow movements    Progression of Symptoms Better      Positional Testing   Dix-Hallpike Dix-Hallpike Right;Dix-Hallpike Left  Horizontal Canal Testing Horizontal Canal Right;Horizontal Canal Left      Dix-Hallpike Right   Dix-Hallpike Right Duration 0      Dix-Hallpike Left   Dix-Hallpike Left Duration 0      Horizontal Canal Right   Horizontal Canal Right Duration 0      Horizontal Canal Left   Horizontal Canal Left Duration 0                Objective measurements completed on examination: See above findings.       [facility] Adult PT Treatment/Exercise - 03/13/21 0001       Self-Care   Self-Care Other Self-Care Comments    Other Self-Care Comments  Increased time spent discussing BPPV, how to check for it at home and how to treat it at home for any future recurrence.           Performed Self Epley Maneuver R & L x1 each for demonstration          PT Education - 03/13/21 1139     Education Details BPPV anatomy/pathophysiology. Discuss home epley maneuvers and Dix-Hallpike    Person(s) Educated Patient    Methods Explanation;Demonstration;Tactile cues;Verbal cues;Handout    Comprehension Verbalized  understanding;Returned demonstration;Verbal cues required;Tactile cues required                 PT Long Term Goals - 03/13/21 1315       PT LONG TERM GOAL #1   Title Pt will be able to verbalize and demonstrate self Epley maneuver for home    Time 1    Period Days    Status Achieved    Target Date 03/13/21      PT LONG TERM GOAL #2   Title Pt will be able to verbalize how to check herself for BPPV with modified Dix-Hallpike at home    Time 1    Period Days    Status Achieved    Target Date 03/13/21                    Plan - 03/13/21 1140     Clinical Impression Statement [name] is a 66 y/o F presenting to OPPT to seek further information on how to manage BPPV and dizziness. Pt had a bout of dizziness and vertigo in late 2022 for about a week. Has had a prior history of this vertigo a year before that. Dizziness has since resolved. Dix-Hallpike testing was (-) as was sidelying test. Reviewed how to test herself at home and how to perform Epley maneuver safely. Answered pt's questions as able. Pt has no further PT needs at this time. Discussed with pt to call if any issues, questions, or recurrence.    Personal Factors and Comorbidities Age;Time since onset of injury/illness/exacerbation;Past/Current Experience    Stability/Clinical Decision Making Stable/Uncomplicated    Clinical Decision Making Low    Rehab Potential Good    PT Frequency One time visit    PT Treatment/Interventions ADLs/Self Care Home Management    PT Next Visit Plan Consult visit only    PT Home Exercise Plan Provided home epley and how to perform dix-hallpike    Consulted and Agree with Plan of Care Patient             Patient will benefit from skilled therapeutic intervention in order to improve the following deficits and impairments:  Dizziness  Visit Diagnosis: Dizziness and giddiness     Problem List Patient Active Problem  List   Diagnosis Date Noted   OAB  (overactive bladder) 12/18/2020   Chronic pain of right thumb 59/16/3846   Umbilical hernia without obstruction and without gangrene 06/26/2019   Hallux limitus of right foot 03/02/2019   Onychomycosis of toenail 12/14/2018   Eczema 06/14/2018   CKD (chronic kidney disease) stage 3, GFR 30-59 ml/min (HCC) 04/08/2017   Secondary hyperparathyroidism of renal origin ([place]) 01/11/2017   Anemia in stage 3 chronic kidney disease ([place]) 11/18/2016   Hallux rigidus of right foot 09/29/2016   Plantar fasciitis of right foot 09/29/2016   Primary osteoarthritis of both knees 09/29/2016   Gallstones 05/06/2016   Pure hypercholesterolemia 03/31/2016   CAD (coronary artery disease) 03/09/2016   NSTEMI (non-ST elevated myocardial infarction) ([place]) 03/08/2016   Internal hemorrhoids 10/14/2015   Gastroesophageal reflux disease with esophagitis 08/14/2015   Lymphadenopathy of right cervical region 08/05/2015   Lumbago with sciatica 11/07/2014   Left thyroid nodule 02/01/2014   Nonpalpable mass of neck 02/01/2014   OSA (obstructive sleep apnea) 10/04/2013   Lactose intolerance 05/31/2013   IFG (impaired fasting glucose) 09/01/2012   Fibromyalgia 07/10/2010   Tubular adenoma of colon 07/10/2010   Anal fistula 07/10/2010   Essential hypertension, benign 06/26/2010   Morbid obesity ([place]) 06/26/2010   HYPERLIPIDEMIA 05/20/2010   OSTEOPOROSIS 05/20/2010    Devani Odonnel April Gordy Levan, PT, DPT 03/13/2021, 1:24 PM  [facility] [address] [place], [place], [address] Phone: [phone]   Fax:  [phone]  Name: ARTI TRANG MRN: [id number] Date of Birth: Nov 07, 1955

## 2021-03-13 NOTE — Addendum Note (Signed)
Addended by: Colbert Ewing MARIE L on: 03/13/2021 01:27 PM   Modules accepted: Orders

## 2021-03-17 ENCOUNTER — Other Ambulatory Visit: Payer: Self-pay | Admitting: Family Medicine

## 2021-04-17 DIAGNOSIS — R6889 Other general symptoms and signs: Secondary | ICD-10-CM | POA: Diagnosis not present

## 2021-04-17 DIAGNOSIS — H903 Sensorineural hearing loss, bilateral: Secondary | ICD-10-CM | POA: Diagnosis not present

## 2021-04-17 DIAGNOSIS — H9190 Unspecified hearing loss, unspecified ear: Secondary | ICD-10-CM | POA: Diagnosis not present

## 2021-04-17 DIAGNOSIS — J310 Chronic rhinitis: Secondary | ICD-10-CM | POA: Diagnosis not present

## 2021-04-17 DIAGNOSIS — H8110 Benign paroxysmal vertigo, unspecified ear: Secondary | ICD-10-CM | POA: Diagnosis not present

## 2021-04-28 DIAGNOSIS — E0590 Thyrotoxicosis, unspecified without thyrotoxic crisis or storm: Secondary | ICD-10-CM | POA: Diagnosis not present

## 2021-04-28 DIAGNOSIS — M069 Rheumatoid arthritis, unspecified: Secondary | ICD-10-CM | POA: Diagnosis not present

## 2021-04-28 DIAGNOSIS — M329 Systemic lupus erythematosus, unspecified: Secondary | ICD-10-CM | POA: Diagnosis not present

## 2021-04-28 DIAGNOSIS — R6889 Other general symptoms and signs: Secondary | ICD-10-CM | POA: Diagnosis not present

## 2021-04-28 DIAGNOSIS — Z6841 Body Mass Index (BMI) 40.0 and over, adult: Secondary | ICD-10-CM | POA: Diagnosis not present

## 2021-06-29 ENCOUNTER — Other Ambulatory Visit: Payer: Self-pay | Admitting: Family Medicine

## 2021-08-21 ENCOUNTER — Other Ambulatory Visit: Payer: Self-pay | Admitting: Family Medicine

## 2021-08-28 DIAGNOSIS — R6889 Other general symptoms and signs: Secondary | ICD-10-CM | POA: Diagnosis not present

## 2021-09-04 ENCOUNTER — Other Ambulatory Visit: Payer: Self-pay | Admitting: Family Medicine

## 2021-09-04 DIAGNOSIS — R6889 Other general symptoms and signs: Secondary | ICD-10-CM | POA: Diagnosis not present

## 2021-09-15 DIAGNOSIS — H5213 Myopia, bilateral: Secondary | ICD-10-CM | POA: Diagnosis not present

## 2021-09-15 DIAGNOSIS — H524 Presbyopia: Secondary | ICD-10-CM | POA: Diagnosis not present

## 2021-09-15 DIAGNOSIS — H25813 Combined forms of age-related cataract, bilateral: Secondary | ICD-10-CM | POA: Diagnosis not present

## 2021-09-23 ENCOUNTER — Ambulatory Visit (INDEPENDENT_AMBULATORY_CARE_PROVIDER_SITE_OTHER): Payer: Medicare HMO | Admitting: Pharmacist

## 2021-09-23 DIAGNOSIS — R7301 Impaired fasting glucose: Secondary | ICD-10-CM

## 2021-09-23 DIAGNOSIS — E7800 Pure hypercholesterolemia, unspecified: Secondary | ICD-10-CM

## 2021-09-23 DIAGNOSIS — I10 Essential (primary) hypertension: Secondary | ICD-10-CM

## 2021-09-23 MED ORDER — TRIAMCINOLONE ACETONIDE 0.5 % EX CREA: 1.0000 | TOPICAL_CREAM | Freq: Two times a day (BID) | CUTANEOUS | 1 refills | Status: DC | PRN

## 2021-09-23 NOTE — Patient Instructions (Signed)
Visit Information  Thank you for taking time to visit with me today. Please don't hesitate to contact me if I can be of assistance to you before our next scheduled telephone appointment.  Following are the goals we discussed today:    Patient Goals/Self-Care Activities Over the next 365 days, patient will:  take medications as prescribed  Follow Up Plan: Telephone follow up appointment with care management team member scheduled for:  1 yr  Please call the care guide team at [phone] if you need to cancel or reschedule your appointment.    Patient verbalizes understanding of instructions and care plan provided today and agrees to view in [place]. Active MyChart status and patient understanding of how to access instructions and care plan via MyChart confirmed with patient.     Sharon Reilly

## 2021-09-23 NOTE — Progress Notes (Addendum)
Chronic Care Management Pharmacy Note  09/23/2021 Name:  Sharon Reilly MRN:  [id number] DOB:  04-09-1955  Summary: addressed HTN, HLD, pre-DM. Patient is doing well, reviewed and updated medication list.  Patient requests more triamcinolone cream for eczema.   Recommendations/Changes made from today's visit:  - No changes at this time, except patient requests more triamcinolone cream for eczema, sent at PCP discretion.  Plan: f/u with pharmacist in 12 months   Subjective: Sharon Reilly is an 66 y.o. year old female who is a primary patient of Metheney, Rene Kocher, MD.  The CCM team was consulted for assistance with disease management and care coordination needs.    Engaged with patient by telephone for follow up visit in response to provider referral for pharmacy case management and/or care coordination services.   Consent to Services:  The patient was given information about Chronic Care Management services, agreed to services, and gave verbal consent prior to initiation of services.  Please see initial visit note for detailed documentation.   Patient Care Team: Hali Marry, MD as PCP - General (Family Medicine) Darius Bump, [facility] as Pharmacist (Pharmacist)  Recent office visits: 06/13/20 - Dr. Madilyn Fireman (PCP): HTN f/u  Recent consult visits: [facility] visits: None in previous 6 months  Objective:  Lab Results  Component Value Date   CREATININE 1.03 12/18/2020   CREATININE 1.19 (H) 06/13/2020   CREATININE 1.27 (H) 12/14/2019    Lab Results  Component Value Date   HGBA1C 5.9 (A) 12/18/2020   Last diabetic Eye exam: No results found for: "HMDIABEYEEXA"  Last diabetic Foot exam: No results found for: "HMDIABFOOTEX"      Component Value Date/Time   CHOL 133 12/18/2020 0000   TRIG 113 12/18/2020 0000   HDL 39 (L) 12/18/2020 0000   CHOLHDL 3.4 12/18/2020 0000   VLDL 15 11/21/2014 0909   LDLCALC 74 12/18/2020 0000       Latest Ref Rng & Units  12/18/2020   12:00 AM 12/14/2019   11:01 AM 08/01/2018    1:39 PM  Hepatic Function  Total Protein 6.1 - 8.1 g/dL 6.4  6.9  6.7   AST 10 - 35 U/L '17  20  18   '$ ALT 6 - 29 U/L '14  12  22   '$ Total Bilirubin 0.2 - 1.2 mg/dL 0.3  0.4  0.4     Lab Results  Component Value Date/Time   TSH 0.03 (L) 06/13/2020 12:00 AM   TSH 3.01 05/22/2015 10:27 AM   FREET4 2.2 (H) 12/18/2020 12:00 AM   FREET4 1.6 06/13/2020 12:00 AM       Latest Ref Rng & Units 07/16/2018   12:00 AM 01/14/2017   12:00 AM 08/13/2015    2:37 PM  CBC  WBC  4.2     4.4     6.5   Hemoglobin 12.0 - 16.0 11.4     10.2     11.6   Hematocrit 36 - 46 34     30     34.9   Platelets 150 - 399  280     287      This result is from an external source.    Lab Results  Component Value Date/Time   VD25OH 20 (L) 05/22/2015 10:27 AM   VD25OH 41 09/24/2010 02:10 PM    Clinical ASCVD: Yes  The ASCVD Risk score (Arnett DK, et al., 2019) failed to calculate for the following reasons:   The  patient has a prior MI or stroke diagnosis    Other: (CHADS2VASc if Afib, PHQ9 if depression, MMRC or CAT for COPD, ACT, DEXA)  Social History   Tobacco Use  Smoking Status Never  Smokeless Tobacco Never   BP Readings from Last 3 Encounters:  01/15/21 (!) 120/41  12/18/20 (!) 118/45  06/13/20 126/78   Pulse Readings from Last 3 Encounters:  01/15/21 67  12/18/20 60  06/13/20 (!) 58   Wt Readings from Last 3 Encounters:  02/21/21 238 lb (108 kg)  01/15/21 238 lb (108 kg)  12/18/20 239 lb (108.4 kg)    Assessment: Review of patient past medical history, allergies, medications, health status, including review of consultants reports, laboratory and other test data, was performed as part of comprehensive evaluation and provision of chronic care management services.   SDOH:  (Social Determinants of Health) assessments and interventions performed:    CCM Care Plan  Allergies  Allergen Reactions   Amoxicillin Hives and Itching     No rash.    Erythromycin Hives   Nsaids Other (See Comments)    CKD 3    Sulfonamide Derivatives    Doxycycline Hives   Sulfa Antibiotics Hives and Rash    Medications Reviewed Today     Reviewed by Darius Bump, [facility] (Pharmacist) on 09/23/21 at 1016  Med List Status: <None>   Medication Order Taking? Sig Documenting Provider Last Dose Status Informant  acetaminophen (TYLENOL) 650 MG CR tablet [id number] Yes Take 1 tablet (650 mg total) by mouth every 8 (eight) hours as needed for pain.  Patient taking differently: Take 1,300 mg by mouth 2 (two) times daily as needed for pain.   Silverio Decamp, MD Taking Active   albuterol (VENTOLIN HFA) 108 (90 Base) MCG/ACT inhaler [id number] Yes Inhale 2 puffs into the lungs every 6 (six) hours as needed for wheezing or shortness of breath. Hali Marry, MD Taking Active   Camillo Flaming MEDICATION [id number] Yes Medication Name: CPAP with mask and supplies and humidifier.CPAP set to 10 cm water pressure. Patient is currently on autopap. Please fax download after 1 week to or office [phone] Hali Marry, MD Taking Active   aspirin 81 MG tablet [id number] Yes Take 81 mg by mouth daily. [provider] Taking Active   fexofenadine (ALLEGRA) 180 MG tablet [id number] Yes Take 1 tablet (180 mg total) by mouth daily.  Patient taking differently: Take 180 mg by mouth as needed.   Bowen, Collene Leyden, DO Taking Active   fluticasone (FLOVENT HFA) 110 MCG/ACT inhaler [id number] No Inhale 1 puff into the lungs daily as needed.  Patient not taking: Reported on 09/23/2021   [provider] Not Taking Active   losartan-hydrochlorothiazide (HYZAAR) 100-25 MG tablet [id number] Yes Take 1 tablet by mouth daily. Hali Marry, MD Taking Active   methimazole (TAPAZOLE) 5 MG tablet [id number] Yes Take 5 mg by mouth daily. [provider] Taking Active   metoprolol tartrate (LOPRESSOR) 100 MG tablet [id number] Yes  Take 1 tablet (100 mg total) by mouth 2 (two) times daily. NEEDS APPT/LABS Hali Marry, MD Taking Active   Multiple Vitamins-Minerals (CENTRUM SILVER 50+WOMEN) TABS [id number] Yes Take 1 tablet by mouth daily. [provider] Taking Active Self  omeprazole (PRILOSEC) 40 MG capsule [id number] Yes Take 1 capsule (40 mg total) by mouth 2 (two) times daily.  Patient taking differently: Take 40 mg by mouth daily.   Hali Marry, MD Taking Active  Med Note Tonny Bollman Jul 18, 2020  9:17 AM) Taking 1 tablet daily  rosuvastatin (CRESTOR) 20 MG tablet [id number] Yes Take 1 tablet (20 mg total) by mouth daily. Hali Marry, MD Taking Active   triamcinolone cream (KENALOG) 0.5 % [id number] Yes Apply 1 application topically 3 (three) times daily. [provider] Taking Active             Patient Active Problem List   Diagnosis Date Noted   OAB (overactive bladder) 12/18/2020   Chronic pain of right thumb 08/16/1599   Umbilical hernia without obstruction and without gangrene 06/26/2019   Hallux limitus of right foot 03/02/2019   Onychomycosis of toenail 12/14/2018   Eczema 06/14/2018   CKD (chronic kidney disease) stage 3, GFR 30-59 ml/min (HCC) 04/08/2017   Secondary hyperparathyroidism of renal origin ([place]) 01/11/2017   Anemia in stage 3 chronic kidney disease ([place]) 11/18/2016   Hallux rigidus of right foot 09/29/2016   Plantar fasciitis of right foot 09/29/2016   Primary osteoarthritis of both knees 09/29/2016   Gallstones 05/06/2016   Pure hypercholesterolemia 03/31/2016   CAD (coronary artery disease) 03/09/2016   NSTEMI (non-ST elevated myocardial infarction) ([place]) 03/08/2016   Internal hemorrhoids 10/14/2015   Gastroesophageal reflux disease with esophagitis 08/14/2015   Lymphadenopathy of right cervical region 08/05/2015   Lumbago with sciatica 11/07/2014   Left thyroid nodule 02/01/2014   Nonpalpable mass of neck  02/01/2014   OSA (obstructive sleep apnea) 10/04/2013   Lactose intolerance 05/31/2013   IFG (impaired fasting glucose) 09/01/2012   Fibromyalgia 07/10/2010   Tubular adenoma of colon 07/10/2010   Anal fistula 07/10/2010   Essential hypertension, benign 06/26/2010   Morbid obesity ([place]) 06/26/2010   HYPERLIPIDEMIA 05/20/2010   OSTEOPOROSIS 05/20/2010    Immunization History  Administered Date(s) Administered   Fluad Quad(high Dose 65+) 11/21/2018, 12/18/2020   Influenza Split 12/24/2010   Influenza, High Dose Seasonal PF 11/16/2017   Influenza,inj,Quad PF,6+ Mos 11/29/2012, 10/04/2013, 11/16/2014, 11/21/2015, 10/13/2016, 11/23/2019   Moderna Covid-19 Vaccine Bivalent Booster 55yr & up 01/30/2020   Moderna Sars-Covid-2 Vaccination 06/01/2019, 07/03/2019   Td 07/04/2010    Conditions to be addressed/monitored: HTN and HLD  Care Plan : Medication Management  Updates made by KDarius Bump RDumontsince 09/23/2021 12:00 AM     Problem: HTN, HLD, PreDM      Long-Range Goal: Disease Progression Prevention   Start Date: 07/18/2020  Recent Progress: On track  Priority: High  Note:   Current Barriers:  none at present  Pharmacist Clinical Goal(s):  Over the next 180 days, patient will achieve control of pre-DM & cholesterol as evidenced by diet/lifestyle modification through collaboration with PharmD and provider.   Interventions: 1:1 collaboration with MHali Marry MD regarding development and update of comprehensive plan of care as evidenced by provider attestation and co-signature Inter-disciplinary care team collaboration (see longitudinal plan of care) Comprehensive medication review performed; medication list updated in electronic medical record  Hypertension:  Controlled; current treatment:losartan-hctz 100- '25mg'$ , metoprolol '100mg'$  BID;   Current home readings: doesn't check frequently, but sister checks occasionally & is well-controlled  Denies  hypotensive/hypertensive symptoms  Recommended continue current regimen    Hyperlipidemia:  Controlled; current treatment: rosuvastatin '20mg'$ , increased from our last discussion and lipid panel improved;  Recommended continue current regimen  PreDiabetes:  Controlled; current treatment: lifestyle modifications;   Meal patterns:  Breakfast: no breakfast, sometimes tKuwaitsausage + wheat toast Lunch: fast food, salad, tKuwaitsandwhich Dinner:  baked chicken, fries, pork chops, ribs, broccoli, beets, zuccini, collard greens Snacks: icecream, chocolate PB wafers Drinks: juicy juice or orange juice, water, unsweet tea  Educated on carbohydrates in moderation, alternative options for fast/convenient meals, aiming for vegetables + lean meats, & minimizing juicy juice   Patient Goals/Self-Care Activities Over the next 365 days, patient will:  take medications as prescribed  Follow Up Plan: Telephone follow up appointment with care management team member scheduled for:  1 yr      Medication Assistance: None required.  Patient affirms current coverage meets needs.  Patient's preferred pharmacy is:  [place], [place] - [address] [place] [address] Phone: [phone] Fax: [phone]  [place], [place] - [address] [place] [address] Phone: [phone] Fax: [phone]  [facility] [place], [place] [address] Phone: [phone] Fax: [phone]   Uses pill box? Yes Pt endorses 100% compliance  Follow Up:  Patient agrees to Care Plan and Follow-up.  Plan: Telephone follow up appointment with care management team member scheduled for:  6 months  Larinda Buttery, PharmD Clinical Pharmacist [facility] [address] [phone]

## 2021-09-23 NOTE — Addendum Note (Signed)
Addended by: Beatrice Lecher D on: 09/23/2021 06:33 PM   Modules accepted: Orders

## 2021-10-16 DIAGNOSIS — E785 Hyperlipidemia, unspecified: Secondary | ICD-10-CM

## 2021-10-16 DIAGNOSIS — I10 Essential (primary) hypertension: Secondary | ICD-10-CM

## 2021-10-20 ENCOUNTER — Other Ambulatory Visit: Payer: Self-pay | Admitting: Family Medicine

## 2021-10-21 DIAGNOSIS — R6889 Other general symptoms and signs: Secondary | ICD-10-CM | POA: Diagnosis not present

## 2021-10-28 DIAGNOSIS — R6889 Other general symptoms and signs: Secondary | ICD-10-CM | POA: Diagnosis not present

## 2021-11-17 ENCOUNTER — Other Ambulatory Visit: Payer: Self-pay | Admitting: Family Medicine

## 2021-11-17 DIAGNOSIS — Z1231 Encounter for screening mammogram for malignant neoplasm of breast: Secondary | ICD-10-CM

## 2021-11-24 ENCOUNTER — Other Ambulatory Visit: Payer: Self-pay | Admitting: Family Medicine

## 2021-12-04 DIAGNOSIS — E0590 Thyrotoxicosis, unspecified without thyrotoxic crisis or storm: Secondary | ICD-10-CM | POA: Diagnosis not present

## 2021-12-04 DIAGNOSIS — R6889 Other general symptoms and signs: Secondary | ICD-10-CM | POA: Diagnosis not present

## 2021-12-09 DIAGNOSIS — R6889 Other general symptoms and signs: Secondary | ICD-10-CM | POA: Diagnosis not present

## 2021-12-17 ENCOUNTER — Other Ambulatory Visit: Payer: Self-pay | Admitting: Family Medicine

## 2021-12-29 ENCOUNTER — Other Ambulatory Visit: Payer: Self-pay | Admitting: Family Medicine

## 2022-01-01 ENCOUNTER — Ambulatory Visit (INDEPENDENT_AMBULATORY_CARE_PROVIDER_SITE_OTHER): Payer: Medicare HMO

## 2022-01-01 ENCOUNTER — Ambulatory Visit (INDEPENDENT_AMBULATORY_CARE_PROVIDER_SITE_OTHER): Payer: Medicare HMO | Admitting: Family Medicine

## 2022-01-01 VITALS — BP 157/64 | HR 68 | Ht 61.0 in | Wt 249.0 lb

## 2022-01-01 DIAGNOSIS — I10 Essential (primary) hypertension: Secondary | ICD-10-CM | POA: Diagnosis not present

## 2022-01-01 DIAGNOSIS — R6889 Other general symptoms and signs: Secondary | ICD-10-CM | POA: Diagnosis not present

## 2022-01-01 DIAGNOSIS — R252 Cramp and spasm: Secondary | ICD-10-CM | POA: Diagnosis not present

## 2022-01-01 DIAGNOSIS — D631 Anemia in chronic kidney disease: Secondary | ICD-10-CM

## 2022-01-01 DIAGNOSIS — Z23 Encounter for immunization: Secondary | ICD-10-CM | POA: Diagnosis not present

## 2022-01-01 DIAGNOSIS — E7800 Pure hypercholesterolemia, unspecified: Secondary | ICD-10-CM | POA: Diagnosis not present

## 2022-01-01 DIAGNOSIS — N1831 Chronic kidney disease, stage 3a: Secondary | ICD-10-CM

## 2022-01-01 DIAGNOSIS — E0590 Thyrotoxicosis, unspecified without thyrotoxic crisis or storm: Secondary | ICD-10-CM | POA: Diagnosis not present

## 2022-01-01 DIAGNOSIS — L603 Nail dystrophy: Secondary | ICD-10-CM | POA: Diagnosis not present

## 2022-01-01 DIAGNOSIS — Z1231 Encounter for screening mammogram for malignant neoplasm of breast: Secondary | ICD-10-CM

## 2022-01-01 DIAGNOSIS — R7301 Impaired fasting glucose: Secondary | ICD-10-CM | POA: Diagnosis not present

## 2022-01-01 DIAGNOSIS — M67432 Ganglion, left wrist: Secondary | ICD-10-CM

## 2022-01-01 MED ORDER — METOPROLOL TARTRATE 100 MG PO TABS: ORAL_TABLET | ORAL | 3 refills | Status: DC

## 2022-01-01 MED ORDER — ROSUVASTATIN CALCIUM 20 MG PO TABS: 20.0000 mg | ORAL_TABLET | Freq: Every day | ORAL | 3 refills | Status: DC

## 2022-01-01 NOTE — Assessment & Plan Note (Addendum)
1C looks great today at 5.9.  Stable.  Continue current regimen.  Great job with daily exercise she is felt better by doing this.  Lab Results  Component Value Date   HGBA1C 5.9 (A) 12/18/2020

## 2022-01-01 NOTE — Assessment & Plan Note (Signed)
Blood pressure is definitely elevated today.  It looked fantastic last time I saw her.  With no significant changes I am just can hold off on making any medication regimen changes.

## 2022-01-01 NOTE — Progress Notes (Addendum)
Established Patient Office Visit  Subjective   Patient ID: Sharon Reilly, female    DOB: 08-04-1955  Age: 66 y.o. MRN: [id number]  Chief Complaint  Patient presents with   Follow-up   Hypertension    HPI Hypertension- Pt denies chest pain, SOB, dizziness, or heart palpitations.  Taking meds as directed w/o problems.  Denies medication side effects.  Been trying to keep up with her chair exercises walking and doing weights daily.  She has been extremely consistent with her exercise for almost 8 months now.  Will need new supplies for her CPAP.  She originally got the machine through [place] and they let her keep the machine but she will need a new order sent to a new company for her CPAP supplies.  Cramping in her upper thighs, lower legs and feet for for 6-8 months.  Right worse than left.  She notices it more in the evening after she has been walking a lot.  It does not happen while walking.  She tries to wear good supportive shoe wear.  Also having some toenail issues.  On her right great toenail it seems to be getting more thick laterally and curving into the skin and causing pain and discomfort.  On her second toenail head has started growing and curving medially.  Knot on her  left wrist. Occ tender.   She would like me to take a look at that today.    ROS    Objective:     BP (!) 157/64   Pulse 68   Ht 5' 1" (1.549 m)   Wt 249 lb (112.9 kg)   SpO2 98%   BMI 47.05 kg/m    Physical Exam Vitals and nursing note reviewed.  Constitutional:      Appearance: She is well-developed.  HENT:     Head: Normocephalic and atraumatic.  Cardiovascular:     Rate and Rhythm: Normal rate and regular rhythm.     Heart sounds: Normal heart sounds.  Pulmonary:     Effort: Pulmonary effort is normal.     Breath sounds: Normal breath sounds.  Skin:    General: Skin is warm and dry.  Neurological:     Mental Status: She is alert and oriented to person, place, and time.   Psychiatric:        Behavior: Behavior normal.      Results for orders placed or performed in visit on 01/01/22  TSH  Result Value Ref Range   TSH 2.46 0.40 - 4.50 mIU/L  Lipid Panel w/reflex Direct LDL  Result Value Ref Range   Cholesterol 158 <200 mg/dL   HDL 46 (L) > OR = 50 mg/dL   Triglycerides 122 <150 mg/dL   LDL Cholesterol (Calc) 90 mg/dL (calc)   Total CHOL/HDL Ratio 3.4 <5.0 (calc)   Non-HDL Cholesterol (Calc) 112 <130 mg/dL (calc)  COMPLETE METABOLIC PANEL WITH GFR  Result Value Ref Range   Glucose, Bld 95 65 - 139 mg/dL   BUN 25 7 - 25 mg/dL   Creat 1.14 (H) 0.50 - 1.05 mg/dL   eGFR 53 (L) > OR = 60 mL/min/1.96m   BUN/Creatinine Ratio 22 6 - 22 (calc)   Sodium 142 135 - 146 mmol/L   Potassium 3.6 3.5 - 5.3 mmol/L   Chloride 106 98 - 110 mmol/L   CO2 30 20 - 32 mmol/L   Calcium 9.4 8.6 - 10.4 mg/dL   Total Protein 6.6 6.1 - 8.1 g/dL  Albumin 3.7 3.6 - 5.1 g/dL   Globulin 2.9 1.9 - 3.7 g/dL (calc)   AG Ratio 1.3 1.0 - 2.5 (calc)   Total Bilirubin 0.3 0.2 - 1.2 mg/dL   Alkaline phosphatase (APISO) 87 37 - 153 U/L   AST 19 10 - 35 U/L   ALT 18 6 - 29 U/L  Hemoglobin A1c  Result Value Ref Range   Hgb A1c MFr Bld 6.1 (H) <5.7 % of total Hgb   Mean Plasma Glucose 128 mg/dL   eAG (mmol/L) 7.1 mmol/L  Magnesium  Result Value Ref Range   Magnesium 1.7 1.5 - 2.5 mg/dL  CK  Result Value Ref Range   Total CK 178 (H) 29 - 143 U/L      The ASCVD Risk score (Arnett DK, et al., 2019) failed to calculate for the following reasons:   The patient has a prior MI or stroke diagnosis    Assessment & Plan:   Problem List Items Addressed This Visit       Cardiovascular and Mediastinum   Essential hypertension, benign    Blood pressure is definitely elevated today.  It looked fantastic last time I saw her.  With no significant changes I am just can hold off on making any medication regimen changes.      Relevant Medications   metoprolol tartrate (LOPRESSOR)  100 MG tablet   rosuvastatin (CRESTOR) 20 MG tablet   Other Relevant Orders   COMPLETE METABOLIC PANEL WITH GFR (Completed)   CK (Creatine Kinase)   Magnesium   Ambulatory referral to Podiatry     Endocrine   IFG (impaired fasting glucose)    1C looks great today at 5.9.  Stable.  Continue current regimen.  Great job with daily exercise she is felt better by doing this.  Lab Results  Component Value Date   HGBA1C 5.9 (A) 12/18/2020        Relevant Orders   Hemoglobin A1c (Completed)   CK (Creatine Kinase)   Magnesium   Ambulatory referral to Podiatry     Genitourinary   Anemia in stage 3 chronic kidney disease ([place])    Continue to monitor renal function every 6 months.        Other   Pure hypercholesterolemia   Relevant Medications   metoprolol tartrate (LOPRESSOR) 100 MG tablet   rosuvastatin (CRESTOR) 20 MG tablet   Other Relevant Orders   Lipid Panel w/reflex Direct LDL (Completed)   CK (Creatine Kinase)   Magnesium   Ambulatory referral to Podiatry   Morbid obesity [facility])    Doing absolutely fantastic with her daily exercise routine just encouraged her to continue to work on healthy food choices.      Other Visit Diagnoses     Need for immunization against influenza    -  Primary   Relevant Orders   Flu Vaccine QUAD High Dose(Fluad) (Completed)   Overactive thyroid gland       Relevant Medications   metoprolol tartrate (LOPRESSOR) 100 MG tablet   Other Relevant Orders   TSH (Completed)   Dystrophic nail       Relevant Orders   Ambulatory referral to Podiatry   Ganglion cyst of dorsum of left wrist          Still cramps-we discussed doing some additional lab work-up today including checking potassium and magnesium.  We will check electrolytes as well.  Dystrophic nails-we will place referral to podiatry for further work-up.  Ganglion cyst on left wrist-gave  reassurance.  No follow-ups on file.    Beatrice Lecher, MD

## 2022-01-02 LAB — COMPLETE METABOLIC PANEL WITH GFR
AG Ratio: 1.3 (calc) (ref 1.0–2.5)
ALT: 18 U/L (ref 6–29)
AST: 19 U/L (ref 10–35)
Albumin: 3.7 g/dL (ref 3.6–5.1)
Alkaline phosphatase (APISO): 87 U/L (ref 37–153)
BUN/Creatinine Ratio: 22 (calc) (ref 6–22)
BUN: 25 mg/dL (ref 7–25)
CO2: 30 mmol/L (ref 20–32)
Calcium: 9.4 mg/dL (ref 8.6–10.4)
Chloride: 106 mmol/L (ref 98–110)
Creat: 1.14 mg/dL — ABNORMAL HIGH (ref 0.50–1.05)
Globulin: 2.9 g/dL (calc) (ref 1.9–3.7)
Glucose, Bld: 95 mg/dL (ref 65–139)
Potassium: 3.6 mmol/L (ref 3.5–5.3)
Sodium: 142 mmol/L (ref 135–146)
Total Bilirubin: 0.3 mg/dL (ref 0.2–1.2)
Total Protein: 6.6 g/dL (ref 6.1–8.1)
eGFR: 53 mL/min/{1.73_m2} — ABNORMAL LOW (ref >=60)

## 2022-01-02 LAB — HEMOGLOBIN A1C
Hgb A1c MFr Bld: 6.1 % of total Hgb — ABNORMAL HIGH (ref <5.7)
Mean Plasma Glucose: 128 mg/dL
eAG (mmol/L): 7.1 mmol/L

## 2022-01-02 LAB — TSH: TSH: 2.46 mIU/L (ref 0.40–4.50)

## 2022-01-02 LAB — LIPID PANEL W/REFLEX DIRECT LDL
Cholesterol: 158 mg/dL (ref <200)
HDL: 46 mg/dL — ABNORMAL LOW (ref >=50)
LDL Cholesterol (Calc): 90 mg/dL (calc)
Non-HDL Cholesterol (Calc): 112 mg/dL (calc) (ref <130)
Total CHOL/HDL Ratio: 3.4 (calc) (ref <5.0)
Triglycerides: 122 mg/dL (ref <150)

## 2022-01-02 LAB — CK: Total CK: 178 U/L — ABNORMAL HIGH (ref 29–143)

## 2022-01-02 LAB — MAGNESIUM: Magnesium: 1.7 mg/dL (ref 1.5–2.5)

## 2022-01-02 NOTE — Assessment & Plan Note (Signed)
Continue to monitor renal function every 6 months.

## 2022-01-02 NOTE — Assessment & Plan Note (Signed)
Doing absolutely fantastic with her daily exercise routine just encouraged her to continue to work on healthy food choices.

## 2022-01-05 NOTE — Progress Notes (Signed)
Hi Sharon Reilly, your LDL cholesterol is up a little bit compared to last year.  It was down to 74 this time it was back to 90.  Trelegy to continue to work on healthy changes.  Kidney function is stable at 1.1.  Liver function looks normal.  A1c went up slightly as well it was 5.9 a year ago it was 6.1 this time.  Looks perfect.  Magnesium looks good.  Your CK which is a muscle enzyme was just a little bit elevated which might explain the more frequent cramps.  Make sure that you are hydrating well.  Try to do a lot of stretching on days where you are moving and walking a lot more and see if that helps reduce the cramping.

## 2022-01-06 NOTE — Progress Notes (Signed)
Please call patient. Normal mammogram.  Repeat in 1 year.  

## 2022-01-23 ENCOUNTER — Ambulatory Visit (INDEPENDENT_AMBULATORY_CARE_PROVIDER_SITE_OTHER): Payer: Medicare HMO | Admitting: Podiatrist

## 2022-01-23 DIAGNOSIS — R6889 Other general symptoms and signs: Secondary | ICD-10-CM | POA: Diagnosis not present

## 2022-01-23 DIAGNOSIS — L600 Ingrowing nail: Secondary | ICD-10-CM

## 2022-01-23 DIAGNOSIS — L603 Nail dystrophy: Secondary | ICD-10-CM | POA: Diagnosis not present

## 2022-01-23 MED ORDER — MUPIROCIN 2 % EX OINT: 1.0000 | TOPICAL_OINTMENT | Freq: Two times a day (BID) | CUTANEOUS | 2 refills | Status: DC

## 2022-01-23 NOTE — Patient Instructions (Signed)
Soak Instructions    THE DAY AFTER THE PROCEDURE  Place 1/4 cup of epsom salts in a quart of warm tap water.  Submerge your foot or feet with outer bandage intact for the initial soak; this will allow the bandage to become moist and wet for easy lift off.  Once you remove your bandage, continue to soak in the solution for 20 minutes.  This soak should be done twice a day.  Next, remove your foot or feet from solution, blot dry the affected area and cover.  Apply polysporin or neosporin.   You may use a band aid large enough to cover the area or use gauze and tape.     IF YOUR SKIN BECOMES IRRITATED WHILE USING THESE INSTRUCTIONS, IT IS OKAY TO SWITCH TO  antibacterial soap pump soap (Dial)  and water to keep the toe clean instead of soaking in epsom salts.    [place] Instructions-Post Nail Surgery  You have had your ingrown toenail and root treated with a chemical.  This chemical causes a burn that will drain and ooze like a blister.  1-2 weeks after the procedure you may leave the area open to air at night to help dry it up.  During the day,  It is important to keep this area clean and covered until the toe dries out and forms a scab. Once the scab forms you no longer need to soak or apply a dressing.  If at any time you experience an increase in pain, redness, swelling, or drainage, you should contact the office as soon as possible.

## 2022-01-23 NOTE — Progress Notes (Unsigned)
Chief Complaint  Patient presents with   Nail Problem     bil Dystrophic nail/right worse     HPI: Patient is 66 y.o. female who presents today for painful ingrowing nails bilateral first toenails- lateral borders, as well as sideways growing second toenails. She relates the toenails are bothersome especially in shoes.  She denies any drainage and relates tenderness and redness at times on the corners of both great toenails.  She does have allergies to several antibiotics and relates she is not diabetic. She also relates she was on Lamisil for about a year with no real improvement in the appearance of the toenails.    Patient Active Problem List   Diagnosis Date Noted   OAB (overactive bladder) 12/18/2020   Chronic pain of right thumb 01/75/1025   Umbilical hernia without obstruction and without gangrene 06/26/2019   Hallux limitus of right foot 03/02/2019   Onychomycosis of toenail 12/14/2018   Eczema 06/14/2018   CKD (chronic kidney disease) stage 3, GFR 30-59 ml/min (HCC) 04/08/2017   Secondary hyperparathyroidism of renal origin ([place]) 01/11/2017   Anemia in stage 3 chronic kidney disease ([place]) 11/18/2016   Hallux rigidus of right foot 09/29/2016   Primary osteoarthritis of both knees 09/29/2016   Gallstones 05/06/2016   Pure hypercholesterolemia 03/31/2016   CAD (coronary artery disease) 03/09/2016   NSTEMI (non-ST elevated myocardial infarction) ([place]) 03/08/2016   Internal hemorrhoids 10/14/2015   Gastroesophageal reflux disease with esophagitis 08/14/2015   Lymphadenopathy of right cervical region 08/05/2015   Lumbago with sciatica 11/07/2014   Left thyroid nodule 02/01/2014   Nonpalpable mass of neck 02/01/2014   OSA (obstructive sleep apnea) 10/04/2013   Lactose intolerance 05/31/2013   IFG (impaired fasting glucose) 09/01/2012   Fibromyalgia 07/10/2010   Tubular adenoma of colon 07/10/2010   Anal fistula 07/10/2010   Essential hypertension, benign 06/26/2010   Morbid  obesity ([place]) 06/26/2010   HYPERLIPIDEMIA 05/20/2010   OSTEOPOROSIS 05/20/2010    Current Outpatient Medications on File Prior to Visit  Medication Sig Dispense Refill   acetaminophen (TYLENOL) 650 MG CR tablet Take 1 tablet (650 mg total) by mouth every 8 (eight) hours as needed for pain. (Patient taking differently: Take 1,300 mg by mouth 2 (two) times daily as needed for pain.) 90 tablet 3   albuterol (VENTOLIN HFA) 108 (90 Base) MCG/ACT inhaler Inhale 2 puffs into the lungs every 6 (six) hours as needed for wheezing or shortness of breath. 1 each 2   AMBULATORY NON FORMULARY MEDICATION Medication Name: CPAP with mask and supplies and humidifier.CPAP set to 10 cm water pressure. Patient is currently on autopap. Please fax download after 1 week to or office [phone] 1 vial 0   aspirin 81 MG tablet Take 81 mg by mouth daily.     fexofenadine (ALLEGRA) 180 MG tablet Take 1 tablet (180 mg total) by mouth daily. (Patient taking differently: Take 180 mg by mouth as needed.) 30 tablet 6   fluticasone (FLOVENT HFA) 110 MCG/ACT inhaler Inhale 1 puff into the lungs daily as needed.     losartan-hydrochlorothiazide (HYZAAR) 100-25 MG tablet Take 1 tablet by mouth daily. 90 tablet 1   methimazole (TAPAZOLE) 5 MG tablet Take 5 mg by mouth daily.     metoprolol tartrate (LOPRESSOR) 100 MG tablet TAKE ONE TABLET BY MOUTH TWICE DAILY 90 tablet 3   Multiple Vitamin (MULTIVITAMIN) tablet Take 1 tablet by mouth daily.     omeprazole (PRILOSEC) 40 MG capsule Take 1 capsule (40 mg  total) by mouth daily. 90 capsule 3   rosuvastatin (CRESTOR) 20 MG tablet Take 1 tablet (20 mg total) by mouth daily. 90 tablet 3   triamcinolone cream (KENALOG) 0.5 % Apply 1 Application topically 2 (two) times daily as needed. 30 g 1   No current facility-administered medications on file prior to visit.    Allergies  Allergen Reactions   Amoxicillin Hives and Itching    No rash.    Erythromycin Hives   Nsaids Other (See  Comments)    CKD 3    Sulfonamide Derivatives    Doxycycline Hives   Sulfa Antibiotics Hives and Rash    Review of Systems No fevers, chills, nausea, muscle aches, no difficulty breathing, no calf pain, no chest pain or shortness of breath.   Physical Exam  GENERAL APPEARANCE: Alert, conversant. Appropriately groomed. No acute distress.   VASCULAR: Pedal pulses palpable 2/4 DP and 2/4 PT bilateral.  Capillary refill time is immediate to all digits,  Proximal to distal cooling is warm to warm.  Digital perfusion adequate.   NEUROLOGIC: sensation is intact to 5.07 monofilament at 5/5 sites bilateral.  Light touch is intact bilateral, vibratory sensation intact bilateral  MUSCULOSKELETAL: acceptable muscle strength, tone and stability bilateral.  No gross boney pedal deformities noted.  No pain, crepitus or limitation noted with foot and ankle range of motion bilateral.   DERMATOLOGIC: skin is warm, supple, and dry.  Color, texture, and turgor of skin within normal limits. Bilateral great toenails have incurvated and pincer lateral borders which are painful with direct pressure and palpation. No redness or sign of infection present.  Second toenails are thick and dystrophic. They grow medially towards the great toes.  They are mildly painful. Not ingrown    Assessment     ICD-10-CM   1. Ingrown right greater toenail  L60.0     2. Ingrown left greater toenail  L60.0     3. Nail dystrophy  L60.3        Plan  Treatment options and alternatives were discussed. Recommended a permanent removal of the lateral nail border of both great toenails.   Patient agreed. Skin was prepped with alcohol and a local injection of lidocaine and Marcaine plain was infiltrated to anesthetize each great toe. The toes were then prepped with Betadine and exsanguinated. The offending nail border was removed and phenol applied to the exposed matrix tissue on both great toes- lateral side.  The area was then  cleansed well with alcohol.  Antibiotic ointment and a dressing was then applied the tourniquet released  noting a prompt hyperemic response to the tip of the toes.  Oral and written instructions were dispensed and the patient was instructed on aftercare. Rx for mupirocin called in.   If there is any increased redness, swelling, drainage, pus or any other concerns arise, Yuliana will call to be seen.   I trimmed the second toenails bilateral. Discussed that due to the way they turn, there isn't much that can be done --  we could remove them permanently in the future or continue to trim them down for her.  She will call in the future if she would like further treatment and otherwise will be seen back as needed.

## 2022-03-07 ENCOUNTER — Other Ambulatory Visit: Payer: Self-pay | Admitting: Family Medicine

## 2022-03-16 ENCOUNTER — Ambulatory Visit (INDEPENDENT_AMBULATORY_CARE_PROVIDER_SITE_OTHER): Payer: Medicare HMO | Admitting: Family Medicine

## 2022-03-16 DIAGNOSIS — Z0000 Encounter for general adult medical examination without abnormal findings: Secondary | ICD-10-CM | POA: Diagnosis not present

## 2022-03-16 NOTE — Progress Notes (Signed)
MEDICARE ANNUAL WELLNESS VISIT  03/16/2022  Telephone Visit Disclaimer This Medicare AWV was conducted by telephone due to national recommendations for restrictions regarding the COVID-19 Pandemic (e.g. social distancing).  I verified, using two identifiers, that I am speaking with Sharon Reilly or their authorized healthcare agent. I discussed the limitations, risks, security, and privacy concerns of performing an evaluation and management service by telephone and the potential availability of an in-person appointment in the future. The patient expressed understanding and agreed to proceed.  Location of Patient: home Location of Provider (nurse):  In the office.  Subjective:    Sharon Reilly is a 67 y.o. female patient of Metheney, Rene Kocher, MD who had a Medicare Annual Wellness Visit today via telephone. Layah is Disabled and lives alone. she has 1 child. she reports that she is socially active and does interact with friends/family regularly. she is minimally physically active and enjoys watching movies and reading.  Patient Care Team: Hali Marry, MD as PCP - General (Family Medicine) Darius Bump, [facility] as Pharmacist (Pharmacist)     03/16/2022    9:14 AM 03/13/2021   11:06 AM 03/12/2021    9:07 AM 11/23/2019   11:35 AM 11/21/2018    9:55 AM 11/16/2017   10:34 AM 02/17/2017    1:54 PM  Advanced Directives  Does Patient Have a Medical Advance Directive? No No No No No No No  Would patient like information on creating a medical advance directive? No - Patient declined Yes (MAU/Ambulatory/Procedural Areas - Information given) No - Patient declined  No - Patient declined No - Patient declined No - Patient declined    Hospital Utilization Over the Past 12 Months: # of hospitalizations or ER visits: 0 # of surgeries: 0  Review of Systems    Patient reports that her overall health is  worse due to arthritis  compared to last year.  History obtained from chart review  and the patient  Patient Reported Readings (BP, Pulse, CBG, Weight, etc) none  Pain Assessment Pain : No/denies pain     Current Medications & Allergies (verified) Allergies as of 03/16/2022       Reactions   Amoxicillin Hives, Itching   No rash.    Erythromycin Hives   Nsaids Other (See Comments)   CKD 3    Sulfonamide Derivatives    Doxycycline Hives   Sulfa Antibiotics Hives, Rash        Medication List        Accurate as of March 16, 2022  9:30 AM. If you have any questions, ask your nurse or doctor.          acetaminophen 650 MG CR tablet Commonly known as: TYLENOL Take 1 tablet (650 mg total) by mouth every 8 (eight) hours as needed for pain. What changed:  how much to take when to take this   albuterol 108 (90 Base) MCG/ACT inhaler Commonly known as: VENTOLIN HFA Inhale 2 puffs into the lungs every 6 (six) hours as needed for wheezing or shortness of breath.   AMBULATORY NON FORMULARY MEDICATION Medication Name: CPAP with mask and supplies and humidifier.CPAP set to 10 cm water pressure. Patient is currently on autopap. Please fax download after 1 week to or office [phone]   aspirin 81 MG tablet Take 81 mg by mouth daily.   fexofenadine 180 MG tablet Commonly known as: ALLEGRA Take 1 tablet (180 mg total) by mouth daily. What changed:  when to take  this reasons to take this   fluticasone 110 MCG/ACT inhaler Commonly known as: FLOVENT HFA Inhale 1 puff into the lungs daily as needed.   losartan-hydrochlorothiazide 100-25 MG tablet Commonly known as: HYZAAR Take 1 tablet by mouth daily.   methimazole 5 MG tablet Commonly known as: TAPAZOLE Take 5 mg by mouth daily. Does not take any on sundays   metoprolol tartrate 100 MG tablet Commonly known as: LOPRESSOR TAKE ONE TABLET BY MOUTH TWICE DAILY   multivitamin tablet Take 1 tablet by mouth daily.   mupirocin ointment 2 % Commonly known as: BACTROBAN Apply 1 Application  topically 2 (two) times daily. Apply to sides of toenails after cleansing and cover with bandaid.   omeprazole 40 MG capsule Commonly known as: PRILOSEC Take 1 capsule (40 mg total) by mouth daily.   rosuvastatin 20 MG tablet Commonly known as: CRESTOR Take 1 tablet (20 mg total) by mouth daily.   triamcinolone cream 0.5 % Commonly known as: KENALOG Apply 1 Application topically 2 (two) times daily as needed.        History (reviewed): Past Medical History:  Diagnosis Date   Fibromyalgia    Hyperlipidemia    Hypertension    Lupus ([place])    Lupus (systemic lupus erythematosus) ([place])    NSTEMI (non-ST elevated myocardial infarction) ([place]) 03/08/2016   [facility]   Obesity    Osteoarthritis    Osteoporosis    Past Surgical History:  Procedure Laterality Date   ABDOMINAL HYSTERECTOMY     partial   CARPAL TUNNEL RELEASE     left   CHOLECYSTECTOMY Bilateral 05/14/2016   epicondylitis     left   fistulotomy     great toe fusion     left   MRI syr ortho spec     Dr. Christen Butter   REDUCTION MAMMAPLASTY Bilateral    right elbow     surgisis biodesign fistula plug     talonavicular joint fusion     LT foot   Family History  Problem Relation Age of Onset   Breast cancer Mother        Diagnosed in her 69's.   Hyperlipidemia Mother    Hypertension Mother    Hyperlipidemia Father    Diabetes Brother    Diabetes Other        aunt   Kidney failure Brother    Thyroid disease Sister    Social History   Socioeconomic History   Marital status: Single    Spouse name: Not on file   Number of children: 1   Years of education: 2 years college   Highest education level: 12th grade  Occupational History   Occupation: disabled    Comment: insurance  Tobacco Use   Smoking status: Never   Smokeless tobacco: Never  Vaping Use   Vaping Use: Never used  Substance and Sexual Activity   Alcohol use: No   Drug use: No   Sexual activity: Never  Other Topics Concern   Not on file   Social History Narrative   Lives in apartment at a senior living area. She has one son, he lives in [place]. Her sister lives close by incase she needs anything. She enjoys watching movies and reading.   Social Determinants of Health   Financial Resource Strain: Low Risk  (03/16/2022)   Overall Financial Resource Strain (CARDIA)    Difficulty of Paying Living Expenses: Not hard at all  Food Insecurity: No Food Insecurity (03/16/2022)   Hunger Vital  Sign    Worried About Charity fundraiser in the Last Year: Never true    [place] in the Last Year: Never true  Transportation Needs: No Transportation Needs (03/16/2022)   PRAPARE - Hydrologist (Medical): No    Lack of Transportation (Non-Medical): No  Physical Activity: Inactive (03/16/2022)   Exercise Vital Sign    Days of Exercise per Week: 0 days    Minutes of Exercise per Session: 0 min  Stress: No Stress Concern Present (03/16/2022)   [place]    Feeling of Stress : Not at all  Social Connections: Socially Isolated (03/16/2022)   Social Connection and Isolation Panel [NHANES]    Frequency of Communication with Friends and Family: More than three times a week    Frequency of Social Gatherings with Friends and Family: Twice a week    Attends Religious Services: Never    Marine scientist or Organizations: No    Attends Archivist Meetings: Never    Marital Status: Never married    Activities of Daily Living    03/16/2022    9:20 AM  In your present state of health, do you have any difficulty performing the following activities:  Hearing? 0  Vision? 0  Difficulty concentrating or making decisions? 0  Walking or climbing stairs? 1  Comment mostly walking difficuly  Dressing or bathing? 0  Doing errands, shopping? 1  Comment does not drive  Preparing Food and eating ? N  Using the Toilet? N  In the past six  months, have you accidently leaked urine? N  Do you have problems with loss of bowel control? N  Managing your Medications? N  Managing your Finances? N  Housekeeping or managing your Housekeeping? N    Patient Education/ Literacy How often do you need to have someone help you when you read instructions, pamphlets, or other written materials from your doctor or pharmacy?: 1 - Never What is the last grade level you completed in school?: 2 years of college  Exercise Current Exercise Habits: Home exercise routine, Type of exercise: Other - see comments (chair and standing exercises), Time (Minutes): 60, Frequency (Times/Week): 5, Weekly Exercise (Minutes/Week): 300, Intensity: Mild, Exercise limited by: orthopedic condition(s)  Diet Patient reports consuming  1-2  meals a day and 2 snack(s) a day Patient reports that her primary diet is: Regular Patient reports that she does have regular access to food.   Depression Screen    03/16/2022    9:14 AM 01/01/2022    1:53 PM 03/12/2021    9:08 AM 02/21/2021   10:01 AM 12/18/2020   10:32 AM 11/23/2019   12:20 PM 06/14/2019    2:39 PM  PHQ 2/9 Scores  PHQ - 2 Score 0 1 0 0 0 0 1     Fall Risk    03/16/2022    9:14 AM 01/01/2022    1:53 PM 03/12/2021    9:08 AM 02/21/2021   10:01 AM 12/18/2020   10:32 AM  Fall Risk   Falls in the past year? 0 0 0 0 0  Number falls in past yr: 0 0 0 0 0  Injury with Fall? 0 0 0 0 0  Risk for fall due to : Impaired mobility No Fall Risks No Fall Risks No Fall Risks No Fall Risks  Follow up Falls evaluation completed;Education provided;Falls prevention discussed Falls evaluation completed  Falls evaluation completed Falls prevention discussed;Falls evaluation completed Falls prevention discussed;Falls evaluation completed     Objective:  SHUNDA RABADI seemed alert and oriented and she participated appropriately during our telephone visit.  Blood Pressure Weight BMI  BP Readings from Last 3 Encounters:   01/01/22 (!) 157/64  01/15/21 (!) 120/41  12/18/20 (!) 118/45   Wt Readings from Last 3 Encounters:  01/01/22 249 lb (112.9 kg)  02/21/21 238 lb (108 kg)  01/15/21 238 lb (108 kg)   BMI Readings from Last 1 Encounters:  01/01/22 47.05 kg/m    *Unable to obtain current vital signs, weight, and BMI due to telephone visit type  Hearing/Vision  [name] did not seem to have difficulty with hearing/understanding during the telephone conversation Reports that she has had a formal eye exam by an eye care professional within the past year Reports that she has had a formal hearing evaluation within the past year *Unable to fully assess hearing and vision during telephone visit type  Cognitive Function:    03/16/2022    9:25 AM 03/12/2021    9:15 AM 11/23/2019   11:34 AM 11/21/2018    9:58 AM 11/16/2017   10:39 AM  6CIT Screen  What Year? 0 points 0 points 0 points 0 points 0 points  What month? 0 points 0 points 0 points 0 points 0 points  What time? 0 points 0 points 0 points 0 points 0 points  Count back from 20 0 points 0 points 0 points 0 points 0 points  Months in reverse 0 points 0 points 0 points 0 points 0 points  Repeat phrase 0 points 0 points 6 points 0 points 0 points  Total Score 0 points 0 points 6 points 0 points 0 points   (Normal:0-7, Significant for Dysfunction: >8)  Normal Cognitive Function Screening: Yes   Immunization & Health Maintenance Record Immunization History  Administered Date(s) Administered   Fluad Quad(high Dose 65+) 11/21/2018, 12/18/2020, 01/01/2022   Influenza Split 12/24/2010   Influenza, High Dose Seasonal PF 11/16/2017   Influenza,inj,Quad PF,6+ Mos 11/29/2012, 10/04/2013, 11/16/2014, 11/21/2015, 10/13/2016, 11/23/2019   Influenza-Unspecified 11/29/2012, 10/04/2013, 11/16/2014, 11/21/2015   Moderna Covid-19 Vaccine Bivalent Booster 19yr & up 01/30/2020   Moderna Sars-Covid-2 Vaccination 06/01/2019, 07/03/2019   Td 07/04/2010   Tdap  06/24/2021   Zoster Recombinat (Shingrix) 04/16/2021, 06/24/2021    Health Maintenance  Topic Date Due   COVID-19 Vaccine (4 - 2023-24 season) 04/01/2022 (Originally 10/17/2021)   Pneumonia Vaccine 67 Years old (1 - PCV) 01/02/2023 (Originally 07/05/2020)   Medicare Annual Wellness (AWV)  03/17/2023   MAMMOGRAM  01/02/2024   COLONOSCOPY (Pts 45-454yrInsurance coverage will need to be confirmed)  11/01/2025   DTaP/Tdap/Td (3 - Td or Tdap) 06/25/2031   INFLUENZA VACCINE  Completed   DEXA SCAN  Completed   Hepatitis C Screening  Completed   Zoster Vaccines- Shingrix  Completed   HPV VACCINES  Aged Out       Assessment  This is a routine wellness examination for LiJamas Reilly Health Maintenance: Due or Overdue There are no preventive care reminders to display for this patient.   LiJamas Lavoes not need a referral for Community Assistance: Care Management:   no Social Work:    no Prescription Assistance:  no Nutrition/Diabetes Education:  no   Plan:  Personalized Goals  Goals Addressed               This Visit's Progress  Patient Stated (pt-stated)        03/16/2022 AWV Goal: Exercise for General Health  Patient will verbalize understanding of the benefits of increased physical activity: Exercising regularly is important. It will improve your overall fitness, flexibility, and endurance. Regular exercise also will improve your overall health. It can help you control your weight, reduce stress, and improve your bone density. Over the next year, patient will increase physical activity as tolerated with a goal of at least 150 minutes of moderate physical activity per week.  You can tell that you are exercising at a moderate intensity if your heart starts beating faster and you start breathing faster but can still hold a conversation. Moderate-intensity exercise ideas include: Walking 1 mile (1.6 km) in about 15 minutes Biking Hiking Golfing Dancing Water  aerobics Patient will verbalize understanding of everyday activities that increase physical activity by providing examples like the following: Yard work, such as: Sales promotion account executive Gardening Washing windows or floors Patient will be able to explain general safety guidelines for exercising:  Before you start a new exercise program, talk with your health care provider. Do not exercise so much that you hurt yourself, feel dizzy, or get very short of breath. Wear comfortable clothes and wear shoes with good support. Drink plenty of water while you exercise to prevent dehydration or heat stroke. Work out until your breathing and your heartbeat get faster.        Personalized Health Maintenance & Screening Recommendations  Pneumococcal vaccine - declines   Lung Cancer Screening Recommended: no (Low Dose CT Chest recommended if Age 24-80 years, 30 pack-year currently smoking OR have quit w/in past 15 years) Hepatitis C Screening recommended: no HIV Screening recommended: no  Advanced Directives: Written information was not prepared per patient's request.  Referrals & Orders No orders of the defined types were placed in this encounter.   Follow-up Plan Follow-up with Hali Marry, MD as planned Medicare wellness visit in one year. Patient will access AVS on my chart.   I have personally reviewed and noted the following in the patient's chart:   Medical and social history Use of alcohol, tobacco or illicit drugs  Current medications and supplements Functional ability and status Nutritional status Physical activity Advanced directives List of other physicians Hospitalizations, surgeries, and ER visits in previous 12 months Vitals Screenings to include cognitive, depression, and falls Referrals and appointments  In addition, I have reviewed and discussed with Sharon Reilly certain  preventive protocols, quality metrics, and best practice recommendations. A written personalized care plan for preventive services as well as general preventive health recommendations is available and can be mailed to the patient at her request.      Tinnie Gens, RN BSN  03/16/2022

## 2022-03-16 NOTE — Patient Instructions (Addendum)
[place] Maintenance Summary and Written Plan of Care  [name] ,  Thank you for allowing me to perform your Medicare Annual Wellness Visit and for your ongoing commitment to your health.   Health Maintenance & Immunization History Health Maintenance  Topic Date Due   COVID-19 Vaccine (4 - 2023-24 season) 04/01/2022 (Originally 10/17/2021)   Pneumonia Vaccine 21+ Years old (1 - PCV) 01/02/2023 (Originally 07/05/2020)   Medicare Annual Wellness (AWV)  03/17/2023   MAMMOGRAM  01/02/2024   COLONOSCOPY (Pts 45-54yr Insurance coverage will need to be confirmed)  11/01/2025   DTaP/Tdap/Td (3 - Td or Tdap) 06/25/2031   INFLUENZA VACCINE  Completed   DEXA SCAN  Completed   Hepatitis C Screening  Completed   Zoster Vaccines- Shingrix  Completed   HPV VACCINES  Aged Out   Immunization History  Administered Date(s) Administered   Fluad Quad(high Dose 65+) 11/21/2018, 12/18/2020, 01/01/2022   Influenza Split 12/24/2010   Influenza, High Dose Seasonal PF 11/16/2017   Influenza,inj,Quad PF,6+ Mos 11/29/2012, 10/04/2013, 11/16/2014, 11/21/2015, 10/13/2016, 11/23/2019   Influenza-Unspecified 11/29/2012, 10/04/2013, 11/16/2014, 11/21/2015   Moderna Covid-19 Vaccine Bivalent Booster 150yr& up 01/30/2020   Moderna Sars-Covid-2 Vaccination 06/01/2019, 07/03/2019   Td 07/04/2010   Tdap 06/24/2021   Zoster Recombinat (Shingrix) 04/16/2021, 06/24/2021    These are the patient goals that we discussed:  Goals Addressed               This Visit's Progress     Patient Stated (pt-stated)        03/16/2022 AWV Goal: Exercise for General Health  Patient will verbalize understanding of the benefits of increased physical activity: Exercising regularly is important. It will improve your overall fitness, flexibility, and endurance. Regular exercise also will improve your overall health. It can help you control your weight, reduce stress, and improve your bone  density. Over the next year, patient will increase physical activity as tolerated with a goal of at least 150 minutes of moderate physical activity per week.  You can tell that you are exercising at a moderate intensity if your heart starts beating faster and you start breathing faster but can still hold a conversation. Moderate-intensity exercise ideas include: Walking 1 mile (1.6 km) in about 15 minutes Biking Hiking Golfing Dancing Water aerobics Patient will verbalize understanding of everyday activities that increase physical activity by providing examples like the following: Yard work, such as: PuSales promotion account executiveardening Washing windows or floors Patient will be able to explain general safety guidelines for exercising:  Before you start a new exercise program, talk with your health care provider. Do not exercise so much that you hurt yourself, feel dizzy, or get very short of breath. Wear comfortable clothes and wear shoes with good support. Drink plenty of water while you exercise to prevent dehydration or heat stroke. Work out until your breathing and your heartbeat get faster.          This is a list of Health Maintenance Items that are overdue or due now:  Pneumococcal vaccine - declines   Orders/Referrals Placed Today: No orders of the defined types were placed in this encounter.  (Contact our referral department at [phone] you have not spoken with someone about your referral appointment within the next 5 days)    Follow-up Plan Follow-up with MeHali MarryMD as planned Medicare wellness visit in one year. Patient  will access AVS on my chart.      Health Maintenance, Female Adopting a healthy lifestyle and getting preventive care are important in promoting health and wellness. Ask your health care provider about: The right schedule for you to have regular  tests and exams. Things you can do on your own to prevent diseases and keep yourself healthy. What should I know about diet, weight, and exercise? Eat a healthy diet  Eat a diet that includes plenty of vegetables, fruits, low-fat dairy products, and lean protein. Do not eat a lot of foods that are high in solid fats, added sugars, or sodium. Maintain a healthy weight Body mass index (BMI) is used to identify weight problems. It estimates body fat based on height and weight. Your health care provider can help determine your BMI and help you achieve or maintain a healthy weight. Get regular exercise Get regular exercise. This is one of the most important things you can do for your health. Most adults should: Exercise for at least 150 minutes each week. The exercise should increase your heart rate and make you sweat (moderate-intensity exercise). Do strengthening exercises at least twice a week. This is in addition to the moderate-intensity exercise. Spend less time sitting. Even light physical activity can be beneficial. Watch cholesterol and blood lipids Have your blood tested for lipids and cholesterol at 67 years of age, then have this test every 5 years. Have your cholesterol levels checked more often if: Your lipid or cholesterol levels are high. You are older than 67 years of age. You are at high risk for heart disease. What should I know about cancer screening? Depending on your health history and family history, you may need to have cancer screening at various ages. This may include screening for: Breast cancer. Cervical cancer. Colorectal cancer. Skin cancer. Lung cancer. What should I know about heart disease, diabetes, and high blood pressure? Blood pressure and heart disease High blood pressure causes heart disease and increases the risk of stroke. This is more likely to develop in people who have high blood pressure readings or are overweight. Have your blood pressure  checked: Every 3-5 years if you are 83-30 years of age. Every year if you are 78 years old or older. Diabetes Have regular diabetes screenings. This checks your fasting blood sugar level. Have the screening done: Once every three years after age 22 if you are at a normal weight and have a low risk for diabetes. More often and at a younger age if you are overweight or have a high risk for diabetes. What should I know about preventing infection? Hepatitis B If you have a higher risk for hepatitis B, you should be screened for this virus. Talk with your health care provider to find out if you are at risk for hepatitis B infection. Hepatitis C Testing is recommended for: Everyone born from [birth year] through 1965. Anyone with known risk factors for hepatitis C. Sexually transmitted infections (STIs) Get screened for STIs, including gonorrhea and chlamydia, if: You are sexually active and are younger than 68 years of age. You are older than 67 years of age and your health care provider tells you that you are at risk for this type of infection. Your sexual activity has changed since you were last screened, and you are at increased risk for chlamydia or gonorrhea. Ask your health care provider if you are at risk. Ask your health care provider about whether you are at high risk for HIV. Your  health care provider may recommend a prescription medicine to help prevent HIV infection. If you choose to take medicine to prevent HIV, you should first get tested for HIV. You should then be tested every 3 months for as long as you are taking the medicine. Pregnancy If you are about to stop having your period (premenopausal) and you may become pregnant, seek counseling before you get pregnant. Take 400 to 800 micrograms (mcg) of folic acid every day if you become pregnant. Ask for birth control (contraception) if you want to prevent pregnancy. Osteoporosis and menopause Osteoporosis is a disease in which the bones  lose minerals and strength with aging. This can result in bone fractures. If you are 42 years old or older, or if you are at risk for osteoporosis and fractures, ask your health care provider if you should: Be screened for bone loss. Take a calcium or vitamin D supplement to lower your risk of fractures. Be given hormone replacement therapy (HRT) to treat symptoms of menopause. Follow these instructions at home: Alcohol use Do not drink alcohol if: Your health care provider tells you not to drink. You are pregnant, may be pregnant, or are planning to become pregnant. If you drink alcohol: Limit how much you have to: 0-1 drink a day. Know how much alcohol is in your drink. In the [place] one drink equals one 12 oz bottle of beer (355 mL), one 5 oz glass of wine (148 mL), or one 1 oz glass of hard liquor (44 mL). Lifestyle Do not use any products that contain nicotine or tobacco. These products include cigarettes, chewing tobacco, and vaping devices, such as e-cigarettes. If you need help quitting, ask your health care provider. Do not use street drugs. Do not share needles. Ask your health care provider for help if you need support or information about quitting drugs. General instructions Schedule regular health, dental, and eye exams. Stay current with your vaccines. Tell your health care provider if: You often feel depressed. You have ever been abused or do not feel safe at home. Summary Adopting a healthy lifestyle and getting preventive care are important in promoting health and wellness. Follow your health care provider's instructions about healthy diet, exercising, and getting tested or screened for diseases. Follow your health care provider's instructions on monitoring your cholesterol and blood pressure. This information is not intended to replace advice given to you by your health care provider. Make sure you discuss any questions you have with your health care provider. Document  Revised: 06/24/2020 Document Reviewed: 06/24/2020 Elsevier Patient Education  [place].

## 2022-04-24 ENCOUNTER — Ambulatory Visit (INDEPENDENT_AMBULATORY_CARE_PROVIDER_SITE_OTHER): Payer: Medicare HMO | Admitting: Podiatry

## 2022-04-24 ENCOUNTER — Encounter: Payer: Self-pay | Admitting: Podiatry

## 2022-04-24 DIAGNOSIS — M79674 Pain in right toe(s): Secondary | ICD-10-CM

## 2022-04-24 DIAGNOSIS — B351 Tinea unguium: Secondary | ICD-10-CM

## 2022-04-24 DIAGNOSIS — R6889 Other general symptoms and signs: Secondary | ICD-10-CM | POA: Diagnosis not present

## 2022-04-24 DIAGNOSIS — M79675 Pain in left toe(s): Secondary | ICD-10-CM | POA: Diagnosis not present

## 2022-04-24 DIAGNOSIS — L603 Nail dystrophy: Secondary | ICD-10-CM

## 2022-04-24 NOTE — Progress Notes (Signed)
  Subjective:  Patient ID: Sharon Reilly, female    DOB: 05/01/1955,   MRN: [id number]  Chief Complaint  Patient presents with   Nail Problem     Routine foot care    67 y.o. female presents for concern of thickened elongated and painful nails that are difficult to trim. Requesting to have them trimmed today. Relates burning and tingling in their feet.   PCP:  Hali Marry, MD    . Denies any other pedal complaints. Denies n/v/f/c.   Past Medical History:  Diagnosis Date   Fibromyalgia    Hyperlipidemia    Hypertension    Lupus ([place])    Lupus (systemic lupus erythematosus) ([place])    NSTEMI (non-ST elevated myocardial infarction) ([place]) 03/08/2016   [facility]   Obesity    Osteoarthritis    Osteoporosis     Objective:  Physical Exam: Vascular: DP/PT pulses 2/4 bilateral. CFT <3 seconds. Absent hair growth on digits. Edema noted to bilateral lower extremities. Xerosis noted bilaterally.  Skin. No lacerations or abrasions bilateral feet. Nails 1-5 bilateral  are thickened discolored and elongated with subungual debris.  Musculoskeletal: MMT 5/5 bilateral lower extremities in DF, PF, Inversion and Eversion. Deceased ROM in DF of ankle joint.  Neurological: Sensation intact to light touch. Protective sensation intact bilateral.    Assessment:   1. Nail dystrophy   2. Pain due to onychomycosis of toenails of both feet      Plan:  Patient was evaluated and treated and all questions answered. -Mechanically debrided all nails 1-5 bilateral using sterile nail nipper and filed with dremel without incident  -Answered all patient questions -Patient to return  in 3 months for at risk foot care -Patient advised to call the office if any problems or questions arise in the meantime.   Lorenda Peck, DPM

## 2022-04-29 ENCOUNTER — Other Ambulatory Visit: Payer: Self-pay | Admitting: Pharmacist

## 2022-04-29 NOTE — Patient Instructions (Signed)
Sharon Reilly,  Thank you for speaking with me today. As discussed, keep up the great work with taking medications regularly, reducing salt intake, and doing the chair exercises to stay physically active. Possibly have a friend or family member help you with the blood pressure cuff to check your home readings occasionally. Let us know if you need anything, and call [phone] to get back on Dr. Gardiner Ramus schedule for just your routine follow ups.   Take care, Luana Shu, PharmD Clinical Pharmacist [facility] [phone]

## 2022-04-29 NOTE — Progress Notes (Signed)
Patient appearing on report for [facility] - Hypertension Control report due to last documented ambulatory blood pressure of 157/64 on 01/01/22. Next appointment with PCP is not scheduled   Outreached patient to discuss hypertension control and medication management.   Current antihypertensives: losartan-hctz 100-'25mg'$  daily, metoprolol '100mg'$  BID  Patient has an automated upper arm home BP machine.  Current blood pressure readings: not currently checking, patient states it is challenging to get the cuff in proper placement  Patient denies hypotensive signs and symptoms including dizziness, lightheadedness.  Patient denies hypertensive symptoms including headache, chest pain, shortness of breath.     Assessment/Plan: - Currently unknown control, though elevated reading from 01/01/22 is more of an outlier. - - Reviewed goal blood pressure <140/90 - Reviewed appropriate administration of medication regimen - Reviewed to check blood pressure periodically with help of friend or family member, document, and provide at next provider visit - Discussed dietary modifications, such as reduced salt intake, focus on whole grains, vegetables, lean proteins - Discussed goal of 150 minutes of moderate intensity physical activity weekly - Recommend continue current regimen  Larinda Buttery, PharmD Clinical Pharmacist [facility] [phone]

## 2022-06-08 DIAGNOSIS — M329 Systemic lupus erythematosus, unspecified: Secondary | ICD-10-CM | POA: Diagnosis not present

## 2022-06-08 DIAGNOSIS — Z6841 Body Mass Index (BMI) 40.0 and over, adult: Secondary | ICD-10-CM | POA: Diagnosis not present

## 2022-06-08 DIAGNOSIS — E0590 Thyrotoxicosis, unspecified without thyrotoxic crisis or storm: Secondary | ICD-10-CM | POA: Diagnosis not present

## 2022-06-30 DIAGNOSIS — R6889 Other general symptoms and signs: Secondary | ICD-10-CM | POA: Diagnosis not present

## 2022-07-23 ENCOUNTER — Other Ambulatory Visit: Payer: Self-pay | Admitting: Family Medicine

## 2022-07-23 DIAGNOSIS — I10 Essential (primary) hypertension: Secondary | ICD-10-CM

## 2022-07-24 ENCOUNTER — Encounter: Payer: Self-pay | Admitting: Podiatry

## 2022-07-24 ENCOUNTER — Ambulatory Visit (INDEPENDENT_AMBULATORY_CARE_PROVIDER_SITE_OTHER): Payer: Medicare HMO | Admitting: Podiatry

## 2022-07-24 DIAGNOSIS — L03032 Cellulitis of left toe: Secondary | ICD-10-CM | POA: Diagnosis not present

## 2022-07-24 DIAGNOSIS — L603 Nail dystrophy: Secondary | ICD-10-CM | POA: Diagnosis not present

## 2022-07-24 DIAGNOSIS — L03031 Cellulitis of right toe: Secondary | ICD-10-CM | POA: Diagnosis not present

## 2022-07-24 DIAGNOSIS — M79674 Pain in right toe(s): Secondary | ICD-10-CM | POA: Diagnosis not present

## 2022-07-24 DIAGNOSIS — B351 Tinea unguium: Secondary | ICD-10-CM

## 2022-07-24 DIAGNOSIS — R6889 Other general symptoms and signs: Secondary | ICD-10-CM | POA: Diagnosis not present

## 2022-07-24 DIAGNOSIS — M79675 Pain in left toe(s): Secondary | ICD-10-CM | POA: Diagnosis not present

## 2022-07-24 NOTE — Progress Notes (Signed)
  Subjective:  Patient ID: Sharon Reilly, female    DOB: Mar 23, 1955,   MRN: [id number]  Chief Complaint  Patient presents with   Nail Problem    Bilateral nail fungus     66 y.o. female presents for concern of thickened elongated and painful nails that are difficult to trim. Relates not improving.   PCP:  Agapito Games, MD    . Denies any other pedal complaints. Denies n/v/f/c.   Past Medical History:  Diagnosis Date   Fibromyalgia    Hyperlipidemia    Hypertension    Lupus ([facility])    Lupus (systemic lupus erythematosus) ([facility])    NSTEMI (non-ST elevated myocardial infarction) ([facility]) 03/08/2016   [facility]   Obesity    Osteoarthritis    Osteoporosis     Objective:  Physical Exam: Vascular: DP/PT pulses 2/4 bilateral. CFT <3 seconds. Absent hair growth on digits. Edema noted to bilateral lower extremities. Xerosis noted bilaterally.  Skin. No lacerations or abrasions bilateral feet. Nails 1 bilateral  are thickened discolored and elongated with subungual debris.  Musculoskeletal: MMT 5/5 bilateral lower extremities in DF, PF, Inversion and Eversion. Deceased ROM in DF of ankle joint.  Neurological: Sensation intact to light touch. Protective sensation intact bilateral.    Assessment:   1. Nail dystrophy   2. Pain due to onychomycosis of toenails of both feet       Plan:  Patient was evaluated and treated and all questions answered. -Examined patient -Discussed treatment options for painful dystrophic nails  -Clinical picture and Fungal culture was obtained by removing a portion of the hard nail itself from each of the involved toenails using a sterile nail nipper and sent to [facility] lab. Patient tolerated the biopsy procedure well without discomfort or need for anesthesia.  -Discussed fungal nail treatment options including oral, topical, and laser treatments.  -Patient to return in 4 weeks for follow up evaluation and discussion of fungal culture results or sooner if  symptoms worsen.    Louann Sjogren, DPM

## 2022-07-24 NOTE — Addendum Note (Signed)
Addended by: Hedy Jacob on: 07/24/2022 01:01 PM   Modules accepted: Orders

## 2022-08-25 ENCOUNTER — Telehealth: Payer: Self-pay | Admitting: Family Medicine

## 2022-08-25 DIAGNOSIS — E0590 Thyrotoxicosis, unspecified without thyrotoxic crisis or storm: Secondary | ICD-10-CM

## 2022-08-25 DIAGNOSIS — E041 Nontoxic single thyroid nodule: Secondary | ICD-10-CM

## 2022-08-25 NOTE — Telephone Encounter (Addendum)
Pt called.  She needs  a referral to her Endocrinologist to continue seeing him for her thyroid. She believes it is because she changed insurances. Phone contact : [phone] Dr. W[place] Fillers [facility]

## 2022-08-26 NOTE — Telephone Encounter (Signed)
Referral placed and routed to Tosha I.  

## 2022-08-28 ENCOUNTER — Encounter: Payer: Self-pay | Admitting: Podiatry

## 2022-08-28 ENCOUNTER — Ambulatory Visit (INDEPENDENT_AMBULATORY_CARE_PROVIDER_SITE_OTHER): Payer: Medicare HMO | Admitting: Podiatry

## 2022-08-28 ENCOUNTER — Other Ambulatory Visit: Payer: Self-pay | Admitting: Family Medicine

## 2022-08-28 DIAGNOSIS — L603 Nail dystrophy: Secondary | ICD-10-CM

## 2022-08-28 DIAGNOSIS — B351 Tinea unguium: Secondary | ICD-10-CM

## 2022-08-28 DIAGNOSIS — R6889 Other general symptoms and signs: Secondary | ICD-10-CM | POA: Diagnosis not present

## 2022-08-28 DIAGNOSIS — M79674 Pain in right toe(s): Secondary | ICD-10-CM

## 2022-08-28 DIAGNOSIS — M79675 Pain in left toe(s): Secondary | ICD-10-CM

## 2022-08-28 DIAGNOSIS — I10 Essential (primary) hypertension: Secondary | ICD-10-CM

## 2022-08-28 MED ORDER — MUPIROCIN 2 % EX OINT: 1.0000 | TOPICAL_OINTMENT | Freq: Two times a day (BID) | CUTANEOUS | 2 refills | Status: DC

## 2022-08-28 NOTE — Progress Notes (Signed)
  Subjective:  Patient ID: Sharon Reilly, female    DOB: [birth year],   MRN: [id number]  Chief Complaint  Patient presents with   Nail Problem     Nail fungal culture f/u     67 y.o. female presents for follow-up of dystrophic nails and to discuss culture results.   PCP:  Agapito Games, MD    . Denies any other pedal complaints. Denies n/v/f/c.   Past Medical History:  Diagnosis Date   Fibromyalgia    Hyperlipidemia    Hypertension    Lupus ([facility])    Lupus (systemic lupus erythematosus) ([facility])    NSTEMI (non-ST elevated myocardial infarction) ([facility]) 03/08/2016   [facility]   Obesity    Osteoarthritis    Osteoporosis     Objective:  Physical Exam: Vascular: DP/PT pulses 2/4 bilateral. CFT <3 seconds. Absent hair growth on digits. Edema noted to bilateral lower extremities. Xerosis noted bilaterally.  Skin. No lacerations or abrasions bilateral feet. Nails 1 bilateral  are thickened discolored and elongated with subungual debris.  Musculoskeletal: MMT 5/5 bilateral lower extremities in DF, PF, Inversion and Eversion. Deceased ROM in DF of ankle joint.  Neurological: Sensation intact to light touch. Protective sensation intact bilateral.    Assessment:   1. Nail dystrophy   2. Pain due to onychomycosis of toenails of both feet        Plan:  Patient was evaluated and treated and all questions answered. -Examined patient -Discussed treatment options for painful dystrophic nails  -Culture shows periungual abscess and no signs of fungus although cannot be ruled out.  -Mupirocin prescribed to be applied daily.  -Will follow-up in 3 months for rfc and nail check.     Louann Sjogren, DPM

## 2022-08-31 NOTE — Telephone Encounter (Signed)
Referral, clinical notes, and copies of insurance cards have been faxed to [facility] at [phone]. Office will contact patient to schedule referral appointment.

## 2022-09-07 ENCOUNTER — Ambulatory Visit (INDEPENDENT_AMBULATORY_CARE_PROVIDER_SITE_OTHER): Payer: Medicare HMO | Admitting: Family Medicine

## 2022-09-07 ENCOUNTER — Encounter: Payer: Self-pay | Admitting: Family Medicine

## 2022-09-07 VITALS — BP 133/72 | HR 62 | Ht 61.0 in | Wt 251.0 lb

## 2022-09-07 DIAGNOSIS — E0590 Thyrotoxicosis, unspecified without thyrotoxic crisis or storm: Secondary | ICD-10-CM

## 2022-09-07 DIAGNOSIS — M545 Low back pain, unspecified: Secondary | ICD-10-CM | POA: Diagnosis not present

## 2022-09-07 DIAGNOSIS — H65192 Other acute nonsuppurative otitis media, left ear: Secondary | ICD-10-CM | POA: Diagnosis not present

## 2022-09-07 DIAGNOSIS — I10 Essential (primary) hypertension: Secondary | ICD-10-CM

## 2022-09-07 DIAGNOSIS — R6889 Other general symptoms and signs: Secondary | ICD-10-CM | POA: Diagnosis not present

## 2022-09-07 DIAGNOSIS — G4733 Obstructive sleep apnea (adult) (pediatric): Secondary | ICD-10-CM | POA: Diagnosis not present

## 2022-09-07 DIAGNOSIS — R7301 Impaired fasting glucose: Secondary | ICD-10-CM

## 2022-09-07 DIAGNOSIS — M25511 Pain in right shoulder: Secondary | ICD-10-CM | POA: Diagnosis not present

## 2022-09-07 DIAGNOSIS — N1831 Chronic kidney disease, stage 3a: Secondary | ICD-10-CM | POA: Diagnosis not present

## 2022-09-07 DIAGNOSIS — G8929 Other chronic pain: Secondary | ICD-10-CM

## 2022-09-07 DIAGNOSIS — D631 Anemia in chronic kidney disease: Secondary | ICD-10-CM

## 2022-09-07 MED ORDER — METHIMAZOLE 5 MG PO TABS: ORAL_TABLET | ORAL | Status: AC

## 2022-09-07 MED ORDER — AMBULATORY NON FORMULARY MEDICATION
0 refills | Status: AC
Start: 2022-09-07 — End: ?

## 2022-09-07 NOTE — Progress Notes (Signed)
Established Patient Office Visit  Subjective   Patient ID: Sharon Reilly, female    DOB: [birth year]  Age: 67 y.o. MRN: [id number]  Chief Complaint  Patient presents with   Hip Pain   Shoulder Injury   Back Pain    HPI She reports has been having right shoulder pain x 2 months.  No specific injury or trauma.  She has been trying to do some exercises with 3 pound weights and certain movements are just more painful.  She was try to work through the pain.  Is also been having some low back pain.  She did have some old x-rays on file from 2018 showing some degenerative disc as well as some anterior listhesis.  She has been trying to do some chair exercises but feels like it might actually be aggravating her back.  Ear fullness-it has been going on for a while as well.  No drainage or pain but just feels like she cannot hear out of them as well.  Impaired fasting glucose-no increased thirst or urination. No symptoms consistent with hypoglycemia.     ROS    Objective:     BP 133/72 (BP Location: Left Arm, Patient Position: Sitting, Cuff Size: Large)   Pulse 62   Ht 5\' 1"  (1.549 m)   Wt 251 lb (113.9 kg)   SpO2 98%   BMI 47.43 kg/m    Physical Exam Vitals and nursing note reviewed.  Constitutional:      Appearance: She is well-developed.  HENT:     Head: Normocephalic and atraumatic.     Right Ear: Tympanic membrane, ear canal and external ear normal.     Left Ear: Ear canal and external ear normal.     Ears:     Comments: Effusion in the left ear.  Cardiovascular:     Rate and Rhythm: Normal rate and regular rhythm.     Heart sounds: Normal heart sounds.  Pulmonary:     Effort: Pulmonary effort is normal.     Breath sounds: Normal breath sounds.  Skin:    General: Skin is warm and dry.  Neurological:     Mental Status: She is alert and oriented to person, place, and time.  Psychiatric:        Behavior: Behavior normal.      No results found for any visits  on 09/07/22.    The ASCVD Risk score (Arnett DK, et al., 2019) failed to calculate for the following reasons:   The patient has a prior MI or stroke diagnosis    Assessment & Plan:   Problem List Items Addressed This Visit       Cardiovascular and Mediastinum   Essential hypertension, benign    BP borderline today . Will monitor.        Relevant Orders   TSH   CMP14+EGFR   CBC   T3   Hemoglobin A1c     Respiratory   OSA (obstructive sleep apnea)    She was previously getting supplies through apnea but has not gotten any supplies in the last year.  Will forward new order to a new company will need to attach her sleep study.  She had an HST on September 27, 2013 which we can include.  She should be set to 10 cm of water pressure.      Relevant Medications   AMBULATORY NON FORMULARY MEDICATION     Endocrine   IFG (impaired fasting glucose) - Primary  Due tor recheck A1C       Relevant Orders   TSH   CMP14+EGFR   CBC   T3   Hemoglobin A1c     Genitourinary   Anemia in stage 3 chronic kidney disease (HCC)   Relevant Orders   TSH   CMP14+EGFR   CBC   T3   Hemoglobin A1c   Other Visit Diagnoses     Overactive thyroid gland       Relevant Medications   methimazole (TAPAZOLE) 5 MG tablet   Other Relevant Orders   TSH   CMP14+EGFR   CBC   T3   Hemoglobin A1c   Acute pain of right shoulder       Relevant Orders   Ambulatory referral to Physical Therapy   Chronic midline low back pain without sciatica       Relevant Orders   Ambulatory referral to Physical Therapy   Acute effusion of left ear          Effusion of the left ear-bilateral ear fullness-she is tried nasal steroids in the [place] but unfortunately they always seem to make her feel like she was having vertigo so she is quit using them completely.  Recommend a trial of an over-the-counter antihistamine daily for 2 weeks to see if this helps and improves her symptoms.  Right shoulder pain-she has  pain and tenderness directly over the [facility] joint but also think she might have a bicep tendinitis going on as well.  Plan to refer her for formal PT.  She is can to check with her insurance on coverage  Low back pain - will refer to PT> 1 to check with insurance on her coverage.  Return in about 1 year (around 09/07/2023) for shoulder adn back pain .    Nani Gasser, MD

## 2022-09-07 NOTE — Assessment & Plan Note (Signed)
BP borderline today. Will monitor.

## 2022-09-07 NOTE — Assessment & Plan Note (Signed)
She was previously getting supplies through apnea but has not gotten any supplies in the last year.  Will forward new order to a new company will need to attach her sleep study.  She had an HST on September 27, 2013 which we can include.  She should be set to 10 cm of water pressure.

## 2022-09-07 NOTE — Assessment & Plan Note (Signed)
Due tor recheck A1C

## 2022-09-08 DIAGNOSIS — E0590 Thyrotoxicosis, unspecified without thyrotoxic crisis or storm: Secondary | ICD-10-CM | POA: Diagnosis not present

## 2022-09-08 DIAGNOSIS — D631 Anemia in chronic kidney disease: Secondary | ICD-10-CM | POA: Diagnosis not present

## 2022-09-08 DIAGNOSIS — I10 Essential (primary) hypertension: Secondary | ICD-10-CM | POA: Diagnosis not present

## 2022-09-08 DIAGNOSIS — R7301 Impaired fasting glucose: Secondary | ICD-10-CM | POA: Diagnosis not present

## 2022-09-08 DIAGNOSIS — N1831 Chronic kidney disease, stage 3a: Secondary | ICD-10-CM | POA: Diagnosis not present

## 2022-09-08 LAB — CBC
Hematocrit: 33.6 % — ABNORMAL LOW (ref 34.0–46.6)
Hemoglobin: 10.4 g/dL — ABNORMAL LOW (ref 11.1–15.9)
MCH: 31.1 pg (ref 26.6–33.0)
MCHC: 31 g/dL — ABNORMAL LOW (ref 31.5–35.7)
MCV: 101 fL — ABNORMAL HIGH (ref 79–97)
Platelets: 280 10*3/uL (ref 150–450)
RBC: 3.34 x10E6/uL — ABNORMAL LOW (ref 3.77–5.28)
RDW: 15.5 % — ABNORMAL HIGH (ref 11.7–15.4)
WBC: 5.6 10*3/uL (ref 3.4–10.8)

## 2022-09-08 LAB — CMP14+EGFR
ALT: 12 IU/L (ref 0–32)
AST: 15 IU/L (ref 0–40)
Albumin: 3.7 g/dL — ABNORMAL LOW (ref 3.9–4.9)
Alkaline Phosphatase: 110 IU/L (ref 44–121)
BUN/Creatinine Ratio: 18 (ref 12–28)
BUN: 25 mg/dL (ref 8–27)
Bilirubin Total: 0.2 mg/dL (ref 0.0–1.2)
CO2: 24 mmol/L (ref 20–29)
Calcium: 9.3 mg/dL (ref 8.7–10.3)
Chloride: 110 mmol/L — ABNORMAL HIGH (ref 96–106)
Creatinine, Ser: 1.36 mg/dL — ABNORMAL HIGH (ref 0.57–1.00)
Globulin, Total: 2.6 g/dL (ref 1.5–4.5)
Glucose: 100 mg/dL — ABNORMAL HIGH (ref 70–99)
Potassium: 3.7 mmol/L (ref 3.5–5.2)
Sodium: 146 mmol/L — ABNORMAL HIGH (ref 134–144)
Total Protein: 6.3 g/dL (ref 6.0–8.5)
eGFR: 43 mL/min/{1.73_m2} — ABNORMAL LOW (ref >59)

## 2022-09-09 DIAGNOSIS — R112 Nausea with vomiting, unspecified: Secondary | ICD-10-CM | POA: Diagnosis not present

## 2022-09-09 DIAGNOSIS — Z881 Allergy status to other antibiotic agents status: Secondary | ICD-10-CM | POA: Diagnosis not present

## 2022-09-09 DIAGNOSIS — N183 Chronic kidney disease, stage 3 unspecified: Secondary | ICD-10-CM | POA: Diagnosis not present

## 2022-09-09 DIAGNOSIS — Z882 Allergy status to sulfonamides status: Secondary | ICD-10-CM | POA: Diagnosis not present

## 2022-09-09 DIAGNOSIS — I129 Hypertensive chronic kidney disease with stage 1 through stage 4 chronic kidney disease, or unspecified chronic kidney disease: Secondary | ICD-10-CM | POA: Diagnosis not present

## 2022-09-09 DIAGNOSIS — Z79899 Other long term (current) drug therapy: Secondary | ICD-10-CM | POA: Diagnosis not present

## 2022-09-09 DIAGNOSIS — K529 Noninfective gastroenteritis and colitis, unspecified: Secondary | ICD-10-CM | POA: Diagnosis not present

## 2022-09-10 ENCOUNTER — Other Ambulatory Visit: Payer: Self-pay | Admitting: Family Medicine

## 2022-09-10 ENCOUNTER — Telehealth: Payer: Self-pay

## 2022-09-10 NOTE — Telephone Encounter (Signed)
Patient called - was seen at [facility] ER yesterday  For diarrhea and vomiting.  She wasdx: with gastroenteritis and hypomagnesium -  given Magnsium oxide  and zofran  She was told by ER not to eat anything for 24 to 48 hours.  She is no longer vomiting but still has some diarrhea.  She states that she feels better than yesterday  She was told  by ER to call our office to see if o.k. to take the medications prescribed by our office.

## 2022-09-10 NOTE — Telephone Encounter (Signed)
OK to hold the losartan while having diarrhea. If feling a little better can split tab in half and restart gradually

## 2022-09-11 NOTE — Telephone Encounter (Signed)
Patient informed. 

## 2022-09-11 NOTE — Telephone Encounter (Signed)
The extra mag would only e temporary since she is low that can be just as bad as having to much. So we need to get levels back up and recheck in 2 weeks. If looks good we can stop it at tht time.  I am not aware of magneius affecting kidneys unless taking too much

## 2022-09-16 ENCOUNTER — Other Ambulatory Visit: Payer: Self-pay

## 2022-09-16 ENCOUNTER — Ambulatory Visit: Payer: Medicare HMO | Attending: Family Medicine | Admitting: Physical Therapy

## 2022-09-16 ENCOUNTER — Encounter: Payer: Self-pay | Admitting: Physical Therapy

## 2022-09-16 DIAGNOSIS — R2689 Other abnormalities of gait and mobility: Secondary | ICD-10-CM | POA: Diagnosis not present

## 2022-09-16 DIAGNOSIS — M5459 Other low back pain: Secondary | ICD-10-CM | POA: Insufficient documentation

## 2022-09-16 DIAGNOSIS — M545 Low back pain, unspecified: Secondary | ICD-10-CM | POA: Diagnosis not present

## 2022-09-16 DIAGNOSIS — M6281 Muscle weakness (generalized): Secondary | ICD-10-CM | POA: Insufficient documentation

## 2022-09-16 DIAGNOSIS — G8929 Other chronic pain: Secondary | ICD-10-CM | POA: Insufficient documentation

## 2022-09-16 DIAGNOSIS — M25511 Pain in right shoulder: Secondary | ICD-10-CM | POA: Insufficient documentation

## 2022-09-16 DIAGNOSIS — R6889 Other general symptoms and signs: Secondary | ICD-10-CM | POA: Diagnosis not present

## 2022-09-16 NOTE — Progress Notes (Signed)
Surena, thyroid looks good at 2.1 as well as your T3.

## 2022-09-16 NOTE — Therapy (Signed)
OUTPATIENT PHYSICAL THERAPY SHOULDER AND BACK EVALUATION   Patient Name: Sharon Reilly MRN: [id number] DOB:03/14/1955, 67 y.o., female Today's Date: 09/16/2022  END OF SESSION:  PT End of Session - 09/16/22 1010     Visit Number 1    Number of Visits 16    Date for PT Re-Evaluation 11/11/22    Authorization Type [employer]    PT Start Time 1015    PT Stop Time 1100    PT Time Calculation (min) 45 min    Activity Tolerance Patient tolerated treatment well    Behavior During Therapy WFL for tasks assessed/performed             Past Medical History:  Diagnosis Date   Fibromyalgia    Hyperlipidemia    Hypertension    Lupus (HCC)    Lupus (systemic lupus erythematosus) (HCC)    NSTEMI (non-ST elevated myocardial infarction) (HCC) 03/08/2016   [facility]   Obesity    Osteoarthritis    Osteoporosis    Past Surgical History:  Procedure Laterality Date   ABDOMINAL HYSTERECTOMY     partial   CARPAL TUNNEL RELEASE     left   CHOLECYSTECTOMY Bilateral 05/14/2016   epicondylitis     left   fistulotomy     great toe fusion     left   MRI syr ortho spec     Dr. Virgie Dad   REDUCTION MAMMAPLASTY Bilateral    right elbow     surgisis biodesign fistula plug     talonavicular joint fusion     LT foot   Patient Active Problem List   Diagnosis Date Noted   OAB (overactive bladder) 12/18/2020   Chronic pain of right thumb 12/18/2020   Umbilical hernia without obstruction and without gangrene 06/26/2019   Hallux limitus of right foot 03/02/2019   Onychomycosis of toenail 12/14/2018   Eczema 06/14/2018   CKD (chronic kidney disease) stage 3, GFR 30-59 ml/min (HCC) 04/08/2017   Secondary hyperparathyroidism of renal origin (HCC) 01/11/2017   Anemia in stage 3 chronic kidney disease (HCC) 11/18/2016   Hallux rigidus of right foot 09/29/2016   Primary osteoarthritis of both knees 09/29/2016   Gallstones 05/06/2016   Pure hypercholesterolemia 03/31/2016   CAD (coronary  artery disease) 03/09/2016   NSTEMI (non-ST elevated myocardial infarction) (HCC) 03/08/2016   Internal hemorrhoids 10/14/2015   Gastroesophageal reflux disease with esophagitis 08/14/2015   Lymphadenopathy of right cervical region 08/05/2015   Lumbago with sciatica 11/07/2014   Left thyroid nodule 02/01/2014   Nonpalpable mass of neck 02/01/2014   OSA (obstructive sleep apnea) 10/04/2013   Lactose intolerance 05/31/2013   IFG (impaired fasting glucose) 09/01/2012   Fibromyalgia 07/10/2010   Tubular adenoma of colon 07/10/2010   Anal fistula 07/10/2010   Essential hypertension, benign 06/26/2010   Morbid obesity (HCC) 06/26/2010   HYPERLIPIDEMIA 05/20/2010   OSTEOPOROSIS 05/20/2010    PCP: Agapito Games, MD  REFERRING PROVIDER: Agapito Games, MD  REFERRING DIAG: M25.511 (ICD-10-CM) - Acute pain of right shoulder M54.50,G89.29 (ICD-10-CM) - Chronic midline low back pain without sciatica  THERAPY DIAG:  Acute pain of right shoulder  Muscle weakness (generalized)  Other low back pain  Other abnormalities of gait and mobility  Rationale for Evaluation and Treatment: Rehabilitation  ONSET DATE: ~3-4 months  SUBJECTIVE:  SUBJECTIVE STATEMENT: Pt states she started to exercise and began to bother her back and shoulder. Pt reports R shoulder got so bad she couldn't lift it up. Pt states her shoulder has improved since resting from the exercises but still has pain. Pulling arm back she can feel it. Uses walker at home at her baseline. Cane use for in the community. Hand dominance: Ambidextrous  PERTINENT HISTORY: Arthritis in knees  PAIN:  Are you having pain? Yes: NPRS scale: with movement 4, 2 currently/10 Pain location: R anterior shoulder Pain description: sometimes sharp,  sometimes dull Aggravating factors: overhead movement, pulling arm back, boxing Relieving factors: Rest  Are you having pain? Yes: NPRS scale: 3 currently/10 Pain location: lower mid sacrum -- down to hips Pain description: sharp with movements Aggravating factors: Exercises -- especially lifting legs Relieving factors: Rest   PRECAUTIONS: None  RED FLAGS: None   WEIGHT BEARING RESTRICTIONS: No  FALLS:  Has patient fallen in last 6 months? No  LIVING ENVIRONMENT: Lives with: lives alone Lives in: House/apartment Stairs: No -- has elevator Has following equipment at home: Single point cane  OCCUPATION: Retired  PLOF: Independent  PATIENT GOALS: Exercise with less pain  NEXT MD VISIT:   OBJECTIVE:   DIAGNOSTIC FINDINGS:  N/a  PATIENT SURVEYS:  Modified Oswestry 38%  and Quick Dash 22.7%  COGNITION: Overall cognitive status: Within functional limits for tasks assessed     SENSATION: Reports some baseline N/T  POSTURE: Flat thoracic spine, increased lumbar lordosis, rounded shoulders  Hamstring length 60 deg bilat  LUMBAR ROM:   Active  A/PROM  eval  Flexion 100%  Extension 100%  Right lateral flexion 100%  Left lateral flexion 100%  Right rotation 100%  Left rotation 100%   (Blank rows = not tested)   UPPER EXTREMITY ROM:   Active ROM Right eval Left eval  Shoulder flexion 140* 140  Shoulder extension 60 60  Shoulder abduction 135 135  Shoulder adduction    Shoulder internal rotation L1 L1  Shoulder external rotation T1 T1  Elbow flexion    Elbow extension    Wrist flexion    Wrist extension    Wrist ulnar deviation    Wrist radial deviation    Wrist pronation    Wrist supination    (Blank rows = not tested) * = concordant pain  UPPER EXTREMITY MMT:  MMT Right eval Left eval  Shoulder flexion 4* 4  Shoulder extension 4 4  Shoulder abduction 3+* 3+*  Shoulder adduction    Shoulder internal rotation 4 4  Shoulder external  rotation 3* 3+  Middle trapezius 3+ 3+  Lower trapezius    Elbow flexion    Elbow extension    Wrist flexion    Wrist extension    Wrist ulnar deviation    Wrist radial deviation    Wrist pronation    Wrist supination    Grip strength (lbs)    (Blank rows = not tested) * = concordant pain  LOWER EXTREMITY MMT:    MMT Right eval Left eval  Hip flexion 4 4  Hip extension 3+ 3+  Hip abduction 3+ 3+  Hip adduction    Hip internal rotation    Hip external rotation    Knee flexion 4 4  Knee extension 4+ 4+  Ankle dorsiflexion    Ankle plantarflexion    Ankle inversion    Ankle eversion     (Blank rows = not tested)   SHOULDER SPECIAL  TESTS: Impingement tests: Painful arc test: negative SLAP lesions: Biceps load test: negative Instability tests: Apprehension test: positive  Rotator cuff assessment: External rotation lag sign: positive  and Internal rotation lag sign: negative Biceps assessment: Speed's test: negative  PALPATION:  Glute tenderness   TODAY'S TREATMENT:                                                                                                                                         DATE: 09/16/22 See HEP below   PATIENT EDUCATION: Education details: Exam findings, POC, initial HEP Person educated: Patient Education method: Explanation, Demonstration, and Handouts Education comprehension: verbalized understanding, returned demonstration, and needs further education  HOME EXERCISE PROGRAM: Access Code: [email or web address] URL: [email or web address] Date: 09/16/2022 Prepared by: Vernon Prey April Kirstie Peri  Exercises - Supine Bridge  - 1 x daily - 7 x weekly - 2 sets - 10 reps - Supine Posterior Pelvic Tilt  - 1 x daily - 7 x weekly - 2 sets - 10 reps - 3 sec hold - Clamshell  - 1 x daily - 7 x weekly - 2 sets - 10 reps - Supine Piriformis Stretch with Towel  - 1 x daily - 7 x weekly - 2 sets - 30 sec hold - Seated Scapular Retraction  - 1 x  daily - 7 x weekly - 2 sets - 10 reps - Shoulder External Rotation and Scapular Retraction  - 1 x daily - 7 x weekly - 2 sets - 10 reps - Shoulder External Rotation in 45 Degrees Abduction  - 1 x daily - 7 x weekly - 2 sets - 10 reps  ASSESSMENT:  CLINICAL IMPRESSION: Patient is a 66 y.o. F who was seen today for physical therapy evaluation and treatment for R shoulder and back pain. Symptoms exacerbated with pt attempt to perform exercises. Assessment significant for core, midback/scapular and hip weakness. Pt demos s/s that appear consistent to anterior shoulder pain/impingement likely due to decreased posterior shoulder/scapular strength. Lumbar testing demos more issues with hip/pelvic mobility and stability vs in lumbar spine. Pt will benefit from PT to address these deficits to be able to participate in community wellness activities.   OBJECTIVE IMPAIRMENTS: Abnormal gait, decreased activity tolerance, decreased endurance, decreased mobility, decreased ROM, decreased strength, increased fascial restrictions, increased muscle spasms, impaired UE functional use, improper body mechanics, postural dysfunction, and pain.   ACTIVITY LIMITATIONS: carrying, lifting, sitting, standing, squatting, transfers, and locomotion level  PARTICIPATION LIMITATIONS: cleaning, laundry, shopping, and community activity  PERSONAL FACTORS: Age, Fitness, Past/current experiences, and Time since onset of injury/illness/exacerbation are also affecting patient's functional outcome.   REHAB POTENTIAL: Good  CLINICAL DECISION MAKING: Evolving/moderate complexity  EVALUATION COMPLEXITY: Moderate   GOALS: Goals reviewed with patient? Yes  SHORT TERM GOALS: Target date: 10/14/2022    Pt will be ind with HEP Baseline: Goal status: INITIAL  2.  Pt will have  full pain free shoulder ROM Baseline:  Goal status: INITIAL   LONG TERM GOALS: Target date: 11/11/2022   Pt will be able to participate in community  wellness exercise with at least 50% reduction in overall pain Baseline:  Goal status: INITIAL  2.  Pt will have improved quick DASH to </=13/100 to demo MCID Baseline:  Goal status: INITIAL  3.  Pt will have improved modified Oswestry </=26% to demo MCID Baseline:  Goal status: INITIAL  4.  Pt will be able to perform 5x STS in </=15 sec to demo functional LE strength Baseline:  Goal status: INITIAL   PLAN:  PT FREQUENCY: 2x/week  PT DURATION: 8 weeks  PLANNED INTERVENTIONS: Therapeutic exercises, Therapeutic activity, Neuromuscular re-education, Balance training, Gait training, Patient/Family education, Self Care, Joint mobilization, Stair training, Aquatic Therapy, Dry Needling, Electrical stimulation, Spinal mobilization, Cryotherapy, Moist heat, Taping, Vasopneumatic device, Ionotophoresis 4mg /ml Dexamethasone, Manual therapy, and Re-evaluation  PLAN FOR NEXT SESSION: Assess response to HEP. Work on Publishing rights manager and core/hip strengthening.    Erwin Nishiyama April Ma L Akeisha Lagerquist, PT 09/16/2022, 12:58 PM

## 2022-09-17 ENCOUNTER — Other Ambulatory Visit: Payer: Self-pay | Admitting: Family Medicine

## 2022-09-17 DIAGNOSIS — I10 Essential (primary) hypertension: Secondary | ICD-10-CM

## 2022-09-21 DIAGNOSIS — H524 Presbyopia: Secondary | ICD-10-CM | POA: Diagnosis not present

## 2022-09-21 DIAGNOSIS — H5213 Myopia, bilateral: Secondary | ICD-10-CM | POA: Diagnosis not present

## 2022-09-21 DIAGNOSIS — R6889 Other general symptoms and signs: Secondary | ICD-10-CM | POA: Diagnosis not present

## 2022-09-21 DIAGNOSIS — H25813 Combined forms of age-related cataract, bilateral: Secondary | ICD-10-CM | POA: Diagnosis not present

## 2022-09-22 ENCOUNTER — Telehealth: Payer: Medicare HMO

## 2022-09-22 ENCOUNTER — Other Ambulatory Visit: Payer: Medicare HMO

## 2022-09-22 ENCOUNTER — Other Ambulatory Visit: Payer: Medicare HMO | Admitting: Pharmacist

## 2022-09-22 NOTE — Progress Notes (Signed)
09/22/2022 Name: Sharon Reilly MRN: [id number] DOB: 26-Feb-1955  Chief Complaint  Patient presents with   Medication Management   Sharon Reilly is a 67 y.o. year old female who presented for a telephone visit to check on medication management of chronic disease states  Subjective:  Care Team: Primary Care Provider: Agapito Games, MD   Medication Access/Adherence  Current Pharmacy:  [facility] - [place], [place] - [address] [place] [address] Phone: [phone] Fax: [phone]  [facility] - [place], [place] - [address] [place] [address] Phone: [phone] Fax: [phone]  [facility] [place] - [place] - [address] [place] [address] Phone: [phone] Fax: [phone]  Patient reports affordability concerns with their medications: No  Patient reports access/transportation concerns to their pharmacy: Yes -currently relying on her sister to pick medications up for her Patient reports adherence concerns with their medications:  No    Hypertension: Current medications: losartan-hydrochlorothiazide 100/25mg  daily, metoprolol xl 100mg  BID -Patient has a validated, automated, upper arm home BP cuff but does not check regularly -Current blood pressure readings readings: 133/72 at last OV 7/23 -Patient denies hypotensive s/sx including dizziness, lightheadedness.  -Patient denies hypertensive symptoms including headache, chest pain, shortness of breath  Hyperlipidemia/ASCVD Risk Reduction Current lipid lowering medications: rosuvastatin 20mg  daily Antiplatelet regimen: ASA 81mg  daily   Objective: Lab Results  Component Value Date   HGBA1C 6.0 (H) 09/08/2022   Lab Results  Component Value Date   CREATININE 1.36 (H) 09/08/2022   BUN 25 09/08/2022   NA 146 (H) 09/08/2022   K 3.7 09/08/2022   CL 110 (H) 09/08/2022    CO2 24 09/08/2022   Lab Results  Component Value Date   CHOL 158 01/01/2022   HDL 46 (L) 01/01/2022   LDLCALC 90 01/01/2022   TRIG 122 01/01/2022   CHOLHDL 3.4 01/01/2022   Medications Reviewed Today     Reviewed by Lenna Gilford, RPH (Pharmacist) on 09/22/22 at 1018  Med List Status: <None>   Medication Order Taking? Sig Documenting Provider Last Dose Status Informant  acetaminophen (TYLENOL) 650 MG CR tablet [id number] Yes Take 1 tablet (650 mg total) by mouth every 8 (eight) hours as needed for pain.  Patient taking differently: Take 1,300 mg by mouth 2 (two) times daily as needed for pain.   Monica Becton, MD Taking Active   albuterol (VENTOLIN HFA) 108 (90 Base) MCG/ACT inhaler [id number] Yes Inhale 2 puffs into the lungs every 6 (six) hours as needed for wheezing or shortness of breath. Agapito Games, MD Taking Active   Kipp Laurence MEDICATION [id number]  Medication Name: Needs mask and supplies for her CPAP machine,  set to 10 cm water pressure. Agapito Games, MD  Active   aspirin 81 MG tablet [id number] Yes Take 81 mg by mouth daily. [provider] Taking Active   fexofenadine (ALLEGRA) 180 MG tablet [id number] Yes Take 1 tablet (180 mg total) by mouth daily.  Patient taking differently: Take 180 mg by mouth as needed.   Bowen, Scot Jun, DO Taking Active   losartan-hydrochlorothiazide (HYZAAR) 100-25 MG tablet [id number] Yes Take 1 tablet by mouth daily. Agapito Games, MD Taking Active   methimazole (TAPAZOLE) 5 MG tablet [id number] Yes Does not take any on Sat or Reuel Boom, MD Taking Active   metoprolol tartrate (LOPRESSOR) 100 MG  tablet [id number] Yes TAKE ONE TABLET BY MOUTH TWICE DAILY. Agapito Games, MD Taking Active   mupirocin ointment (BACTROBAN) 2 % [id number] Yes Apply 1 Application topically 2 (two) times daily. Apply to sides of toenails after cleansing and cover with bandaid. Louann Sjogren, DPM  Taking Active   omeprazole (PRILOSEC) 40 MG capsule [id number] Yes Take 1 capsule (40 mg total) by mouth daily. Agapito Games, MD Taking Active   rosuvastatin (CRESTOR) 20 MG tablet [id number] Yes Take 1 tablet (20 mg total) by mouth daily. Agapito Games, MD Taking Active   triamcinolone cream (KENALOG) 0.5 % [id number] Yes Apply 1 Application topically 2 (two) times daily as needed. Agapito Games, MD Taking Active            Assessment/Plan:   Medication Access/Adherence -Informed patient that [facility] will deliver medications at no additional cost on top of copay if this is needed in the future.  States she used mail order services before and has issues with getting on medications.  She will contact if this is a needed service to receive medication in the future.  Hypertension: - Currently moderately  controlled - Reviewed long term cardiovascular and renal outcomes of uncontrolled blood pressure - Recommend to continue current regimen and regular follow-up at this time   Hyperlipidemia/ASCVD Risk Reduction: - Currently moderately controlled.  - Recommend follow-up lipid panel at next PCP visit; if LDL remains >70, could increase rosuvastatin to 40mg  daily  Follow Up Plan: Thurston Hole to follow-up as needed per PCP  Lenna Gilford, PharmD, DPLA

## 2022-09-23 ENCOUNTER — Ambulatory Visit: Payer: Medicare HMO | Attending: Family Medicine

## 2022-09-23 DIAGNOSIS — M6281 Muscle weakness (generalized): Secondary | ICD-10-CM | POA: Diagnosis not present

## 2022-09-23 DIAGNOSIS — R42 Dizziness and giddiness: Secondary | ICD-10-CM

## 2022-09-23 DIAGNOSIS — R2689 Other abnormalities of gait and mobility: Secondary | ICD-10-CM | POA: Diagnosis not present

## 2022-09-23 DIAGNOSIS — M25511 Pain in right shoulder: Secondary | ICD-10-CM

## 2022-09-23 DIAGNOSIS — R6889 Other general symptoms and signs: Secondary | ICD-10-CM | POA: Diagnosis not present

## 2022-09-23 DIAGNOSIS — M5459 Other low back pain: Secondary | ICD-10-CM

## 2022-09-23 NOTE — Therapy (Signed)
OUTPATIENT PHYSICAL THERAPY SHOULDER AND BACK TREATMENT   Patient Name: Sharon Reilly MRN: [id number] DOB:Jan 19, 1956, 67 y.o., female Today's Date: 09/23/2022  END OF SESSION:  PT End of Session - 09/23/22 1101     Visit Number 2    Number of Visits 16    Date for PT Re-Evaluation 11/11/22    Authorization Type [employer]    PT Start Time 1100    PT Stop Time 1145    PT Time Calculation (min) 45 min    Activity Tolerance Patient tolerated treatment well    Behavior During Therapy WFL for tasks assessed/performed             Past Medical History:  Diagnosis Date   Fibromyalgia    Hyperlipidemia    Hypertension    Lupus (HCC)    Lupus (systemic lupus erythematosus) (HCC)    NSTEMI (non-ST elevated myocardial infarction) (HCC) 03/08/2016   [facility]   Obesity    Osteoarthritis    Osteoporosis    Past Surgical History:  Procedure Laterality Date   ABDOMINAL HYSTERECTOMY     partial   CARPAL TUNNEL RELEASE     left   CHOLECYSTECTOMY Bilateral 05/14/2016   epicondylitis     left   fistulotomy     great toe fusion     left   MRI syr ortho spec     Dr. Virgie Dad   REDUCTION MAMMAPLASTY Bilateral    right elbow     surgisis biodesign fistula plug     talonavicular joint fusion     LT foot   Patient Active Problem List   Diagnosis Date Noted   OAB (overactive bladder) 12/18/2020   Chronic pain of right thumb 12/18/2020   Umbilical hernia without obstruction and without gangrene 06/26/2019   Hallux limitus of right foot 03/02/2019   Onychomycosis of toenail 12/14/2018   Eczema 06/14/2018   CKD (chronic kidney disease) stage 3, GFR 30-59 ml/min (HCC) 04/08/2017   Secondary hyperparathyroidism of renal origin (HCC) 01/11/2017   Anemia in stage 3 chronic kidney disease (HCC) 11/18/2016   Hallux rigidus of right foot 09/29/2016   Primary osteoarthritis of both knees 09/29/2016   Gallstones 05/06/2016   Pure hypercholesterolemia 03/31/2016   CAD (coronary  artery disease) 03/09/2016   NSTEMI (non-ST elevated myocardial infarction) (HCC) 03/08/2016   Internal hemorrhoids 10/14/2015   Gastroesophageal reflux disease with esophagitis 08/14/2015   Lymphadenopathy of right cervical region 08/05/2015   Lumbago with sciatica 11/07/2014   Left thyroid nodule 02/01/2014   Nonpalpable mass of neck 02/01/2014   OSA (obstructive sleep apnea) 10/04/2013   Lactose intolerance 05/31/2013   IFG (impaired fasting glucose) 09/01/2012   Fibromyalgia 07/10/2010   Tubular adenoma of colon 07/10/2010   Anal fistula 07/10/2010   Essential hypertension, benign 06/26/2010   Morbid obesity (HCC) 06/26/2010   HYPERLIPIDEMIA 05/20/2010   OSTEOPOROSIS 05/20/2010    PCP: Agapito Games, MD  REFERRING PROVIDER: Agapito Games, MD  REFERRING DIAG: M25.511 (ICD-10-CM) - Acute pain of right shoulder M54.50,G89.29 (ICD-10-CM) - Chronic midline low back pain without sciatica  THERAPY DIAG:  Acute pain of right shoulder  Muscle weakness (generalized)  Other low back pain  Other abnormalities of gait and mobility  Dizziness and giddiness  Rationale for Evaluation and Treatment: Rehabilitation  ONSET DATE: ~3-4 months  SUBJECTIVE:  SUBJECTIVE STATEMENT: Patient reports 2/10 pain at rest in R shoulder and increases to 6/10 when lifting it. Patient states the LE exercises from HEP bothered her back and had pain radiating down to knee and ankle.  Hand dominance: Ambidextrous  PERTINENT HISTORY: Arthritis in knees  PAIN:  Are you having pain? Yes: NPRS scale: with movement 4, 2 currently/10 Pain location: R anterior shoulder Pain description: sometimes sharp, sometimes dull Aggravating factors: overhead movement, pulling arm back, boxing Relieving factors:  Rest  Are you having pain? Yes: NPRS scale: 3 currently/10 Pain location: lower mid sacrum -- down to hips Pain description: sharp with movements Aggravating factors: Exercises -- especially lifting legs Relieving factors: Rest   PRECAUTIONS: None  RED FLAGS: None   WEIGHT BEARING RESTRICTIONS: No  FALLS:  Has patient fallen in last 6 months? No  LIVING ENVIRONMENT: Lives with: lives alone Lives in: House/apartment Stairs: No -- has elevator Has following equipment at home: Single point cane  OCCUPATION: Retired  PLOF: Independent  PATIENT GOALS: Exercise with less pain  NEXT MD VISIT:   OBJECTIVE:   DIAGNOSTIC FINDINGS:  N/a  PATIENT SURVEYS:  Modified Oswestry 38%  and Quick Dash 22.7%  COGNITION: Overall cognitive status: Within functional limits for tasks assessed     SENSATION: Reports some baseline N/T  POSTURE: Flat thoracic spine, increased lumbar lordosis, rounded shoulders  Hamstring length 60 deg bilat  LUMBAR ROM:   Active  A/PROM  eval  Flexion 100%  Extension 100%  Right lateral flexion 100%  Left lateral flexion 100%  Right rotation 100%  Left rotation 100%   (Blank rows = not tested)   UPPER EXTREMITY ROM:   Active ROM Right eval Left eval  Shoulder flexion 140* 140  Shoulder extension 60 60  Shoulder abduction 135 135  Shoulder adduction    Shoulder internal rotation L1 L1  Shoulder external rotation T1 T1  Elbow flexion    Elbow extension    Wrist flexion    Wrist extension    Wrist ulnar deviation    Wrist radial deviation    Wrist pronation    Wrist supination    (Blank rows = not tested) * = concordant pain  UPPER EXTREMITY MMT:  MMT Right eval Left eval  Shoulder flexion 4* 4  Shoulder extension 4 4  Shoulder abduction 3+* 3+*  Shoulder adduction    Shoulder internal rotation 4 4  Shoulder external rotation 3* 3+  Middle trapezius 3+ 3+  Lower trapezius    Elbow flexion    Elbow extension     Wrist flexion    Wrist extension    Wrist ulnar deviation    Wrist radial deviation    Wrist pronation    Wrist supination    Grip strength (lbs)    (Blank rows = not tested) * = concordant pain  LOWER EXTREMITY MMT:    MMT Right eval Left eval  Hip flexion 4 4  Hip extension 3+ 3+  Hip abduction 3+ 3+  Hip adduction    Hip internal rotation    Hip external rotation    Knee flexion 4 4  Knee extension 4+ 4+  Ankle dorsiflexion    Ankle plantarflexion    Ankle inversion    Ankle eversion     (Blank rows = not tested)   SHOULDER SPECIAL TESTS: Impingement tests: Painful arc test: negative SLAP lesions: Biceps load test: negative Instability tests: Apprehension test: positive  Rotator cuff assessment: External rotation lag  sign: positive  and Internal rotation lag sign: negative Biceps assessment: Speed's test: negative  PALPATION:  Glute tenderness   TODAY'S TREATMENT:    [facility] Adult PT Treatment:                                                DATE: 09/23/2022 Therapeutic Exercise: Supine: PPT Dynamic HS stretch --> sciatic nerve glide Piriformis stretch with strap LTR + TA activation cue  Bridges --> added ball squeeze x10  S/L clamshells 2x10 Seated: Shoulder shrugs x10 Bwkd shoulder circles x10 Standing back against noodle: Scap squeezes L arms  W arms (painful) --> A arms Shoulder ER YTB (B) Seated back against noodle: Shoulder extension YTB x10 Doorway pec stretch 60 deg abd x30" --> 60 deg elevation x 30"                                                                                                                                        DATE: 09/16/22 See HEP below   PATIENT EDUCATION: Education details: Exam findings, POC, initial HEP Person educated: Patient Education method: Explanation, Demonstration, and Handouts Education comprehension: verbalized understanding, returned demonstration, and needs further education  HOME EXERCISE  PROGRAM: Access Code: [email or web address] URL: [email or web address] Date: 09/23/2022 Prepared by: Carlynn Herald  Exercises - Supine Bridge  - 1 x daily - 7 x weekly - 2 sets - 10 reps - Supine Posterior Pelvic Tilt  - 1 x daily - 7 x weekly - 2 sets - 10 reps - 3 sec hold - Clamshell  - 1 x daily - 7 x weekly - 2 sets - 10 reps - Supine Piriformis Stretch with Towel  - 1 x daily - 7 x weekly - 2 sets - 30 sec hold - Seated Scapular Retraction  - 1 x daily - 7 x weekly - 2 sets - 10 reps - Shoulder External Rotation and Scapular Retraction  - 1 x daily - 7 x weekly - 2 sets - 10 reps - Shoulder External Rotation in 45 Degrees Abduction  - 1 x daily - 7 x weekly - 2 sets - 10 reps - Supine Lower Trunk Rotation  - 1 x daily - 7 x weekly - 3 sets - 10 reps - Doorway Pec Stretch at 60 Degrees Abduction with Arm Straight  - 1 x daily - 7 x weekly - 1 sets - 3-5 reps - 30 sec hold - Doorway Pec Stretch at 60 Elevation  - 1 x daily - 7 x weekly - 1 sets - 3-5 reps - 30 sec hold  ASSESSMENT:  CLINICAL IMPRESSION: HEP reviewed and cueing provided for proper alignment and body mechanics to address symptoms as cited in subjective intake. Postural strengthening exercises performed in standing to tolerance and  seated as needed; cueing provided to decrease wrist flexion compensation during shoulder ER and extension. Patient able to complete pec stretch in doorway with 60 degrees of abd/elevation, however unable to tolerate progression to pec stretch in 90 degree elevation.  EVAL: Patient is a 67 y.o. F who was seen today for physical therapy evaluation and treatment for R shoulder and back pain. Symptoms exacerbated with pt attempt to perform exercises. Assessment significant for core, midback/scapular and hip weakness. Pt demos s/s that appear consistent to anterior shoulder pain/impingement likely due to decreased posterior shoulder/scapular strength. Lumbar testing demos more issues with hip/pelvic  mobility and stability vs in lumbar spine. Pt will benefit from PT to address these deficits to be able to participate in community wellness activities.   OBJECTIVE IMPAIRMENTS: Abnormal gait, decreased activity tolerance, decreased endurance, decreased mobility, decreased ROM, decreased strength, increased fascial restrictions, increased muscle spasms, impaired UE functional use, improper body mechanics, postural dysfunction, and pain.   ACTIVITY LIMITATIONS: carrying, lifting, sitting, standing, squatting, transfers, and locomotion level  PARTICIPATION LIMITATIONS: cleaning, laundry, shopping, and community activity  PERSONAL FACTORS: Age, Fitness, Past/current experiences, and Time since onset of injury/illness/exacerbation are also affecting patient's functional outcome.   REHAB POTENTIAL: Good  CLINICAL DECISION MAKING: Evolving/moderate complexity  EVALUATION COMPLEXITY: Moderate   GOALS: Goals reviewed with patient? Yes  SHORT TERM GOALS: Target date: 10/14/2022   Pt will be ind with HEP Baseline: Goal status: INITIAL  2.  Pt will have full pain free shoulder ROM Baseline:  Goal status: INITIAL   LONG TERM GOALS: Target date: 11/11/2022  Pt will be able to participate in community wellness exercise with at least 50% reduction in overall pain Baseline:  Goal status: INITIAL  2.  Pt will have improved quick DASH to </=13/100 to demo MCID Baseline: 22.7% Goal status: INITIAL  3.  Pt will have improved modified Oswestry </=26% to demo MCID Baseline: 38% Goal status: INITIAL  4.  Pt will be able to perform 5x STS in </=15 sec to demo functional LE strength Baseline: 25 sec Goal status: INITIAL   PLAN:  PT FREQUENCY: 2x/week  PT DURATION: 8 weeks  PLANNED INTERVENTIONS: Therapeutic exercises, Therapeutic activity, Neuromuscular re-education, Balance training, Gait training, Patient/Family education, Self Care, Joint mobilization, Stair training, Aquatic Therapy,  Dry Needling, Electrical stimulation, Spinal mobilization, Cryotherapy, Moist heat, Taping, Vasopneumatic device, Ionotophoresis 4mg /ml Dexamethasone, Manual therapy, and Re-evaluation  PLAN FOR NEXT SESSION: Work on scapular strengthening and core/hip strengthening.    Sanjuana Mae, PTA 09/23/2022, 11:53 AM

## 2022-09-30 ENCOUNTER — Ambulatory Visit: Payer: Medicare HMO

## 2022-09-30 DIAGNOSIS — M6281 Muscle weakness (generalized): Secondary | ICD-10-CM

## 2022-09-30 DIAGNOSIS — M25511 Pain in right shoulder: Secondary | ICD-10-CM

## 2022-09-30 DIAGNOSIS — M5459 Other low back pain: Secondary | ICD-10-CM

## 2022-09-30 DIAGNOSIS — R2689 Other abnormalities of gait and mobility: Secondary | ICD-10-CM | POA: Diagnosis not present

## 2022-09-30 DIAGNOSIS — R42 Dizziness and giddiness: Secondary | ICD-10-CM

## 2022-09-30 DIAGNOSIS — R6889 Other general symptoms and signs: Secondary | ICD-10-CM | POA: Diagnosis not present

## 2022-09-30 NOTE — Therapy (Signed)
OUTPATIENT PHYSICAL THERAPY SHOULDER AND BACK TREATMENT   Patient Name: Sharon Reilly MRN: [id number] DOB:[birth year], 67 y.o., female Today's Date: 09/30/2022  END OF SESSION:  PT End of Session - 09/30/22 1304     Visit Number 3    Number of Visits 16    Date for PT Re-Evaluation 11/11/22    Authorization Type [employer]    Authorization Time Period 16 VISITS APPROVED FOR PT 09/16/2022-09/25/204    Authorization - Visit Number 3    Authorization - Number of Visits 16    PT Start Time 1310    PT Stop Time 1355    PT Time Calculation (min) 45 min    Activity Tolerance Patient tolerated treatment well    Behavior During Therapy WFL for tasks assessed/performed             Past Medical History:  Diagnosis Date   Fibromyalgia    Hyperlipidemia    Hypertension    Lupus (HCC)    Lupus (systemic lupus erythematosus) (HCC)    NSTEMI (non-ST elevated myocardial infarction) (HCC) 03/08/2016   [facility]   Obesity    Osteoarthritis    Osteoporosis    Past Surgical History:  Procedure Laterality Date   ABDOMINAL HYSTERECTOMY     partial   CARPAL TUNNEL RELEASE     left   CHOLECYSTECTOMY Bilateral 05/14/2016   epicondylitis     left   fistulotomy     great toe fusion     left   MRI syr ortho spec     Dr. Virgie Dad   REDUCTION MAMMAPLASTY Bilateral    right elbow     surgisis biodesign fistula plug     talonavicular joint fusion     LT foot   Patient Active Problem List   Diagnosis Date Noted   OAB (overactive bladder) 12/18/2020   Chronic pain of right thumb 12/18/2020   Umbilical hernia without obstruction and without gangrene 06/26/2019   Hallux limitus of right foot 03/02/2019   Onychomycosis of toenail 12/14/2018   Eczema 06/14/2018   CKD (chronic kidney disease) stage 3, GFR 30-59 ml/min (HCC) 04/08/2017   Secondary hyperparathyroidism of renal origin (HCC) 01/11/2017   Anemia in stage 3 chronic kidney disease (HCC) 11/18/2016   Hallux rigidus of right  foot 09/29/2016   Primary osteoarthritis of both knees 09/29/2016   Gallstones 05/06/2016   Pure hypercholesterolemia 03/31/2016   CAD (coronary artery disease) 03/09/2016   NSTEMI (non-ST elevated myocardial infarction) (HCC) 03/08/2016   Internal hemorrhoids 10/14/2015   Gastroesophageal reflux disease with esophagitis 08/14/2015   Lymphadenopathy of right cervical region 08/05/2015   Lumbago with sciatica 11/07/2014   Left thyroid nodule 02/01/2014   Nonpalpable mass of neck 02/01/2014   OSA (obstructive sleep apnea) 10/04/2013   Lactose intolerance 05/31/2013   IFG (impaired fasting glucose) 09/01/2012   Fibromyalgia 07/10/2010   Tubular adenoma of colon 07/10/2010   Anal fistula 07/10/2010   Essential hypertension, benign 06/26/2010   Morbid obesity (HCC) 06/26/2010   HYPERLIPIDEMIA 05/20/2010   OSTEOPOROSIS 05/20/2010    PCP: Agapito Games, MD  REFERRING PROVIDER: Agapito Games, MD  REFERRING DIAG: M25.511 (ICD-10-CM) - Acute pain of right shoulder M54.50,G89.29 (ICD-10-CM) - Chronic midline low back pain without sciatica  THERAPY DIAG:  Acute pain of right shoulder  Muscle weakness (generalized)  Other low back pain  Other abnormalities of gait and mobility  Dizziness and giddiness  Rationale for Evaluation and Treatment: Rehabilitation  ONSET DATE: ~3-4  months  SUBJECTIVE:                                                                                                                                                                                      SUBJECTIVE STATEMENT: Patient reports her R shoulder has been feeling better, however coming into her PT appointment she raised her arm and now has 2/10 pain with movement. Patient reports her back is feeling better however her knee pain is worse.  Hand dominance: Ambidextrous  PERTINENT HISTORY: Arthritis in knees  PAIN:  Are you having pain? Yes: NPRS scale: with movement 4, 2  currently/10 Pain location: R anterior shoulder Pain description: sometimes sharp, sometimes dull Aggravating factors: overhead movement, pulling arm back, boxing Relieving factors: Rest  Are you having pain? Yes: NPRS scale: 3 currently/10 Pain location: lower mid sacrum -- down to hips Pain description: sharp with movements Aggravating factors: Exercises -- especially lifting legs Relieving factors: Rest   PRECAUTIONS: None  RED FLAGS: None   WEIGHT BEARING RESTRICTIONS: No  FALLS:  Has patient fallen in last 6 months? No  LIVING ENVIRONMENT: Lives with: lives alone Lives in: House/apartment Stairs: No -- has elevator Has following equipment at home: Single point cane  OCCUPATION: Retired  PLOF: Independent  PATIENT GOALS: Exercise with less pain  NEXT MD VISIT:   OBJECTIVE:   DIAGNOSTIC FINDINGS:  N/a  PATIENT SURVEYS:  Modified Oswestry 38%  and Quick Dash 22.7%  COGNITION: Overall cognitive status: Within functional limits for tasks assessed     SENSATION: Reports some baseline N/T  POSTURE: Flat thoracic spine, increased lumbar lordosis, rounded shoulders  Hamstring length 60 deg bilat  LUMBAR ROM:   Active  A/PROM  eval  Flexion 100%  Extension 100%  Right lateral flexion 100%  Left lateral flexion 100%  Right rotation 100%  Left rotation 100%   (Blank rows = not tested)   UPPER EXTREMITY ROM:   Active ROM Right eval Left eval  Shoulder flexion 140* 140  Shoulder extension 60 60  Shoulder abduction 135 135  Shoulder adduction    Shoulder internal rotation L1 L1  Shoulder external rotation T1 T1  Elbow flexion    Elbow extension    Wrist flexion    Wrist extension    Wrist ulnar deviation    Wrist radial deviation    Wrist pronation    Wrist supination    (Blank rows = not tested) * = concordant pain  UPPER EXTREMITY MMT:  MMT Right eval Left eval  Shoulder flexion 4* 4  Shoulder extension 4 4  Shoulder abduction  3+* 3+*  Shoulder adduction    Shoulder  internal rotation 4 4  Shoulder external rotation 3* 3+  Middle trapezius 3+ 3+  Lower trapezius    Elbow flexion    Elbow extension    Wrist flexion    Wrist extension    Wrist ulnar deviation    Wrist radial deviation    Wrist pronation    Wrist supination    Grip strength (lbs)    (Blank rows = not tested) * = concordant pain  LOWER EXTREMITY MMT:    MMT Right eval Left eval  Hip flexion 4 4  Hip extension 3+ 3+  Hip abduction 3+ 3+  Hip adduction    Hip internal rotation    Hip external rotation    Knee flexion 4 4  Knee extension 4+ 4+  Ankle dorsiflexion    Ankle plantarflexion    Ankle inversion    Ankle eversion     (Blank rows = not tested)   SHOULDER SPECIAL TESTS: Impingement tests: Painful arc test: negative SLAP lesions: Biceps load test: negative Instability tests: Apprehension test: positive  Rotator cuff assessment: External rotation lag sign: positive  and Internal rotation lag sign: negative Biceps assessment: Speed's test: negative  PALPATION:  Glute tenderness   TODAY'S TREATMENT:    [facility] Adult PT Treatment:                                                DATE: 09/30/2022 Therapeutic Exercise: Supine: PPT x10 HS/ITB stretches w/strap x30" each (B) Bridges + ball squeeze x10 --> PPT bridge x10 S/L clamshells RTB 2x10 S/L reverse clamshell 2x10 Doorway pec stretch (low & mid) x30" each S/L (R) shoulder ER x10 Seated UT/LS stretch 3x10" Supine scap punches with 2# dowel x10 Supine shoulder flexion with dowel (discontinued d/t pain) Standing: Scap squeezes with noodle against wall 10x5" R AROM abd (painful), scaption Straight arm horizontal shoulder abd RTB x10 R shoulder ER in horizontal YTB Shoulder flexion stretch at high table Bent over hip extension x10 (B) Standing hip abd YTB crossed at ankles x10 (B)    [facility] Adult PT Treatment:                                                DATE:  09/23/2022 Therapeutic Exercise: Supine: PPT Dynamic HS stretch --> sciatic nerve glide Piriformis stretch with strap LTR + TA activation cue  Bridges --> added ball squeeze x10  S/L clamshells 2x10 Seated: Shoulder shrugs x10 Bwkd shoulder circles x10 Standing back against noodle: Scap squeezes L arms  W arms (painful) --> A arms Shoulder ER YTB (B) Seated back against noodle: Shoulder extension YTB x10 Doorway pec stretch 60 deg abd x30" --> 60 deg elevation x 30"  PATIENT EDUCATION: Education details: Exam findings, POC, initial HEP Person educated: Patient Education method: Explanation, Demonstration, and Handouts Education comprehension: verbalized understanding, returned demonstration, and needs further education  HOME EXERCISE PROGRAM: Access Code: [email or web address] URL: [email or web address] Date: 09/30/2022 Prepared by: Carlynn Herald  Exercises - Supine Bridge  - 1 x daily - 7 x weekly - 2 sets - 10 reps - Supine Posterior Pelvic Tilt  - 1 x daily - 7 x weekly - 2 sets - 10 reps - 3 sec hold - Clamshell  - 1 x daily - 7 x weekly - 2 sets - 10 reps - Supine Piriformis Stretch with Towel  - 1 x daily - 7 x weekly - 2 sets - 30 sec hold - Seated Scapular Retraction  - 1 x daily - 7 x weekly - 2 sets - 10 reps - Shoulder External Rotation and Scapular Retraction  - 1 x daily - 7 x weekly - 2 sets - 10 reps - Shoulder External Rotation in 45 Degrees Abduction  - 1 x daily - 7 x weekly - 2 sets - 10 reps - Supine Lower Trunk Rotation  - 1 x daily - 7 x weekly - 3 sets - 10 reps - Doorway Pec Stretch at 60 Degrees Abduction with Arm Straight  - 1 x daily - 7 x weekly - 1 sets - 3-5 reps - 30 sec hold - Doorway Pec Stretch at 60 Elevation  - 1 x daily - 7 x weekly - 1 sets - 3-5 reps - 30 sec hold - Shoulder Flexion Wall Slide with Towel  - 1 x  daily - 7 x weekly - 3 sets - 10 reps - Scaption Wall Slide with Towel  - 1 x daily - 7 x weekly - 3 sets - 10 reps - Standing 'L' Stretch at Counter  - 1 x daily - 7 x weekly - 3 sets - 10 reps - Prone Hip Extension on Table  - 1 x daily - 7 x weekly - 3 sets - 10 reps  ASSESSMENT:  CLINICAL IMPRESSION: Hip strengthening continued with focus on dynamic hip IR/ER mobility. Cervical stretches decreased  Postural strengthening progressed as tolerated by patient; increased pain with shoulder ER and abduction motions. Patient tolerated a few repetitions with very light resistance with shoulder ER in horizontal plane.   EVAL: Patient is a 67 y.o. F who was seen today for physical therapy evaluation and treatment for R shoulder and back pain. Symptoms exacerbated with pt attempt to perform exercises. Assessment significant for core, midback/scapular and hip weakness. Pt demos s/s that appear consistent to anterior shoulder pain/impingement likely due to decreased posterior shoulder/scapular strength. Lumbar testing demos more issues with hip/pelvic mobility and stability vs in lumbar spine. Pt will benefit from PT to address these deficits to be able to participate in community wellness activities.   OBJECTIVE IMPAIRMENTS: Abnormal gait, decreased activity tolerance, decreased endurance, decreased mobility, decreased ROM, decreased strength, increased fascial restrictions, increased muscle spasms, impaired UE functional use, improper body mechanics, postural dysfunction, and pain.   ACTIVITY LIMITATIONS: carrying, lifting, sitting, standing, squatting, transfers, and locomotion level  PARTICIPATION LIMITATIONS: cleaning, laundry, shopping, and community activity  PERSONAL FACTORS: Age, Fitness, Past/current experiences, and Time since onset of injury/illness/exacerbation are also affecting patient's functional outcome.   REHAB POTENTIAL: Good  CLINICAL DECISION MAKING: Evolving/moderate  complexity  EVALUATION COMPLEXITY: Moderate   GOALS: Goals reviewed with patient? Yes  SHORT TERM GOALS: Target date: 10/14/2022   Pt  will be ind with HEP Baseline: Goal status: INITIAL  2.  Pt will have full pain free shoulder ROM Baseline:  Goal status: INITIAL   LONG TERM GOALS: Target date: 11/11/2022  Pt will be able to participate in community wellness exercise with at least 50% reduction in overall pain Baseline:  Goal status: INITIAL  2.  Pt will have improved quick DASH to </=13/100 to demo MCID Baseline: 22.7% Goal status: INITIAL  3.  Pt will have improved modified Oswestry </=26% to demo MCID Baseline: 38% Goal status: INITIAL  4.  Pt will be able to perform 5x STS in </=15 sec to demo functional LE strength Baseline: 25 sec Goal status: INITIAL   PLAN:  PT FREQUENCY: 2x/week  PT DURATION: 8 weeks  PLANNED INTERVENTIONS: Therapeutic exercises, Therapeutic activity, Neuromuscular re-education, Balance training, Gait training, Patient/Family education, Self Care, Joint mobilization, Stair training, Aquatic Therapy, Dry Needling, Electrical stimulation, Spinal mobilization, Cryotherapy, Moist heat, Taping, Vasopneumatic device, Ionotophoresis 4mg /ml Dexamethasone, Manual therapy, and Re-evaluation  PLAN FOR NEXT SESSION: Work on scapular strengthening and core/hip strengthening.    Sanjuana Mae, PTA 09/30/2022, 1:58 PM

## 2022-10-07 ENCOUNTER — Ambulatory Visit: Payer: Medicare HMO

## 2022-10-07 DIAGNOSIS — M5459 Other low back pain: Secondary | ICD-10-CM

## 2022-10-07 DIAGNOSIS — R2689 Other abnormalities of gait and mobility: Secondary | ICD-10-CM

## 2022-10-07 DIAGNOSIS — R42 Dizziness and giddiness: Secondary | ICD-10-CM

## 2022-10-07 DIAGNOSIS — M25511 Pain in right shoulder: Secondary | ICD-10-CM

## 2022-10-07 DIAGNOSIS — M6281 Muscle weakness (generalized): Secondary | ICD-10-CM

## 2022-10-07 DIAGNOSIS — R6889 Other general symptoms and signs: Secondary | ICD-10-CM | POA: Diagnosis not present

## 2022-10-07 NOTE — Therapy (Addendum)
OUTPATIENT PHYSICAL THERAPY SHOULDER AND BACK TREATMENT   Patient Name: Sharon Reilly MRN: [id number] DOB:06/07/1955, 67 y.o., female Today's Date: 10/07/2022   PHYSICAL THERAPY DISCHARGE SUMMARY  Visits from Start of Care: 4  Current functional level related to goals / functional outcomes: Not met-- patient is ending plan of care due to transportation limitations   Remaining deficits: See note below for last known status   Education / Equipment: Comprehensive HEP reviewed for post d/c exercise   Patient agrees to discharge. Patient goals were not met. Patient is being discharged due to  transportation issues.  END OF SESSION:  PT End of Session - 10/07/22 1309     Visit Number 4    Number of Visits 16    Date for PT Re-Evaluation 11/11/22    Authorization Type [employer]    Authorization Time Period 16 VISITS APPROVED FOR PT 09/16/2022-09/25/204    Authorization - Visit Number 4    Authorization - Number of Visits 16    PT Start Time 1315    PT Stop Time 1400    PT Time Calculation (min) 45 min    Activity Tolerance Patient tolerated treatment well    Behavior During Therapy WFL for tasks assessed/performed             Past Medical History:  Diagnosis Date   Fibromyalgia    Hyperlipidemia    Hypertension    Lupus (HCC)    Lupus (systemic lupus erythematosus) (HCC)    NSTEMI (non-ST elevated myocardial infarction) (HCC) 03/08/2016   [facility]   Obesity    Osteoarthritis    Osteoporosis    Past Surgical History:  Procedure Laterality Date   ABDOMINAL HYSTERECTOMY     partial   CARPAL TUNNEL RELEASE     left   CHOLECYSTECTOMY Bilateral 05/14/2016   epicondylitis     left   fistulotomy     great toe fusion     left   MRI syr ortho spec     Dr. Virgie Dad   REDUCTION MAMMAPLASTY Bilateral    right elbow     surgisis biodesign fistula plug     talonavicular joint fusion     LT foot   Patient Active Problem List   Diagnosis Date Noted   OAB  (overactive bladder) 12/18/2020   Chronic pain of right thumb 12/18/2020   Umbilical hernia without obstruction and without gangrene 06/26/2019   Hallux limitus of right foot 03/02/2019   Onychomycosis of toenail 12/14/2018   Eczema 06/14/2018   CKD (chronic kidney disease) stage 3, GFR 30-59 ml/min (HCC) 04/08/2017   Secondary hyperparathyroidism of renal origin (HCC) 01/11/2017   Anemia in stage 3 chronic kidney disease (HCC) 11/18/2016   Hallux rigidus of right foot 09/29/2016   Primary osteoarthritis of both knees 09/29/2016   Gallstones 05/06/2016   Pure hypercholesterolemia 03/31/2016   CAD (coronary artery disease) 03/09/2016   NSTEMI (non-ST elevated myocardial infarction) (HCC) 03/08/2016   Internal hemorrhoids 10/14/2015   Gastroesophageal reflux disease with esophagitis 08/14/2015   Lymphadenopathy of right cervical region 08/05/2015   Lumbago with sciatica 11/07/2014   Left thyroid nodule 02/01/2014   Nonpalpable mass of neck 02/01/2014   OSA (obstructive sleep apnea) 10/04/2013   Lactose intolerance 05/31/2013   IFG (impaired fasting glucose) 09/01/2012   Fibromyalgia 07/10/2010   Tubular adenoma of colon 07/10/2010   Anal fistula 07/10/2010   Essential hypertension, benign 06/26/2010   Morbid obesity (HCC) 06/26/2010   HYPERLIPIDEMIA 05/20/2010  OSTEOPOROSIS 05/20/2010    PCP: Agapito Games, MD  REFERRING PROVIDER: Agapito Games, MD  REFERRING DIAG: [phone] (ICD-10-CM) - Acute pain of right shoulder M54.50,G89.29 (ICD-10-CM) - Chronic midline low back pain without sciatica  THERAPY DIAG:  Acute pain of right shoulder  Muscle weakness (generalized)  Other low back pain  Other abnormalities of gait and mobility  Dizziness and giddiness  Rationale for Evaluation and Treatment: Rehabilitation  ONSET DATE: ~3-4 months  SUBJECTIVE:                                                                                                                                                                                       SUBJECTIVE STATEMENT: Patient reports her R shoulder if feeling better but continues to have "achy" pain in shoulder with certain arm movements. Patient reports she did a lot of walking yesterday and her back was hurting but she is feeling better today. Patient states she has used all her transportation visits and will no longer be able to attend to PT due to not driving.  Hand dominance: Ambidextrous  PERTINENT HISTORY: Arthritis in knees  PAIN:  Are you having pain? Yes: NPRS scale: with movement 4, 2 currently/10 Pain location: R anterior shoulder Pain description: sometimes sharp, sometimes dull Aggravating factors: overhead movement, pulling arm back, boxing Relieving factors: Rest  Are you having pain? Yes: NPRS scale: 3 currently/10 Pain location: lower mid sacrum -- down to hips Pain description: sharp with movements Aggravating factors: Exercises -- especially lifting legs Relieving factors: Rest   PRECAUTIONS: None  RED FLAGS: None   WEIGHT BEARING RESTRICTIONS: No  FALLS:  Has patient fallen in last 6 months? No  LIVING ENVIRONMENT: Lives with: lives alone Lives in: House/apartment Stairs: No -- has elevator Has following equipment at home: Single point cane  OCCUPATION: Retired  PLOF: Independent  PATIENT GOALS: Exercise with less pain  NEXT MD VISIT:   OBJECTIVE:   DIAGNOSTIC FINDINGS:  N/a  PATIENT SURVEYS:  Modified Oswestry 38%  and Quick Dash 22.7%  COGNITION: Overall cognitive status: Within functional limits for tasks assessed     SENSATION: Reports some baseline N/T  POSTURE: Flat thoracic spine, increased lumbar lordosis, rounded shoulders  Hamstring length 60 deg bilat  LUMBAR ROM:   Active  A/PROM  eval  Flexion 100%  Extension 100%  Right lateral flexion 100%  Left lateral flexion 100%  Right rotation 100%  Left rotation 100%   (Blank rows = not  tested)   UPPER EXTREMITY ROM:   Active ROM Right eval Left eval  Shoulder flexion 140* 140  Shoulder extension 60 60  Shoulder abduction 135  135  Shoulder adduction    Shoulder internal rotation L1 L1  Shoulder external rotation T1 T1  Elbow flexion    Elbow extension    Wrist flexion    Wrist extension    Wrist ulnar deviation    Wrist radial deviation    Wrist pronation    Wrist supination    (Blank rows = not tested) * = concordant pain  UPPER EXTREMITY MMT:  MMT Right eval Left eval Right 10/07/22 Left 10/07/22  Shoulder flexion 4* 4 4- 4  Shoulder extension 4 4 4 4   Shoulder abduction 3+* 3+* 3+* 3+*  Shoulder adduction      Shoulder internal rotation 4 4 4 4   Shoulder external rotation 3* 3+ 3* 4-  Middle trapezius 3+ 3+ 3+ 3+  Lower trapezius      Elbow flexion      Elbow extension      Wrist flexion      Wrist extension      Wrist ulnar deviation      Wrist radial deviation      Wrist pronation      Wrist supination      Grip strength (lbs)      (Blank rows = not tested) * = concordant pain  LOWER EXTREMITY MMT:    MMT Right eval Left eval Right 10/07/22 Left 10/07/22  Hip flexion 4 4 4+ 4+  Hip extension 3+ 3+ 4- 4-  Hip abduction 3+ 3+ 4- 4-  Hip adduction      Hip internal rotation      Hip external rotation      Knee flexion 4 4 4+ 4+  Knee extension 4+ 4+ 4+ 4+  Ankle dorsiflexion      Ankle plantarflexion      Ankle inversion      Ankle eversion       (Blank rows = not tested)   SHOULDER SPECIAL TESTS: Impingement tests: Painful arc test: negative SLAP lesions: Biceps load test: negative Instability tests: Apprehension test: positive  Rotator cuff assessment: External rotation lag sign: positive  and Internal rotation lag sign: negative Biceps assessment: Speed's test: negative  PALPATION:  Glute tenderness   TODAY'S TREATMENT:    [facility] Adult PT Treatment:                                                DATE:  10/07/2022 Therapeutic Exercise: NuStep (L UE) L5 x Seated with noodle at back: Scap squeezes 10x5" L arms x10 W arms x10 Doorway pec stretch 60 deg abd 3x30"  Supine bridges with ball b/w knees x10 --> typewriter bridge x10 Hip/knee & shoulder MMT STS x5 (times) Oswestry & Quick DASH intake   [facility] Adult PT Treatment:                                                DATE: 09/30/2022 Therapeutic Exercise: Supine: PPT x10 HS/ITB stretches w/strap x30" each (B) Bridges + ball squeeze x10 --> PPT bridge x10 S/L clamshells RTB 2x10 S/L reverse clamshell 2x10 Doorway pec stretch (low & mid) x30" each S/L (R) shoulder ER x10 Seated UT/LS stretch 3x10" Supine scap punches with 2# dowel x10 Supine shoulder flexion with  dowel (discontinued d/t pain) Standing: Scap squeezes with noodle against wall 10x5" R AROM abd (painful), scaption Straight arm horizontal shoulder abd RTB x10 R shoulder ER in horizontal YTB Shoulder flexion stretch at high table Bent over hip extension x10 (B) Standing hip abd YTB crossed at ankles x10 (B)    [facility] Adult PT Treatment:                                                DATE: 09/23/2022 Therapeutic Exercise: Supine: PPT Dynamic HS stretch --> sciatic nerve glide Piriformis stretch with strap LTR + TA activation cue  Bridges --> added ball squeeze x10  S/L clamshells 2x10 Seated: Shoulder shrugs x10 Bwkd shoulder circles x10 Standing back against noodle: Scap squeezes L arms  W arms (painful) --> A arms Shoulder ER YTB (B) Seated back against noodle: Shoulder extension YTB x10 Doorway pec stretch 60 deg abd x30" --> 60 deg elevation x 30"                                                                                                                             PATIENT EDUCATION: Education details: Exam findings, POC, initial HEP Person educated: Patient Education method: Explanation, Demonstration, and Handouts Education comprehension:  verbalized understanding, returned demonstration, and needs further education  HOME EXERCISE PROGRAM: Access Code: [email or web address] URL: [email or web address] Date: 09/30/2022 Prepared by: Carlynn Herald  Exercises - Supine Bridge  - 1 x daily - 7 x weekly - 2 sets - 10 reps - Supine Posterior Pelvic Tilt  - 1 x daily - 7 x weekly - 2 sets - 10 reps - 3 sec hold - Clamshell  - 1 x daily - 7 x weekly - 2 sets - 10 reps - Supine Piriformis Stretch with Towel  - 1 x daily - 7 x weekly - 2 sets - 30 sec hold - Seated Scapular Retraction  - 1 x daily - 7 x weekly - 2 sets - 10 reps - Shoulder External Rotation and Scapular Retraction  - 1 x daily - 7 x weekly - 2 sets - 10 reps - Shoulder External Rotation in 45 Degrees Abduction  - 1 x daily - 7 x weekly - 2 sets - 10 reps - Supine Lower Trunk Rotation  - 1 x daily - 7 x weekly - 3 sets - 10 reps - Doorway Pec Stretch at 60 Degrees Abduction with Arm Straight  - 1 x daily - 7 x weekly - 1 sets - 3-5 reps - 30 sec hold - Doorway Pec Stretch at 60 Elevation  - 1 x daily - 7 x weekly - 1 sets - 3-5 reps - 30 sec hold - Shoulder Flexion Wall Slide with Towel  - 1 x daily - 7 x weekly - 3 sets - 10 reps -  Scaption Wall Slide with Towel  - 1 x daily - 7 x weekly - 3 sets - 10 reps - Standing 'L' Stretch at Counter  - 1 x daily - 7 x weekly - 3 sets - 10 reps - Prone Hip Extension on Table  - 1 x daily - 7 x weekly - 3 sets - 10 reps  ASSESSMENT:  CLINICAL IMPRESSION:  Bilateral hip and knee strength improved since eval; no significant change in shoulder strength as measured by MMT. No significant change in goals as outline on POC; limited progression made due to short time with patient and PT cut short due to transportation issues.   OBJECTIVE IMPAIRMENTS: Abnormal gait, decreased activity tolerance, decreased endurance, decreased mobility, decreased ROM, decreased strength, increased fascial restrictions, increased muscle spasms, impaired  UE functional use, improper body mechanics, postural dysfunction, and pain.   ACTIVITY LIMITATIONS: carrying, lifting, sitting, standing, squatting, transfers, and locomotion level  PARTICIPATION LIMITATIONS: cleaning, laundry, shopping, and community activity  PERSONAL FACTORS: Age, Fitness, Past/current experiences, and Time since onset of injury/illness/exacerbation are also affecting patient's functional outcome.   REHAB POTENTIAL: Good  CLINICAL DECISION MAKING: Evolving/moderate complexity  EVALUATION COMPLEXITY: Moderate   GOALS: Goals reviewed with patient? Yes  SHORT TERM GOALS: Target date: 10/14/2022   Pt will be ind with HEP Baseline: Goal status: MET  2.  Pt will have full pain free shoulder ROM Baseline:  Goal status: IN PROGRESS   LONG TERM GOALS: Target date: 11/11/2022  Pt will be able to participate in community wellness exercise with at least 50% reduction in overall pain Baseline:  Goal status: IN PROGRESS  2.  Pt will have improved quick DASH to </=13/100 to demo MCID Baseline: 45.5% Goal status: IN PROGRESS  3.  Pt will have improved modified Oswestry </=26% to demo MCID Baseline: 46% Goal status: IN PROGRESS  4.  Pt will be able to perform 5x STS in </=15 sec to demo functional LE strength Baseline: 30 sec Goal status: IN PROGRESS   PLAN:  PT FREQUENCY: 2x/week  PT DURATION: 8 weeks  PLANNED INTERVENTIONS: Therapeutic exercises, Therapeutic activity, Neuromuscular re-education, Balance training, Gait training, Patient/Family education, Self Care, Joint mobilization, Stair training, Aquatic Therapy, Dry Needling, Electrical stimulation, Spinal mobilization, Cryotherapy, Moist heat, Taping, Vasopneumatic device, Ionotophoresis 4mg /ml Dexamethasone, Manual therapy, and Re-evaluation  PLAN FOR NEXT SESSION: Discharge   Sanjuana Mae, PTA 10/07/2022, 2:01 PM

## 2022-11-24 DIAGNOSIS — R6889 Other general symptoms and signs: Secondary | ICD-10-CM | POA: Diagnosis not present

## 2022-11-27 ENCOUNTER — Ambulatory Visit: Payer: Medicare HMO | Admitting: Podiatry

## 2022-12-02 ENCOUNTER — Other Ambulatory Visit: Payer: Self-pay | Admitting: Family Medicine

## 2022-12-02 DIAGNOSIS — Z1231 Encounter for screening mammogram for malignant neoplasm of breast: Secondary | ICD-10-CM

## 2022-12-06 ENCOUNTER — Other Ambulatory Visit: Payer: Self-pay | Admitting: Family Medicine

## 2022-12-08 DIAGNOSIS — E0590 Thyrotoxicosis, unspecified without thyrotoxic crisis or storm: Secondary | ICD-10-CM | POA: Diagnosis not present

## 2022-12-08 DIAGNOSIS — Z1330 Encounter for screening examination for mental health and behavioral disorders, unspecified: Secondary | ICD-10-CM | POA: Diagnosis not present

## 2022-12-08 DIAGNOSIS — R6889 Other general symptoms and signs: Secondary | ICD-10-CM | POA: Diagnosis not present

## 2022-12-21 ENCOUNTER — Other Ambulatory Visit: Payer: Self-pay | Admitting: Family Medicine

## 2022-12-21 DIAGNOSIS — I10 Essential (primary) hypertension: Secondary | ICD-10-CM

## 2022-12-25 ENCOUNTER — Telehealth: Payer: Self-pay | Admitting: Family Medicine

## 2022-12-25 DIAGNOSIS — I10 Essential (primary) hypertension: Secondary | ICD-10-CM

## 2022-12-25 MED ORDER — METOPROLOL TARTRATE 100 MG PO TABS
ORAL_TABLET | ORAL | 1 refills | Status: DC
Start: 2022-12-25 — End: 2023-02-19

## 2022-12-25 NOTE — Telephone Encounter (Signed)
Meds ordered this encounter  Medications   metoprolol tartrate (LOPRESSOR) 100 MG tablet    Sig: TAKE ONE TABLET BY MOUTH TWICE DAILY.    Dispense:  90 tablet    Refill:  1

## 2022-12-25 NOTE — Telephone Encounter (Signed)
Patient has appointment scheduled for November 14th she is out of her metoprolol tartrate 100mg   Can you send over enough until her appointment Pharmacy [facility] [phone]

## 2022-12-31 ENCOUNTER — Encounter: Payer: Self-pay | Admitting: Family Medicine

## 2022-12-31 ENCOUNTER — Ambulatory Visit (INDEPENDENT_AMBULATORY_CARE_PROVIDER_SITE_OTHER): Payer: Medicare HMO | Admitting: Family Medicine

## 2022-12-31 VITALS — BP 123/73 | HR 61 | Ht 61.0 in | Wt 252.0 lb

## 2022-12-31 DIAGNOSIS — E7800 Pure hypercholesterolemia, unspecified: Secondary | ICD-10-CM

## 2022-12-31 DIAGNOSIS — G4733 Obstructive sleep apnea (adult) (pediatric): Secondary | ICD-10-CM | POA: Diagnosis not present

## 2022-12-31 DIAGNOSIS — R252 Cramp and spasm: Secondary | ICD-10-CM

## 2022-12-31 DIAGNOSIS — Z23 Encounter for immunization: Secondary | ICD-10-CM | POA: Diagnosis not present

## 2022-12-31 DIAGNOSIS — I10 Essential (primary) hypertension: Secondary | ICD-10-CM | POA: Diagnosis not present

## 2022-12-31 DIAGNOSIS — N1831 Chronic kidney disease, stage 3a: Secondary | ICD-10-CM | POA: Diagnosis not present

## 2022-12-31 DIAGNOSIS — E876 Hypokalemia: Secondary | ICD-10-CM

## 2022-12-31 MED ORDER — ROSUVASTATIN CALCIUM 20 MG PO TABS
20.0000 mg | ORAL_TABLET | Freq: Every day | ORAL | 3 refills | Status: DC
Start: 2022-12-31 — End: 2023-12-16

## 2022-12-31 NOTE — Assessment & Plan Note (Signed)
Well controlled. Continue current regimen. Follow up in 4-6 months.  

## 2022-12-31 NOTE — Progress Notes (Signed)
Established Patient Office Visit  Subjective   Patient ID: Sharon Reilly, female    DOB: 01/20/1956  Age: 67 y.o. MRN: [id number]  Chief Complaint  Patient presents with   Hypertension    HPI  Hypertension- Pt denies chest pain, SOB, dizziness, or heart palpitations.  Taking meds as directed w/o problems.  Denies medication side effects.    C/o of cramping in feet and calves.  She says has been bothering her more recently.  Worse when she is walking more.  She did have low potassium and magnesium on last set of labs.  F/U OSA -wearing her CPAP very regularly but has noticed a staining of the plastic seal in the water tank she says she cleans it very regularly and wanted to know if we knew how to get it apart so she could clean it more thoroughly.  She has had the machine for maybe 2 years or more.     ROS    Objective:     BP 123/73   Pulse 61   Ht 5\' 1"  (1.549 m)   Wt 252 lb (114.3 kg)   SpO2 99%   BMI 47.61 kg/m    Physical Exam Vitals and nursing note reviewed.  Constitutional:      Appearance: Normal appearance.  HENT:     Head: Normocephalic and atraumatic.  Eyes:     Conjunctiva/sclera: Conjunctivae normal.  Cardiovascular:     Rate and Rhythm: Normal rate and regular rhythm.  Pulmonary:     Effort: Pulmonary effort is normal.     Breath sounds: Normal breath sounds.  Skin:    General: Skin is warm and dry.  Neurological:     Mental Status: She is alert.  Psychiatric:        Mood and Affect: Mood normal.      No results found for any visits on 12/31/22.    The ASCVD Risk score (Arnett DK, et al., 2019) failed to calculate for the following reasons:   The patient has a prior MI or stroke diagnosis    Assessment & Plan:   Problem List Items Addressed This Visit       Cardiovascular and Mediastinum   Essential hypertension, benign - Primary    Well controlled. Continue current regimen. Follow up in  4-6 months.       Relevant  Medications   rosuvastatin (CRESTOR) 20 MG tablet   Other Relevant Orders   CMP14+EGFR   Magnesium   Urine Microalbumin w/creat. ratio     Respiratory   OSA (obstructive sleep apnea)    Using her CPAP regularly.          Genitourinary   CKD (chronic kidney disease) stage 3, GFR 30-59 ml/min (HCC)   Relevant Orders   CMP14+EGFR   Magnesium   Urine Microalbumin w/creat. ratio     Other   Pure hypercholesterolemia   Relevant Medications   rosuvastatin (CRESTOR) 20 MG tablet   Other Visit Diagnoses     Hypokalemia       Relevant Orders   CMP14+EGFR   Magnesium   Urine Microalbumin w/creat. ratio   Hypomagnesemia       Relevant Orders   CMP14+EGFR   Magnesium   Urine Microalbumin w/creat. ratio   Encounter for immunization       Relevant Orders   Flu Vaccine Trivalent High Dose (Fluad) (Completed)   Muscle cramping       Relevant Orders   CMP14+EGFR  Magnesium   Urine Microalbumin w/creat. ratio       Muscle cramping-we did discuss different causes including exercise, activity levels, shoe wear, hydration etc.  Also plan to recheck potassium and magnesium levels.  She gets a lot of pain in her feet and was told a few years ago to wear new balance shoes but still has a lot of discomfort.  We discussed maybe trying a different shoe like Brooks or even [employer]idas.  Everybody has different feet and how they walk and pronate supinate etc.  Encouraged her to maybe even check out [employer] or the good feet store where they have a machine that can take a look at where she puts pressure on her feet and maybe help direct her to a specific shoe that would provide a lot of support and comfort.  Return in about 4 months (around 04/30/2023) for Hypertension, Pre-diabetes.    Nani Gasser, MD

## 2022-12-31 NOTE — Assessment & Plan Note (Signed)
Using her CPAP regularly.

## 2023-01-01 LAB — CMP14+EGFR
ALT: 10 [IU]/L (ref 0–32)
AST: 17 [IU]/L (ref 0–40)
Albumin: 3.9 g/dL (ref 3.9–4.9)
Alkaline Phosphatase: 100 [IU]/L (ref 44–121)
BUN/Creatinine Ratio: 15 (ref 12–28)
BUN: 18 mg/dL (ref 8–27)
Bilirubin Total: 0.2 mg/dL (ref 0.0–1.2)
CO2: 21 mmol/L (ref 20–29)
Calcium: 9.5 mg/dL (ref 8.7–10.3)
Chloride: 108 mmol/L — ABNORMAL HIGH (ref 96–106)
Creatinine, Ser: 1.18 mg/dL — ABNORMAL HIGH (ref 0.57–1.00)
Globulin, Total: 2.6 g/dL (ref 1.5–4.5)
Glucose: 112 mg/dL — ABNORMAL HIGH (ref 70–99)
Potassium: 3.7 mmol/L (ref 3.5–5.2)
Sodium: 144 mmol/L (ref 134–144)
Total Protein: 6.5 g/dL (ref 6.0–8.5)
eGFR: 51 mL/min/{1.73_m2} — ABNORMAL LOW (ref >59)

## 2023-01-01 LAB — MICROALBUMIN / CREATININE URINE RATIO
Creatinine, Urine: 97.3 mg/dL
Microalb/Creat Ratio: <3 mg/g{creat} (ref 0–29)
Microalbumin, Urine: <3 ug/mL

## 2023-01-01 LAB — MAGNESIUM: Magnesium: 1.6 mg/dL (ref 1.6–2.3)

## 2023-01-01 NOTE — Progress Notes (Signed)
Sharon Reilly, your kidney function looks better it is more similar to your baseline I had jumped up back in July but it does look like it is back to normal which is great.  Liver function also looks great.  Magnesium level is borderline low.  Encourage you to consider taking an over-the-counter magnesium supplement maybe a few times a week just to help supplement that.

## 2023-01-13 ENCOUNTER — Ambulatory Visit: Payer: Medicare HMO

## 2023-01-13 DIAGNOSIS — Z1231 Encounter for screening mammogram for malignant neoplasm of breast: Secondary | ICD-10-CM

## 2023-01-13 DIAGNOSIS — R6889 Other general symptoms and signs: Secondary | ICD-10-CM | POA: Diagnosis not present

## 2023-01-18 NOTE — Progress Notes (Signed)
Please call patient. Normal mammogram.  Repeat in 1 year.  

## 2023-01-29 ENCOUNTER — Telehealth: Payer: Self-pay

## 2023-01-29 DIAGNOSIS — L600 Ingrowing nail: Secondary | ICD-10-CM

## 2023-01-29 NOTE — Telephone Encounter (Signed)
Copied from CRM [id number]. Topic: Referral - Request for Referral >> Jan 28, 2023 11:26 AM Adelina Mings wrote: Reason for CRM: pt is requesting a referral to be sent to foot doctor

## 2023-01-29 NOTE — Telephone Encounter (Signed)
Hi Tosha, I went ahead and put an official referral in but I do not have a way to backdate it or change the date [facility] some not sure if they will excepted or not  Orders Placed This Encounter  Procedures   Ambulatory referral to Podiatry    Referral Priority:   Routine    Referral Type:   Consultation    Referral Reason:   Specialty Services Required    Requested Specialty:   Podiatry    Number of Visits Requested:   1

## 2023-01-29 NOTE — Telephone Encounter (Signed)
Spoke with patient -  She states she needs a referral sent due to insurance not covering visit with ( podiatrist)Dr. Margaretmary Eddy on 04/24/2022 for ingrown toenail treatment . She states she received a letter stating that if received a referral for this they could pay the bill.  She now has new insurance - this was billed with previous insurance.

## 2023-01-29 NOTE — Telephone Encounter (Signed)
Patient has an appointment scheduled for 02/04/23 at 3:45pm with Dr. Jerry [facility].

## 2023-01-29 NOTE — Telephone Encounter (Signed)
Ok for referral to [facility]. Will need to clarify reason she is wanting referral

## 2023-01-29 NOTE — Telephone Encounter (Signed)
Can you please enter referral for podiatry. Patient has a [employer] and [place] Medicaid as secondary coverage. She should not have any type of bill after both insurance companies pay for her office visits. There was a previous referral for podiatry entered for 12/2021. Please advise.

## 2023-01-29 NOTE — Telephone Encounter (Signed)
Contacted [facility], Prior Auth is not required for Podiatry office visit. Reference #[id number].

## 2023-01-31 DIAGNOSIS — R0789 Other chest pain: Secondary | ICD-10-CM | POA: Diagnosis not present

## 2023-01-31 DIAGNOSIS — R079 Chest pain, unspecified: Secondary | ICD-10-CM | POA: Diagnosis not present

## 2023-01-31 DIAGNOSIS — E785 Hyperlipidemia, unspecified: Secondary | ICD-10-CM | POA: Diagnosis not present

## 2023-01-31 DIAGNOSIS — R9431 Abnormal electrocardiogram [ECG] [EKG]: Secondary | ICD-10-CM | POA: Diagnosis not present

## 2023-01-31 DIAGNOSIS — R059 Cough, unspecified: Secondary | ICD-10-CM | POA: Diagnosis not present

## 2023-01-31 DIAGNOSIS — D631 Anemia in chronic kidney disease: Secondary | ICD-10-CM | POA: Diagnosis not present

## 2023-01-31 DIAGNOSIS — Z6841 Body Mass Index (BMI) 40.0 and over, adult: Secondary | ICD-10-CM | POA: Diagnosis not present

## 2023-01-31 DIAGNOSIS — I129 Hypertensive chronic kidney disease with stage 1 through stage 4 chronic kidney disease, or unspecified chronic kidney disease: Secondary | ICD-10-CM | POA: Diagnosis not present

## 2023-01-31 DIAGNOSIS — J986 Disorders of diaphragm: Secondary | ICD-10-CM | POA: Diagnosis not present

## 2023-01-31 DIAGNOSIS — R0602 Shortness of breath: Secondary | ICD-10-CM | POA: Diagnosis not present

## 2023-01-31 DIAGNOSIS — N183 Chronic kidney disease, stage 3 unspecified: Secondary | ICD-10-CM | POA: Diagnosis not present

## 2023-01-31 DIAGNOSIS — K219 Gastro-esophageal reflux disease without esophagitis: Secondary | ICD-10-CM | POA: Diagnosis not present

## 2023-01-31 DIAGNOSIS — I2510 Atherosclerotic heart disease of native coronary artery without angina pectoris: Secondary | ICD-10-CM | POA: Diagnosis not present

## 2023-02-02 NOTE — Telephone Encounter (Signed)
Referral has been placed but we are unable to backdate it. [employer] does not require a referral for Podiatry office visits. I checked with patients insurance. She can contact [facility] to see if there is something that can be done on their end. Not sure cause this claim has been paid. Even so, its close or maybe past timely filing.

## 2023-02-03 ENCOUNTER — Telehealth: Payer: Self-pay | Admitting: Podiatry

## 2023-02-03 NOTE — Telephone Encounter (Signed)
Called pt after scheduled referral appt was cancelled due to insurance being out of network. I called pt to inform her that we are not out of network with [facility]. Pt stated that she called her insurance company about half an hour ago and was told that Dr. Ralene Cork is not in network with [employer]. Pt stated that they told her she did not need a referral and the reason her appointment in March was not covered by them is because Dr. Ralene Cork (and/or our practice) is out of network. I told her we are in network with [employer] just like her PCP is that is also a provider in the [facility] system. I offered pt to send a message to our insurance department and pt stated she would appreciate that.

## 2023-02-03 NOTE — Telephone Encounter (Signed)
Patient informed. 

## 2023-02-04 ENCOUNTER — Ambulatory Visit: Payer: Medicare HMO | Admitting: Podiatry

## 2023-02-04 NOTE — Telephone Encounter (Signed)
Thank you Sharon Reilly for letting me know. I will contact the patient and get her rescheduled for an appointment.

## 2023-02-12 ENCOUNTER — Other Ambulatory Visit: Payer: Self-pay | Admitting: Family Medicine

## 2023-02-12 DIAGNOSIS — I10 Essential (primary) hypertension: Secondary | ICD-10-CM

## 2023-03-04 ENCOUNTER — Ambulatory Visit: Payer: Medicare HMO | Admitting: Podiatry

## 2023-03-05 ENCOUNTER — Telehealth: Payer: Self-pay

## 2023-03-05 NOTE — Telephone Encounter (Signed)
Copied from CRM [id number]. Topic: General - Other >> Mar 05, 2023  1:39 PM Twila L wrote: Reason for CRM: patient need another CPAP machine . Her current one is broken

## 2023-03-11 ENCOUNTER — Other Ambulatory Visit: Payer: Self-pay | Admitting: Family Medicine

## 2023-03-22 ENCOUNTER — Ambulatory Visit (INDEPENDENT_AMBULATORY_CARE_PROVIDER_SITE_OTHER): Payer: 59

## 2023-03-22 ENCOUNTER — Encounter: Payer: Self-pay | Admitting: Family Medicine

## 2023-03-22 ENCOUNTER — Ambulatory Visit (INDEPENDENT_AMBULATORY_CARE_PROVIDER_SITE_OTHER): Payer: 59 | Admitting: Family Medicine

## 2023-03-22 VITALS — BP 132/70 | HR 58 | Ht 61.0 in | Wt 249.0 lb

## 2023-03-22 VITALS — BP 132/70 | Ht 61.0 in | Wt 249.0 lb

## 2023-03-22 DIAGNOSIS — Z780 Asymptomatic menopausal state: Secondary | ICD-10-CM | POA: Diagnosis not present

## 2023-03-22 DIAGNOSIS — Z13820 Encounter for screening for osteoporosis: Secondary | ICD-10-CM

## 2023-03-22 DIAGNOSIS — G4733 Obstructive sleep apnea (adult) (pediatric): Secondary | ICD-10-CM

## 2023-03-22 DIAGNOSIS — H9203 Otalgia, bilateral: Secondary | ICD-10-CM | POA: Diagnosis not present

## 2023-03-22 DIAGNOSIS — Z0000 Encounter for general adult medical examination without abnormal findings: Secondary | ICD-10-CM | POA: Diagnosis not present

## 2023-03-22 NOTE — Patient Instructions (Signed)
If ears not improving over the next few days please let me know and I can send in a round of prednisone.

## 2023-03-22 NOTE — Patient Instructions (Signed)
Sharon Reilly , Thank you for taking time to come for your Medicare Wellness Visit. I appreciate your ongoing commitment to your health goals. Please review the following plan we discussed and let me know if I can assist you in the future.   These are the goals we discussed:  Goals       Activity and Exercise Increased      She would like to exercise more and go out more. She states the community does have a movie night.       Exercise 150 min/wk Moderate Activity      Once back pain is resolved and feet pain is better will start back exercising and walking. Was walking 1 mile before this started happening.      Exercise 3x per week (30 min per time) (pt-stated)      She would like to participate in a structured exercise program such as Silver Sneakers or water aerobics a few times per week at the [facility].      Exercise 3x per week (30 min per time)      Would like to get on track and try and walk some this year.      Exercise 5x per week (10 minutes) (pt-stated)      Medication Management      Patient Goals/Self-Care Activities Over the next 365 days, patient will:  take medications as prescribed  Follow Up Plan: Telephone follow up appointment with care management team member scheduled for:  1 yr      Patient Stated (pt-stated)      03/12/2021 AWV Goal: Exercise for General Health  Patient will verbalize understanding of the benefits of increased physical activity: Exercising regularly is important. It will improve your overall fitness, flexibility, and endurance. Regular exercise also will improve your overall health. It can help you control your weight, reduce stress, and improve your bone density. Over the next year, patient will increase physical activity as tolerated with a goal of at least 150 minutes of moderate physical activity per week.  You can tell that you are exercising at a moderate intensity if your heart starts beating faster and you start breathing faster but can  still hold a conversation. Moderate-intensity exercise ideas include: Walking 1 mile (1.6 km) in about 15 minutes Biking Hiking Golfing Dancing Water aerobics Patient will verbalize understanding of everyday activities that increase physical activity by providing examples like the following: Yard work, such as: Insurance underwriter Gardening Washing windows or floors Patient will be able to explain general safety guidelines for exercising:  Before you start a new exercise program, talk with your health care provider. Do not exercise so much that you hurt yourself, feel dizzy, or get very short of breath. Wear comfortable clothes and wear shoes with good support. Drink plenty of water while you exercise to prevent dehydration or heat stroke. Work out until your breathing and your heartbeat get faster.       Patient Stated (pt-stated)      03/16/2022 AWV Goal: Exercise for General Health  Patient will verbalize understanding of the benefits of increased physical activity: Exercising regularly is important. It will improve your overall fitness, flexibility, and endurance. Regular exercise also will improve your overall health. It can help you control your weight, reduce stress, and improve your bone density. Over the next year, patient will increase physical activity as tolerated with a goal of at  least 150 minutes of moderate physical activity per week.  You can tell that you are exercising at a moderate intensity if your heart starts beating faster and you start breathing faster but can still hold a conversation. Moderate-intensity exercise ideas include: Walking 1 mile (1.6 km) in about 15 minutes Biking Hiking Golfing Dancing Water aerobics Patient will verbalize understanding of everyday activities that increase physical activity by providing examples like the following: Yard work, such as: Land Gardening Washing windows or floors Patient will be able to explain general safety guidelines for exercising:  Before you start a new exercise program, talk with your health care provider. Do not exercise so much that you hurt yourself, feel dizzy, or get very short of breath. Wear comfortable clothes and wear shoes with good support. Drink plenty of water while you exercise to prevent dehydration or heat stroke. Work out until your breathing and your heartbeat get faster.         This is a list of the screening recommended for you and due dates:  Health Maintenance  Topic Date Due   Pneumonia Vaccine (1 of 2 - PCV) Never done   COVID-19 Vaccine (4 - 2024-25 season) 10/18/2022   Medicare Annual Wellness Visit  03/21/2024   Mammogram  01/12/2025   Colon Cancer Screening  11/01/2025   DTaP/Tdap/Td vaccine (3 - Td or Tdap) 06/25/2031   Flu Shot  Completed   DEXA scan (bone density measurement)  Completed   Hepatitis C Screening  Completed   Zoster (Shingles) Vaccine  Completed   HPV Vaccine  Aged Out

## 2023-03-22 NOTE — Progress Notes (Signed)
Acute Office Visit  Subjective:     Patient ID: Sharon Reilly, female    DOB: Jul 03, 1955, 68 y.o.   MRN: [id number]  Chief Complaint  Patient presents with   Ear Pain   Sinusitis    HPI Patient is in today for ear fullness and cough and sinus pressure.  She says she usually takes an allergy pill at night which helps her but she has been holding off because she has not had a working CPAP and it has been making her snoring worse.  She says what bothers her the most is the bilateral ear discomfort.  Is also had some really intense itching in her ears.  ROS      Objective:    BP 132/70   Pulse (!) 58   Ht 5\' 1"  (1.549 m)   Wt 249 lb (112.9 kg)   SpO2 99%   BMI 47.05 kg/m    Physical Exam Constitutional:      Appearance: Normal appearance.  HENT:     Head: Normocephalic and atraumatic.     Right Ear: Tympanic membrane, ear canal and external ear normal. There is no impacted cerumen.     Left Ear: Tympanic membrane, ear canal and external ear normal. There is no impacted cerumen.     Ears:     Comments: Fluid bubbles behind both TMs    Nose: Nose normal.     Mouth/Throat:     Pharynx: Oropharynx is clear.  Eyes:     Conjunctiva/sclera: Conjunctivae normal.  Cardiovascular:     Rate and Rhythm: Normal rate and regular rhythm.  Pulmonary:     Effort: Pulmonary effort is normal.     Breath sounds: Normal breath sounds.  Musculoskeletal:     Cervical back: Neck supple. No tenderness.  Lymphadenopathy:     Cervical: No cervical adenopathy.  Skin:    General: Skin is warm and dry.  Neurological:     Mental Status: She is alert and oriented to person, place, and time.  Psychiatric:        Mood and Affect: Mood normal.     No results found for any visits on 03/22/23.      Assessment & Plan:   Problem List Items Addressed This Visit       Respiratory   OSA (obstructive sleep apnea)   Fortunately her CPAP machine broke and will need a new one is only about  40 to 68 years old.  She cannot over the name of the company that actually provided the machine.  [facility] Impression: severe obstructive sleep apnea, AHI 50/hour. 354 total events. 25 central apneas, 104 obstructive apneas, 20 mixed apneas, 205 hypopneas. Oxygen desaturation to a nadir of 74% with a mean saturation of 93% on room air. Heart rate 52 beats per minute. All events recorded in supine and prone sleep   Will check on parachute portal on getting a new CPAP. She will need ot get [place] updated insurance info.       Other Visit Diagnoses       Acute ear pain, bilateral    -  Primary      Bilat  ear pain-she does have a little bit of fluid behind both ears but no erythema.  She says she is going to try taking her allergy pill every night and see if that helps she has been avoiding taking it recently because it tends to make her snoring worse especially  since she does not have her CPAP right now.  No orders of the defined types were placed in this encounter.   No follow-ups on file.  Nani Gasser, MD

## 2023-03-22 NOTE — Assessment & Plan Note (Addendum)
Fortunately her CPAP machine broke and will need a new one is only about 88 to 68 years old.  She cannot over the name of the company that actually provided the machine.  [facility] Impression: severe obstructive sleep apnea, AHI 50/hour. 354 total events. 25 central apneas, 104 obstructive apneas, 20 mixed apneas, 205 hypopneas. Oxygen desaturation to a nadir of 74% with a mean saturation of 93% on room air. Heart rate 52 beats per minute. All events recorded in supine and prone sleep   Will check on parachute portal on getting a new CPAP. She will need ot get [place] updated insurance info.

## 2023-03-22 NOTE — Progress Notes (Signed)
Subjective:   Sharon Reilly is a 68 y.o. female who presents for Medicare Annual (Subsequent) preventive examination.  Visit Complete: In person  Patient Medicare AWV questionnaire was completed by the patient on n/a; I have confirmed that all information answered by patient is correct and no changes since this date.  Cardiac Risk Factors include: advanced age (>59men, >25 women);dyslipidemia;hypertension;obesity (BMI >30kg/m2)     Objective:    Today's Vitals   03/22/23 1001 03/22/23 1002  BP: 132/70   Weight: 249 lb (112.9 kg)   Height: 5\' 1"  (1.549 m)   PainSc:  5    Body mass index is 47.05 kg/m.     03/22/2023   10:21 AM 09/16/2022   10:13 AM 03/16/2022    9:14 AM 03/13/2021   11:06 AM 03/12/2021    9:07 AM 11/23/2019   11:35 AM 11/21/2018    9:55 AM  Advanced Directives  Does Patient Have a Medical Advance Directive? No No No No No No No  Would patient like information on creating a medical advance directive? Yes (MAU/Ambulatory/Procedural Areas - Information given) Yes (MAU/Ambulatory/Procedural Areas - Information given) No - Patient declined Yes (MAU/Ambulatory/Procedural Areas - Information given) No - Patient declined  No - Patient declined    Current Medications (verified) Outpatient Encounter Medications as of 03/22/2023  Medication Sig   acetaminophen (TYLENOL) 650 MG CR tablet Take 1 tablet (650 mg total) by mouth every 8 (eight) hours as needed for pain. (Patient taking differently: Take 1,300 mg by mouth 2 (two) times daily as needed for pain.)   albuterol (VENTOLIN HFA) 108 (90 Base) MCG/ACT inhaler Inhale 2 puffs into the lungs every 6 (six) hours as needed for wheezing or shortness of breath.   AMBULATORY NON FORMULARY MEDICATION Medication Name: Needs mask and supplies for her CPAP machine,  set to 10 cm water pressure.   aspirin 81 MG tablet Take 81 mg by mouth daily.   fexofenadine (ALLEGRA) 180 MG tablet Take 1 tablet (180 mg total) by mouth daily.  (Patient taking differently: Take 180 mg by mouth as needed for allergies or rhinitis.)   losartan-hydrochlorothiazide (HYZAAR) 100-25 MG tablet Take 1 tablet by mouth daily.   Magnesium 400 MG CAPS Take by mouth.   methimazole (TAPAZOLE) 5 MG tablet Does not take any on Sat or Sun (Patient taking differently: Take 5 mg by mouth every other day.)   metoprolol tartrate (LOPRESSOR) 100 MG tablet TAKE ONE TABLET BY MOUTH TWICE DAILY.   omeprazole (PRILOSEC) 40 MG capsule Take 1 capsule (40 mg total) by mouth daily.   rosuvastatin (CRESTOR) 20 MG tablet Take 1 tablet (20 mg total) by mouth daily.   No facility-administered encounter medications on file as of 03/22/2023.    Allergies (verified) Amoxicillin, Erythromycin, Nsaids, Sulfonamide derivatives, Doxycycline, and Sulfa antibiotics   History: Past Medical History:  Diagnosis Date   Fibromyalgia    Hyperlipidemia    Hypertension    Lupus    Lupus (systemic lupus erythematosus) (HCC)    NSTEMI (non-ST elevated myocardial infarction) (HCC) 03/08/2016   [facility]   Obesity    Osteoarthritis    Osteoporosis    Past Surgical History:  Procedure Laterality Date   ABDOMINAL HYSTERECTOMY     partial   CARPAL TUNNEL RELEASE     left   CHOLECYSTECTOMY Bilateral 05/14/2016   epicondylitis     left   fistulotomy     great toe fusion     left   MRI  [facility] ortho spec     Dr. Virgie Dad   REDUCTION MAMMAPLASTY Bilateral    right elbow     surgisis biodesign fistula plug     talonavicular joint fusion     LT foot   Family History  Problem Relation Age of Onset   Breast cancer Mother        Diagnosed in her 91's.   Hyperlipidemia Mother    Hypertension Mother    Hyperlipidemia Father    Thyroid disease Sister    Diabetes Brother    Kidney failure Brother    Diabetes Other        aunt   Social History   Socioeconomic History   Marital status: Single    Spouse name: Not on file   Number of children: 1   Years of education: 2 years  college   Highest education level: Associate degree: academic program  Occupational History   Occupation: disabled    Comment: insurance  Tobacco Use   Smoking status: Never   Smokeless tobacco: Never  Vaping Use   Vaping status: Never Used  Substance and Sexual Activity   Alcohol use: No   Drug use: No   Sexual activity: Never  Other Topics Concern   Not on file  Social History Narrative   Lives in apartment at a senior living area. She has one son, he lives in [place]. Her sister lives close by incase she needs anything. She enjoys watching movies and reading.   Social Drivers of Corporate investment banker Strain: Low Risk  (03/22/2023)   Overall Financial Resource Strain (CARDIA)    Difficulty of Paying Living Expenses: Not hard at all  Food Insecurity: No Food Insecurity (03/22/2023)   Hunger Vital Sign    Worried About Running Out of Food in the Last Year: Never true    Ran Out of Food in the Last Year: Never true  Transportation Needs: Unmet Transportation Needs (03/22/2023)   PRAPARE - Transportation    Lack of Transportation (Medical): No    Lack of Transportation (Non-Medical): Yes  Physical Activity: Insufficiently Active (03/22/2023)   Exercise Vital Sign    Days of Exercise per Week: 2 days    Minutes of Exercise per Session: 20 min  Stress: No Stress Concern Present (03/22/2023)   [employer] of Occupational Health - Occupational Stress Questionnaire    Feeling of Stress : Not at all  Social Connections: Socially Isolated (03/22/2023)   Social Connection and Isolation Panel [NHANES]    Frequency of Communication with Friends and Family: More than three times a week    Frequency of Social Gatherings with Friends and Family: More than three times a week    Attends Religious Services: Never    Database administrator or Organizations: No    Attends Engineer, structural: Never    Marital Status: Never married    Tobacco Counseling Counseling given: Not  Answered   Clinical Intake:  Pre-visit preparation completed: Yes  Pain : 0-10 Pain Score: 5  Pain Type: Chronic pain Pain Location: Back Pain Orientation: Lower Pain Descriptors / Indicators: Sharp Pain Onset: More than a month ago Pain Frequency: Intermittent Pain Relieving Factors: Sitting helps releave the pain and tylenol Effect of Pain on Daily Activities: Sometimes the pain makes her less active.  Pain Relieving Factors: Sitting helps releave the pain and tylenol  BMI - recorded: 47.05 Nutritional Status: BMI > 30  Obese Nutritional Risks: None Diabetes: No  How often do you need to have someone help you when you read instructions, pamphlets, or other written materials from your doctor or pharmacy?: 1 - Never What is the last grade level you completed in school?: 14  Interpreter Needed?: No      Activities of Daily Living    03/22/2023   10:08 AM  In your present state of health, do you have any difficulty performing the following activities:  Hearing? 0  Vision? 0  Comment wears glasses.  Difficulty concentrating or making decisions? 1  Comment Some memory issues. She is doing puzzles to help.  Walking or climbing stairs? 1  Comment Climing stairs. She goes very slow up and down stairs.  Dressing or bathing? 0  Doing errands, shopping? 0  Preparing Food and eating ? N  Using the Toilet? N  In the past six months, have you accidently leaked urine? N  Do you have problems with loss of bowel control? N  Managing your Medications? N  Managing your Finances? N  Housekeeping or managing your Housekeeping? N    Patient Care Team: Agapito Games, MD as PCP - General (Family Medicine) Gabriel Carina, [facility] (Inactive) as Pharmacist (Pharmacist) Louann Sjogren, DPM as Consulting Physician (Podiatry) Warden Fillers, MD  Indicate any recent Medical Services you may have received from other than [facility] providers in the past year (date may be approximate).      Assessment:   This is a routine wellness examination for [place].  Hearing/Vision screen Hearing Screening - Comments:: Hearing it good.  Vision Screening - Comments:: Vision is good.    Goals Addressed             This Visit's Progress    Activity and Exercise Increased       She would like to exercise more and go out more. She states the community does have a movie night.       Depression Screen    03/22/2023   10:24 AM 03/22/2023   10:19 AM 03/16/2022    9:14 AM 01/01/2022    1:53 PM 03/12/2021    9:08 AM 02/21/2021   10:01 AM 12/18/2020   10:32 AM  PHQ 2/9 Scores  PHQ - 2 Score 0 0 0 1 0 0 0    Fall Risk    03/22/2023   10:22 AM 09/07/2022    1:38 PM 03/16/2022    9:14 AM 01/01/2022    1:53 PM 03/12/2021    9:08 AM  Fall Risk   Falls in the past year? 0 0 0 0 0  Number falls in past yr: 0 0 0 0 0  Injury with Fall? 0 0 0 0 0  Risk for fall due to : Impaired mobility No Fall Risks Impaired mobility No Fall Risks No Fall Risks  Follow up Falls evaluation completed Falls evaluation completed Falls evaluation completed;Education provided;Falls prevention discussed Falls evaluation completed Falls evaluation completed    MEDICARE RISK AT HOME: Medicare Risk at Home Any stairs in or around the home?: Yes If so, are there any without handrails?: Yes Home free of loose throw rugs in walkways, pet beds, electrical cords, etc?: Yes Adequate lighting in your home to reduce risk of falls?: Yes Life alert?: Yes Use of a cane, walker or w/c?: Yes Grab bars in the bathroom?: No Shower chair or bench in shower?: Yes Elevated toilet seat or a handicapped toilet?: Yes  TIMED UP AND GO:  Was the test performed?  Yes  Length of time to ambulate 10 feet: 15 sec Gait steady and fast with assistive device    Cognitive Function:        03/22/2023   10:24 AM 03/16/2022    9:25 AM 03/12/2021    9:15 AM 11/23/2019   11:34 AM 11/21/2018    9:58 AM  6CIT Screen  What Year? 0 points 0  points 0 points 0 points 0 points  What month? 0 points 0 points 0 points 0 points 0 points  What time? 0 points 0 points 0 points 0 points 0 points  Count back from 20 0 points 0 points 0 points 0 points 0 points  Months in reverse 0 points 0 points 0 points 0 points 0 points  Repeat phrase 0 points 0 points 0 points 6 points 0 points  Total Score 0 points 0 points 0 points 6 points 0 points    Immunizations Immunization History  Administered Date(s) Administered   Fluad Quad(high Dose 65+) 11/21/2018, 12/18/2020, 01/01/2022   Fluad Trivalent(High Dose 65+) 12/31/2022   Influenza Split 12/24/2010   Influenza, High Dose Seasonal PF 11/16/2017   Influenza,inj,Quad PF,6+ Mos 11/29/2012, 10/04/2013, 11/16/2014, 11/21/2015, 10/13/2016, 11/23/2019   Influenza-Unspecified 11/29/2012, 10/04/2013, 11/16/2014, 11/21/2015   Moderna Covid-19 Vaccine Bivalent Booster 23yrs & up 01/30/2020   Moderna Sars-Covid-2 Vaccination 06/01/2019, 07/03/2019   Td 07/04/2010   Tdap 06/24/2021   Zoster Recombinant(Shingrix) 04/16/2021, 06/24/2021    TDAP status: Up to date  Flu Vaccine status: Up to date  Pneumococcal vaccine status: Declined,  Education has been provided regarding the importance of this vaccine but patient still declined. Advised may receive this vaccine at local pharmacy or Health Dept. Aware to provide a copy of the vaccination record if obtained from local pharmacy or Health Dept. Verbalized acceptance and understanding.   Covid-19 vaccine status: Declined, Education has been provided regarding the importance of this vaccine but patient still declined. Advised may receive this vaccine at local pharmacy or Health Dept.or vaccine clinic. Aware to provide a copy of the vaccination record if obtained from local pharmacy or Health Dept. Verbalized acceptance and understanding.  Qualifies for Shingles Vaccine? Yes   Zostavax completed No   Shingrix Completed?: Yes  Screening Tests Health  Maintenance  Topic Date Due   Pneumonia Vaccine 23+ Years old (1 of 2 - PCV) Never done   COVID-19 Vaccine (4 - 2024-25 season) 10/18/2022   Medicare Annual Wellness (AWV)  03/21/2024   MAMMOGRAM  01/12/2025   Colonoscopy  11/01/2025   DTaP/Tdap/Td (3 - Td or Tdap) 06/25/2031   INFLUENZA VACCINE  Completed   DEXA SCAN  Completed   Hepatitis C Screening  Completed   Zoster Vaccines- Shingrix  Completed   HPV VACCINES  Aged Out    Health Maintenance  Health Maintenance Due  Topic Date Due   Pneumonia Vaccine 78+ Years old (1 of 2 - PCV) Never done   COVID-19 Vaccine (4 - 2024-25 season) 10/18/2022    Colorectal cancer screening: Type of screening: Colonoscopy. Completed 11/01/2020. Repeat every 5 years  Mammogram status: Completed 01/13/2023. Repeat every year  Bone Density status: Ordered 03/22/2023. Pt provided with contact info and advised to call to schedule appt.  Lung Cancer Screening: (Low Dose CT Chest recommended if Age 15-80 years, 20 pack-year currently smoking OR have quit w/in 15years.) does not qualify.   Lung Cancer Screening Referral: n/a  Additional Screening:  Hepatitis C Screening: does qualify; Completed 11/21/2018  Vision Screening: Recommended annual ophthalmology  exams for early detection of glaucoma and other disorders of the eye. Is the patient up to date with their annual eye exam?  Yes  Who is the provider or what is the name of the office in which the patient attends annual eye exams? [facility] If pt is not established with a provider, would they like to be referred to a provider to establish care?  N/a .   Dental Screening: Recommended annual dental exams for proper oral hygiene   Community Resource Referral / Chronic Care Management: CRR required this visit?  No   CCM required this visit?  No     Plan:     I have personally reviewed and noted the following in the patient's chart:   Medical and social history Use of alcohol,  tobacco or illicit drugs  Current medications and supplements including opioid prescriptions. Patient is not currently taking opioid prescriptions. Functional ability and status Nutritional status Physical activity Advanced directives List of other physicians Hospitalizations, surgeries, and ER visits in previous 12 months Vitals Screenings to include cognitive, depression, and falls Referrals and appointments  In addition, I have reviewed and discussed with patient certain preventive protocols, quality metrics, and best practice recommendations. A written personalized care plan for preventive services as well as general preventive health recommendations were provided to patient.     Esmond Harps, [place]   03/22/2023   After Visit Summary: (In Person-Printed) AVS printed and given to the patient  Nurse Notes:   Dexa scan ordered

## 2023-04-07 ENCOUNTER — Other Ambulatory Visit: Payer: 59

## 2023-04-08 ENCOUNTER — Ambulatory Visit: Payer: 59 | Admitting: Podiatry

## 2023-04-15 ENCOUNTER — Ambulatory Visit (INDEPENDENT_AMBULATORY_CARE_PROVIDER_SITE_OTHER): Payer: 59 | Admitting: Podiatry

## 2023-04-15 ENCOUNTER — Encounter: Payer: Self-pay | Admitting: Podiatry

## 2023-04-15 DIAGNOSIS — L603 Nail dystrophy: Secondary | ICD-10-CM | POA: Diagnosis not present

## 2023-04-15 NOTE — Progress Notes (Signed)
  Subjective:  Patient ID: Sharon Reilly, female    DOB: 12/29/1955,   MRN: [id number]  Chief Complaint  Patient presents with   Nail Problem    rfc    68 y.o. female presents for follow-up of dystrophic nails and to evaluate improvement in nails.  Relates she has been using the mupirocin but denies any improvement in the nails.   PCP:  Agapito Games, MD    . Denies any other pedal complaints. Denies n/v/f/c.   Past Medical History:  Diagnosis Date   Fibromyalgia    Hyperlipidemia    Hypertension    Lupus    Lupus (systemic lupus erythematosus) (HCC)    NSTEMI (non-ST elevated myocardial infarction) (HCC) 03/08/2016   [facility]   Obesity    Osteoarthritis    Osteoporosis     Objective:  Physical Exam: Vascular: DP/PT pulses 2/4 bilateral. CFT <3 seconds. Absent hair growth on digits. Edema noted to bilateral lower extremities. Xerosis noted bilaterally.  Skin. No lacerations or abrasions bilateral feet. Nails 1 bilateral  are thickened discolored and elongated with subungual debris.  Musculoskeletal: MMT 5/5 bilateral lower extremities in DF, PF, Inversion and Eversion. Deceased ROM in DF of ankle joint.  Neurological: Sensation intact to light touch. Protective sensation intact bilateral.    Assessment:   1. Nail dystrophy   2. Pain due to onychomycosis of toenails of both feet        Plan:  Patient was evaluated and treated and all questions answered. -Examined patient -Discussed treatment options for painful dystrophic nails  -Culture shows periungual abscess and no signs of fungus although cannot be ruled out.  -May discontinue mupirocin at this time. Discussed likely rest of changes are from traumatic damage and will be what she is left with.  -Nails 1-5 bilateral debrided as courtesy today.  -Will follow-up as needed in future.    Louann Sjogren, DPM

## 2023-04-28 ENCOUNTER — Ambulatory Visit: Payer: 59

## 2023-04-28 DIAGNOSIS — Z780 Asymptomatic menopausal state: Secondary | ICD-10-CM | POA: Diagnosis not present

## 2023-04-28 DIAGNOSIS — Z13820 Encounter for screening for osteoporosis: Secondary | ICD-10-CM

## 2023-04-28 DIAGNOSIS — M8588 Other specified disorders of bone density and structure, other site: Secondary | ICD-10-CM | POA: Diagnosis not present

## 2023-04-29 ENCOUNTER — Telehealth: Payer: Self-pay

## 2023-04-29 NOTE — Telephone Encounter (Signed)
 Contacted the patient. She was notified that provider is currently out of the office. Patient is okay to wait until her appointment with the provider on 05/03/23. Patient was informed to keep hydrated. Patient is aware to seek care immediately if symptoms should worsen or change.

## 2023-04-29 NOTE — Telephone Encounter (Signed)
 Copied from CRM [id number]. Topic: Clinical - Medical Advice >> Apr 28, 2023  8:49 AM Nila Nephew wrote: Reason for CRM: Patient is calling to seek medical advice. Patient states has been coughing intermittently with a slight runny nose. Patient seeking medical advice to quell cough.  Patient has an upcoming appointment with PCP on 03/17.

## 2023-05-03 ENCOUNTER — Ambulatory Visit (INDEPENDENT_AMBULATORY_CARE_PROVIDER_SITE_OTHER): Payer: Medicare HMO | Admitting: Family Medicine

## 2023-05-03 ENCOUNTER — Encounter: Payer: Self-pay | Admitting: Family Medicine

## 2023-05-03 VITALS — BP 132/57 | HR 59 | Ht 61.0 in | Wt 257.0 lb

## 2023-05-03 DIAGNOSIS — R7301 Impaired fasting glucose: Secondary | ICD-10-CM | POA: Diagnosis not present

## 2023-05-03 DIAGNOSIS — N1831 Chronic kidney disease, stage 3a: Secondary | ICD-10-CM

## 2023-05-03 DIAGNOSIS — H9203 Otalgia, bilateral: Secondary | ICD-10-CM

## 2023-05-03 DIAGNOSIS — I10 Essential (primary) hypertension: Secondary | ICD-10-CM

## 2023-05-03 DIAGNOSIS — K2100 Gastro-esophageal reflux disease with esophagitis, without bleeding: Secondary | ICD-10-CM | POA: Diagnosis not present

## 2023-05-03 LAB — POCT GLYCOSYLATED HEMOGLOBIN (HGB A1C): Hemoglobin A1C: 5.5 % (ref 4.0–5.6)

## 2023-05-03 MED ORDER — METOPROLOL TARTRATE 100 MG PO TABS: 100.0000 mg | ORAL_TABLET | Freq: Two times a day (BID) | ORAL | 1 refills | Status: DC

## 2023-05-03 NOTE — Assessment & Plan Note (Signed)
 Blood pressure at goal today. -Continue current regimen

## 2023-05-03 NOTE — Assessment & Plan Note (Signed)
 Continue daily statin.  Plan to recheck lipids at next visit.

## 2023-05-03 NOTE — Assessment & Plan Note (Signed)
 A1c looks great today at 5.5.  She has done a great job in getting her A1c back down.

## 2023-05-03 NOTE — Assessment & Plan Note (Signed)
 Continue to monitor renal function every 6 months.

## 2023-05-03 NOTE — Progress Notes (Signed)
 Hi Sharon Reilly, T-score is -1.2 which puts you into the mildly thin bone category called osteopenia.  No significant change compared to prior bone density back in 2021 which is great.   The current recommendation for osteopenia (mildly thin bones) treatment includes:   #1 calcium-total of 1200 mg of calcium daily.  If you eat a very calcium rich diet you may be able to obtain that without a supplement.  If not, then I recommend calcium 500 mg twice a day.  There are several products over-the-counter such as Caltrate D and Viactiv chews which are great options that contain calcium and vitamin D. #2 vitamin D-recommend 800 international units daily. #3 exercise-recommend 30 minutes of weightbearing exercise 3 days a week.  Resistance training ,such as doing bands and light weights, can be particularly helpful.

## 2023-05-03 NOTE — Assessment & Plan Note (Signed)
 Went back to taking daily PPI.  We did discuss that she could try alternating with an H2 blocker to decrease her use of the PPI.  Continue to avoid triggers that make her GERD worse.

## 2023-05-03 NOTE — Progress Notes (Signed)
 Established Patient Office Visit  Subjective  Patient ID: Sharon Reilly, female    DOB: 10-16-1955  Age: 68 y.o. MRN: [id number]  Chief Complaint  Patient presents with   Hypertension   ifg    HPI C/o ear pain bilat for over a week. Had a coughing fit a couple of nights ago.  No fever or chills.  She does check her blood pressure at home and has been getting some good reassuring numbers at home no recent chest pain or shortness of breath.  He did try to decrease her omeprazole to every other day she had read about some possible links to dementia.  She started getting some chest discomfort and so went back to taking it daily.     ROS    Objective:     BP (!) 132/57   Pulse (!) 59   Ht 5\' 1"  (1.549 m)   Wt 257 lb (116.6 kg)   SpO2 98%   BMI 48.56 kg/m    Physical Exam Constitutional:      Appearance: Normal appearance.  HENT:     Head: Normocephalic and atraumatic.     Right Ear: Tympanic membrane, ear canal and external ear normal. There is no impacted cerumen.     Left Ear: Ear canal and external ear normal. There is no impacted cerumen.     Ears:     Comments: A few bubbles behind the left TM.    Nose: Nose normal.     Mouth/Throat:     Pharynx: Oropharynx is clear.  Eyes:     Conjunctiva/sclera: Conjunctivae normal.  Cardiovascular:     Rate and Rhythm: Normal rate and regular rhythm.  Pulmonary:     Effort: Pulmonary effort is normal.     Breath sounds: Normal breath sounds.  Musculoskeletal:     Cervical back: Neck supple. No tenderness.  Lymphadenopathy:     Cervical: No cervical adenopathy.  Skin:    General: Skin is warm and dry.  Neurological:     Mental Status: She is alert and oriented to person, place, and time.  Psychiatric:        Mood and Affect: Mood normal.      Results for orders placed or performed in visit on 05/03/23  POCT HgB A1C  Result Value Ref Range   Hemoglobin A1C 5.5 4.0 - 5.6 %   HbA1c POC (<> result, manual  entry)     HbA1c, POC (prediabetic range)     HbA1c, POC (controlled diabetic range)        The ASCVD Risk score (Arnett DK, et al., 2019) failed to calculate for the following reasons:   Risk score cannot be calculated because patient has a medical history suggesting prior/existing ASCVD    Assessment & Plan:   Problem List Items Addressed This Visit       Cardiovascular and Mediastinum   Essential hypertension, benign   Blood pressure at goal today.  Continue current regimen.      Relevant Medications   metoprolol tartrate (LOPRESSOR) 100 MG tablet   Other Relevant Orders   Basic Metabolic Panel (BMET)     Digestive   Gastroesophageal reflux disease with esophagitis   Went back to taking daily PPI.  We did discuss that she could try alternating with an H2 blocker to decrease her use of the PPI.  Continue to avoid triggers that make her GERD worse.        Endocrine   IFG (impaired  fasting glucose) - Primary   A1c looks great today at 5.5.  She has done a great job in getting her A1c back down.      Relevant Orders   POCT HgB A1C (Completed)   Basic Metabolic Panel (BMET)     Genitourinary   CKD (chronic kidney disease) stage 3, GFR 30-59 ml/min (HCC)   Continue to monitor renal function every 6 months.      Other Visit Diagnoses       Acute ear pain, bilateral          Bilateral ear pain there is a little bit of an effusion behind the left TM.  She does not do well with nasal sprays they tend to make her feel dizzy she has an over-the-counter antihistamine that she started taking last night and does says it does usually help just encouraged her to continue to take that daily probably for the next couple of months.  Return in about 6 months (around 11/03/2023) for Hypertension, Pre-diabetes.    Nani Gasser, MD

## 2023-05-04 ENCOUNTER — Encounter: Payer: Self-pay | Admitting: Family Medicine

## 2023-05-04 LAB — BASIC METABOLIC PANEL
BUN/Creatinine Ratio: 14 (ref 12–28)
BUN: 17 mg/dL (ref 8–27)
CO2: 26 mmol/L (ref 20–29)
Calcium: 9.2 mg/dL (ref 8.7–10.3)
Chloride: 106 mmol/L (ref 96–106)
Creatinine, Ser: 1.22 mg/dL — ABNORMAL HIGH (ref 0.57–1.00)
Glucose: 86 mg/dL (ref 70–99)
Potassium: 4 mmol/L (ref 3.5–5.2)
Sodium: 147 mmol/L — ABNORMAL HIGH (ref 134–144)
eGFR: 49 mL/min/{1.73_m2} — ABNORMAL LOW (ref >59)

## 2023-05-04 NOTE — Progress Notes (Signed)
 Hi Sharon Reilly, kidney function is stable.  Normally you are around 1.1-1.2 and it was 1.2 this time.  Your sodium level was just slightly elevated similar to about 7 months ago. We will keep an eye on this.

## 2023-06-10 DIAGNOSIS — E0590 Thyrotoxicosis, unspecified without thyrotoxic crisis or storm: Secondary | ICD-10-CM | POA: Diagnosis not present

## 2023-09-04 ENCOUNTER — Other Ambulatory Visit: Payer: Self-pay | Admitting: Family Medicine

## 2023-09-22 ENCOUNTER — Ambulatory Visit: Payer: Self-pay

## 2023-09-22 NOTE — Telephone Encounter (Signed)
 FYI Only or Action Required?: FYI only for provider.  Patient was last seen in primary care on 05/03/2023 by Alvan Dorothyann BIRCH, MD.  Called Nurse Triage reporting Open Wound.  Symptoms began several weeks ago.  Interventions attempted: OTC medications: Vaseline, Cortisone Cream.  Symptoms are: gradually worsening.  Triage Disposition: See Physician Within 24 Hours  Patient/caregiver understands and will follow disposition?: Yes  **Soonest appointment was on 8/8, patient referred to UC for immediate evaluation, she agrees with plan of care**              Copied from CRM [id number]. Topic: Clinical - Red Word Triage >> Sep 22, 2023  2:10 PM Adrianna P wrote: [facility] that prompted transfer to Nurse Triage: Red sore on leg, sharp pain in bone, pain is 5/10 Reason for Disposition  [1] Looks infected (e.g., spreading redness, pus) AND [2] no fever  Answer Assessment - Initial Assessment Questions 1. LOCATION: Where is the wound located?      Back of leg, right leg near ankle  2. WOUND APPEARANCE: What does the wound look like?       Round, scab present, around the scab is redness   3. SIZE: If redness is present, ask: What is the size of the red area? (Inches, centimeters, or compare to size of a coin)      1-2 inches  4. SPREAD: What's changed in the last day?  Do you see any red streaks coming from the wound?     No  5. ONSET: When did it start to look infected?     x 3 weeks  6. MECHANISM: How did the wound start, what was the cause?     Unknown, possible bite suspected   7. PAIN: Do you have any pain?  If Yes, ask: How bad is the pain?  (e.g., Scale 1-10; mild, moderate, or severe)     5/10  8. FEVER: Do you have a fever? If Yes, ask: What is your temperature, how was it measured, and when did it start?     No   9. OTHER SYMPTOMS: Do you have any other symptoms? (e.g., shaking chills, weakness, rash elsewhere on body) No   Right  foot swelling. For home care she has tried Vaseline.  Protocols used: Wound Infection Suspected-A-AH

## 2023-09-27 ENCOUNTER — Ambulatory Visit
Admission: EM | Admit: 2023-09-27 | Discharge: 2023-09-27 | Disposition: A | Discharge status: Home / Self Care | Attending: Internal Medicine | Admitting: Internal Medicine

## 2023-09-27 DIAGNOSIS — L03115 Cellulitis of right lower limb: Secondary | ICD-10-CM | POA: Diagnosis not present

## 2023-09-27 DIAGNOSIS — R600 Localized edema: Secondary | ICD-10-CM

## 2023-09-27 MED ORDER — CEPHALEXIN 500 MG PO CAPS: 500.0000 mg | ORAL_CAPSULE | Freq: Two times a day (BID) | ORAL | 0 refills | Status: DC

## 2023-09-27 MED ORDER — MUPIROCIN 2 % EX OINT: 1.0000 | TOPICAL_OINTMENT | Freq: Two times a day (BID) | CUTANEOUS | 0 refills | Status: DC

## 2023-09-27 NOTE — ED Triage Notes (Signed)
 Pt c/o wound to back of RT calf x 1 month. Painful and causing foot swelling. Vaseline prn.

## 2023-09-27 NOTE — ED Provider Notes (Signed)
 [facility]    CSN: [id number] Arrival date & time: 09/27/23  1110      History   Chief Complaint Chief Complaint  Patient presents with   Wound Check    RT calf    HPI Sharon Reilly is a 68 y.o. female.   Sharon Reilly is a 68 y.o. female presenting for chief complaint of redness, swelling, pain, and rash to the right posterior calf/ankle that started approximately 1 month ago.  She wonders if she may have been bit by an insect a month ago causing rash to the posterior calf.  One of the lesions has now become red, swollen, tender, and itchy.  Swelling and redness have not changed much over the last 1 month, however she developed pain to the area 2 weeks ago.  Denies drainage from lesion.  She has been covering the open wound with Vaseline which seems to be helping.  Denies streaking redness to the upper/lower calf, dizziness, shortness of breath, chest pain, history of DVT, recent long periods of sitting, numbness or tingling to the right foot, and recent known trauma/injuries to the right calf.  Denies fever, chills, body aches, nausea, and vomiting.  History of lupus and chronic kidney disease.  Denies recent antibiotic or steroid use. Allergic to multiple antibiotics including sulfamethoxazole, amoxicillin , and doxycycline .  She experiences rash with all of the after mentioned medications.     Past Medical History:  Diagnosis Date   Fibromyalgia    Hyperlipidemia    Hypertension    Lupus    Lupus (systemic lupus erythematosus) (HCC)    NSTEMI (non-ST elevated myocardial infarction) (HCC) 03/08/2016   [facility]   Obesity    Osteoarthritis    Osteoporosis     Patient Active Problem List   Diagnosis Date Noted   OAB (overactive bladder) 12/18/2020   Chronic pain of right thumb 12/18/2020   Umbilical hernia without obstruction and without gangrene 06/26/2019   Hallux limitus of right foot 03/02/2019   Onychomycosis of toenail 12/14/2018   Eczema  06/14/2018   CKD (chronic kidney disease) stage 3, GFR 30-59 ml/min (HCC) 04/08/2017   Secondary hyperparathyroidism of renal origin (HCC) 01/11/2017   Anemia in stage 3 chronic kidney disease (HCC) 11/18/2016   Hallux rigidus of right foot 09/29/2016   Primary osteoarthritis of both knees 09/29/2016   Gallstones 05/06/2016   Pure hypercholesterolemia 03/31/2016   CAD (coronary artery disease) 03/09/2016   NSTEMI (non-ST elevated myocardial infarction) (HCC) 03/08/2016   Internal hemorrhoids 10/14/2015   Gastroesophageal reflux disease with esophagitis 08/14/2015   Lymphadenopathy of right cervical region 08/05/2015   Lumbago with sciatica 11/07/2014   Left thyroid  nodule 02/01/2014   Nonpalpable mass of neck 02/01/2014   OSA (obstructive sleep apnea) 10/04/2013   Lactose intolerance 05/31/2013   IFG (impaired fasting glucose) 09/01/2012   Fibromyalgia 07/10/2010   Tubular adenoma of colon 07/10/2010   Anal fistula 07/10/2010   Essential hypertension, benign 06/26/2010   Morbid obesity (HCC) 06/26/2010   HYPERLIPIDEMIA 05/20/2010   Osteoporosis 05/20/2010    Past Surgical History:  Procedure Laterality Date   ABDOMINAL HYSTERECTOMY     partial   CARPAL TUNNEL RELEASE     left   CHOLECYSTECTOMY Bilateral 05/14/2016   epicondylitis     left   fistulotomy     great toe fusion     left   MRI syr ortho spec     Dr. Lynett   REDUCTION MAMMAPLASTY Bilateral  right elbow     surgisis biodesign fistula plug     talonavicular joint fusion     LT foot    OB History   No obstetric history on file.      Home Medications    Prior to Admission medications   Medication Sig Start Date End Date Taking? Authorizing Provider  cephALEXin  (KEFLEX ) 500 MG capsule Take 1 capsule (500 mg total) by mouth 2 (two) times daily for 7 days. 09/27/23 10/04/23 Yes StanhopeDorna HERO, FNP  mupirocin  ointment (BACTROBAN ) 2 % Apply 1 Application topically 2 (two) times daily. 09/27/23  Yes  Enedelia Dorna HERO, FNP  acetaminophen  (TYLENOL ) 650 MG CR tablet Take 1 tablet (650 mg total) by mouth every 8 (eight) hours as needed for pain. Patient taking differently: Take 1,300 mg by mouth 2 (two) times daily as needed for pain. 08/23/19   Curtis Debby PARAS, MD  albuterol  (VENTOLIN  HFA) 108 (90 Base) MCG/ACT inhaler Inhale 2 puffs into the lungs every 6 (six) hours as needed for wheezing or shortness of breath. 02/21/21   Alvan Dorothyann BIRCH, MD  AMBULATORY NON FORMULARY MEDICATION Medication Name: Needs mask and supplies for her CPAP machine,  set to 10 cm water pressure. 09/07/22   Alvan Dorothyann BIRCH, MD  aspirin 81 MG tablet Take 81 mg by mouth daily.    [provider]  fexofenadine  (ALLEGRA ) 180 MG tablet Take 1 tablet (180 mg total) by mouth daily. Patient taking differently: Take 180 mg by mouth as needed for allergies or rhinitis. 09/16/10   Bowen, Darice BRAVO, DO  losartan -hydrochlorothiazide  (HYZAAR) 100-25 MG tablet Take 1 tablet by mouth daily. 09/07/23   Alvan Dorothyann BIRCH, MD  Magnesium 400 MG CAPS Take by mouth.    [provider]  methimazole  (TAPAZOLE ) 5 MG tablet Does not take any on Sat or Sun Patient taking differently: Take 5 mg by mouth every other day. 09/07/22   Alvan Dorothyann BIRCH, MD  metoprolol  tartrate (LOPRESSOR ) 100 MG tablet Take 1 tablet (100 mg total) by mouth 2 (two) times daily. 05/03/23   Alvan Dorothyann BIRCH, MD  omeprazole  (PRILOSEC) 40 MG capsule Take 1 capsule (40 mg total) by mouth daily. 12/07/22   Alvan Dorothyann BIRCH, MD  rosuvastatin  (CRESTOR ) 20 MG tablet Take 1 tablet (20 mg total) by mouth daily. 12/31/22   Alvan Dorothyann BIRCH, MD    Family History Family History  Problem Relation Age of Onset   Breast cancer Mother        Diagnosed in her 29's.   Hyperlipidemia Mother    Hypertension Mother    Hyperlipidemia Father    Thyroid  disease Sister    Diabetes Brother    Kidney failure Brother    Diabetes Other         aunt    Social History Social History   Tobacco Use   Smoking status: Never   Smokeless tobacco: Never  Vaping Use   Vaping status: Never Used  Substance Use Topics   Alcohol use: No   Drug use: No     Allergies   Amoxicillin , Erythromycin, Nsaids, Sulfonamide derivatives, Doxycycline , and Sulfa antibiotics   Review of Systems Review of Systems Per HPI  Physical Exam Triage Vital Signs ED Triage Vitals  Encounter Vitals Group     BP 09/27/23 1118 (!) 154/81     Girls Systolic BP Percentile --      Girls Diastolic BP Percentile --      Boys Systolic BP  Percentile --      Boys Diastolic BP Percentile --      Pulse Rate 09/27/23 1118 62     Resp 09/27/23 1118 17     Temp 09/27/23 1118 97.9 F (36.6 C)     Temp Source 09/27/23 1118 Oral     SpO2 09/27/23 1118 97 %     Weight --      Height --      Head Circumference --      Peak Flow --      Pain Score 09/27/23 1117 4     Pain Loc --      Pain Education --      Exclude from Growth Chart --    No data found.  Updated Vital Signs BP (!) 154/81 (BP Location: Right Arm)   Pulse 62   Temp 97.9 F (36.6 C) (Oral)   Resp 17   SpO2 97%   Visual Acuity Right Eye Distance:   Left Eye Distance:   Bilateral Distance:    Right Eye Near:   Left Eye Near:    Bilateral Near:     Physical Exam Vitals and nursing note reviewed.  Constitutional:      Appearance: She is not ill-appearing or toxic-appearing.  HENT:     Head: Normocephalic and atraumatic.     Right Ear: Hearing and external ear normal.     Left Ear: Hearing and external ear normal.     Nose: Nose normal.     Mouth/Throat:     Lips: Pink.  Eyes:     General: Lids are normal. Vision grossly intact. Gaze aligned appropriately.     Extraocular Movements: Extraocular movements intact.     Conjunctiva/sclera: Conjunctivae normal.  Cardiovascular:     Rate and Rhythm: Normal rate and regular rhythm.     Heart sounds: Normal heart sounds, S1  normal and S2 normal.  Pulmonary:     Effort: Pulmonary effort is normal. No respiratory distress.     Breath sounds: Normal breath sounds and air entry.  Musculoskeletal:     Cervical back: Neck supple.     Right lower leg: 2+ Pitting Edema (Anterior right ankle and right calf) present.     Left lower leg: 1+ Edema present.     Right ankle: Swelling present.     Comments: +2 bilateral dorsalis pedis pulses.  Ambulatory with steady gait with cane at baseline.  5/5 strength against resistance with dorsiflexion and plantarflexion of the bilateral lower extremities.  Sensation intact to distal bilateral lower extremities.  Less than 2 cap refill.  Negative Homans' sign bilaterally.  Skin:    General: Skin is warm and dry.     Capillary Refill: Capillary refill takes less than 2 seconds.     Findings: Rash (Hyperpigmented old papular lesions to the posterior right calf, central open wound/abrasion appearing lesion with surrounding erythema, warmth, and tenderness present.  No active drainage.) present.  Neurological:     General: No focal deficit present.     Mental Status: She is alert and oriented to person, place, and time. Mental status is at baseline.     Cranial Nerves: No dysarthria or facial asymmetry.  Psychiatric:        Mood and Affect: Mood normal.        Speech: Speech normal.        Behavior: Behavior normal.        Thought Content: Thought content normal.  Judgment: Judgment normal.          UC Treatments / Results  Labs (all labs ordered are listed, but only abnormal results are displayed) Labs Reviewed - No data to display  EKG   Radiology No results found.  Procedures Procedures (including critical care time)  Medications Ordered in UC Medications - No data to display  Initial Impression / Assessment and Plan / UC Course  I have reviewed the triage vital signs and the nursing notes.  Pertinent labs & imaging results that were available during my  care of the patient were reviewed by me and considered in my medical decision making (see chart for details).   1.  Cellulitis of right lower extremity, pedal edema Presentation is consistent with cellulitis of the right lower extremity likely secondary to infected insect bite. History of CKD stage III, most recent creatinine is 1.22, GFR 49.  Creatinine clearance is 52 mL/min. She is a candidate for normal dosing without renal adjustment of Keflex .  Discussed risks of rash with Keflex  given amoxicillin  rash allergy, patient is agreeable to proceed with cephalosporin use.  Keflex  500 mg twice daily for 7 days ordered. Topical mupirocin  ordered twice daily for 7 days. Advised to watch for new or worsening signs of infection such as redness, swelling, pus, pain, etc.  Neurovascularly intact to baseline currently. Recommend follow-up with PCP within the next 1 week to ensure antibiotics are working. Low suspicion for DVT, however ER return precautions have been discussed.  Counseled patient on potential for adverse effects with medications prescribed/recommended today, strict ER and return-to-clinic precautions discussed, patient verbalized understanding.    Final Clinical Impressions(s) / UC Diagnoses   Final diagnoses:  Cellulitis of right lower extremity  Pedal edema     Discharge Instructions      You have cellulitis which is a superficial skin infection.  Take antibiotic every 12 hours with a snack/food for the next 7 days. Watch for worsening signs of infection to the lesion such as spreading redness, swelling, pus drainage, pain, fever/chills.  Return if you develop fever, chills, nausea, vomiting, etc as well.  If you develop any new or worsening symptoms or if your symptoms do not start to improve, please return here or follow-up with your primary care provider. If your symptoms are severe, please go to the emergency room.    ED Prescriptions     Medication Sig Dispense  Auth. Provider   cephALEXin  (KEFLEX ) 500 MG capsule Take 1 capsule (500 mg total) by mouth 2 (two) times daily for 7 days. 14 capsule Callahan Peddie M, FNP   mupirocin  ointment (BACTROBAN ) 2 % Apply 1 Application topically 2 (two) times daily. 22 g Enedelia Dorna HERO, FNP      PDMP not reviewed this encounter.   Enedelia Dorna HERO, FNP 09/27/23 1226

## 2023-09-27 NOTE — Discharge Instructions (Addendum)
 You have cellulitis which is a superficial skin infection.  Take antibiotic every 12 hours with a snack/food for the next 7 days. Watch for worsening signs of infection to the lesion such as spreading redness, swelling, pus drainage, pain, fever/chills. Return if you develop fever, chills, nausea, vomiting, etc as well.  If you develop any new or worsening symptoms or if your symptoms do not start to improve, please return here or follow-up with your primary care provider. If your symptoms are severe, please go to the emergency room.

## 2023-10-04 ENCOUNTER — Ambulatory Visit

## 2023-10-04 ENCOUNTER — Encounter: Payer: Self-pay | Admitting: Family Medicine

## 2023-10-04 ENCOUNTER — Ambulatory Visit (INDEPENDENT_AMBULATORY_CARE_PROVIDER_SITE_OTHER): Admitting: Family Medicine

## 2023-10-04 VITALS — BP 133/63 | HR 62 | Ht 61.0 in | Wt 257.0 lb

## 2023-10-04 DIAGNOSIS — R600 Localized edema: Secondary | ICD-10-CM

## 2023-10-04 DIAGNOSIS — N1831 Chronic kidney disease, stage 3a: Secondary | ICD-10-CM | POA: Diagnosis not present

## 2023-10-04 DIAGNOSIS — L97911 Non-pressure chronic ulcer of unspecified part of right lower leg limited to breakdown of skin: Secondary | ICD-10-CM

## 2023-10-04 DIAGNOSIS — D631 Anemia in chronic kidney disease: Secondary | ICD-10-CM | POA: Diagnosis not present

## 2023-10-04 DIAGNOSIS — M170 Bilateral primary osteoarthritis of knee: Secondary | ICD-10-CM | POA: Diagnosis not present

## 2023-10-04 DIAGNOSIS — M25562 Pain in left knee: Secondary | ICD-10-CM | POA: Diagnosis not present

## 2023-10-04 DIAGNOSIS — M25561 Pain in right knee: Secondary | ICD-10-CM | POA: Diagnosis not present

## 2023-10-04 MED ORDER — CEPHALEXIN 500 MG PO CAPS: 500.0000 mg | ORAL_CAPSULE | Freq: Two times a day (BID) | ORAL | 0 refills | Status: AC

## 2023-10-04 NOTE — Assessment & Plan Note (Signed)
 No renal dosing need on ABX

## 2023-10-04 NOTE — Assessment & Plan Note (Signed)
Recheck CBC today. 

## 2023-10-04 NOTE — Progress Notes (Signed)
 Established Patient Office Visit  Subjective  Patient ID: Sharon Reilly, female    DOB: 1955-02-22  Age: 68 y.o. MRN: [id number]  Chief Complaint  Patient presents with   Follow-up    HPI Lua is here today for follow-up from urgent care.  She had noted a lesion on her posterior right lower leg that was red tender and swollen.  It had been present for almost a month before she went to urgent care on August 11.  She thought it looked kind of like a spider bite.  They placed her on Keflex  because she cannot take Doxy or sulfa medications or penicillin.  She says she does feel like it does look better there was a red ring around it and that seems to have cleared a little bit but it still swollen and painful in fact she has had swelling from the knee down in both legs.   Bilat knee pain that ha been worse lately.  She says even just the air conditioning hitting her knees will cause her to have severe pain.  Last time she had x-rays was in 2021 with Dr. Curtis our sports med doc.      ROS    Objective:     BP 133/63   Pulse 62   Ht 5' 1 (1.549 m)   Wt 257 lb (116.6 kg)   SpO2 95%   BMI 48.56 kg/m    Physical Exam     Results for orders placed or performed in visit on 10/04/23  CBC with Differential/Platelet  Result Value Ref Range   WBC 5.0 3.4 - 10.8 x10E3/uL   RBC 3.39 (L) 3.77 - 5.28 x10E6/uL   Hemoglobin 10.4 (L) 11.1 - 15.9 g/dL   Hematocrit 67.7 (L) 65.9 - 46.6 %   MCV 95 79 - 97 fL   MCH 30.7 26.6 - 33.0 pg   MCHC 32.3 31.5 - 35.7 g/dL   RDW 86.3 88.2 - 84.5 %   Platelets 254 150 - 450 x10E3/uL   Neutrophils 56 Not Estab. %   Lymphs 34 Not Estab. %   Monocytes 7 Not Estab. %   Eos 2 Not Estab. %   Basos 1 Not Estab. %   Neutrophils Absolute 2.8 1.4 - 7.0 x10E3/uL   Lymphocytes Absolute 1.7 0.7 - 3.1 x10E3/uL   Monocytes Absolute 0.3 0.1 - 0.9 x10E3/uL   EOS (ABSOLUTE) 0.1 0.0 - 0.4 x10E3/uL   Basophils Absolute 0.0 0.0 - 0.2 x10E3/uL   Immature  Granulocytes 0 Not Estab. %   Immature Grans (Abs) 0.0 0.0 - 0.1 x10E3/uL  CMP14+EGFR  Result Value Ref Range   Glucose 100 (H) 70 - 99 mg/dL   BUN 25 8 - 27 mg/dL   Creatinine, Ser 8.75 (H) 0.57 - 1.00 mg/dL   eGFR 47 (L) >40 fO/fpw/8.26   BUN/Creatinine Ratio 20 12 - 28   Sodium 142 134 - 144 mmol/L   Potassium 3.7 3.5 - 5.2 mmol/L   Chloride 107 (H) 96 - 106 mmol/L   CO2 21 20 - 29 mmol/L   Calcium  9.0 8.7 - 10.3 mg/dL   Total Protein 6.3 6.0 - 8.5 g/dL   Albumin 3.7 (L) 3.9 - 4.9 g/dL   Globulin, Total 2.6 1.5 - 4.5 g/dL   Bilirubin Total 0.2 0.0 - 1.2 mg/dL   Alkaline Phosphatase 95 44 - 121 IU/L   AST 17 0 - 40 IU/L   ALT 12 0 - 32 IU/L  TSH  Result Value Ref Range   TSH 2.680 0.450 - 4.500 uIU/mL  B Nat Peptide  Result Value Ref Range   BNP WILL FOLLOW       The ASCVD Risk score (Arnett DK, et al., 2019) failed to calculate for the following reasons:   Risk score cannot be calculated because patient has a medical history suggesting prior/existing ASCVD    Assessment & Plan:   Problem List Items Addressed This Visit       Musculoskeletal and Integument   Primary osteoarthritis of both knees   Relevant Orders   DG Knee Complete 4 Views Left   DG Knee Complete 4 Views Right     Genitourinary   CKD stage G3a/A1, GFR 45-59 and albumin creatinine ratio <30 mg/g (HCC)   No renal dosing need on ABX      Anemia in stage 3 chronic kidney disease (HCC)   Recheck CBC today       Other Visit Diagnoses       Bilateral lower extremity edema    -  Primary   Relevant Medications   cephALEXin  (KEFLEX ) 500 MG capsule   Other Relevant Orders   CBC with Differential/Platelet (Completed)   CMP14+EGFR (Completed)   TSH (Completed)   B Nat Peptide (Completed)     Ulcer of right lower extremity, limited to breakdown of skin (HCC)       Relevant Medications   cephALEXin  (KEFLEX ) 500 MG capsule   Other Relevant Orders   CBC with Differential/Platelet (Completed)    CMP14+EGFR (Completed)   TSH (Completed)   B Nat Peptide (Completed)      Bilateral lower extremity edema-will get some additional labs just to rule out thyroid  disorder, BNP etc.  Will also check a CBC with differential since she still has some erythema pain and swelling just to make sure there is no sign of systemic infection.  She does feel like it is a little better since she started the Keflex  so we will continue with the medication and send an additional 7-day supply.  Bilateral knee osteoarthritis-will get plain from today and have her schedule with Dr. ONEIDA for further workup and evaluation.  Ulcer on right pos leg.   -We really need to get some of the fluid reduced in both lower extremities.  She does have some compression stockings at home so encouraged her to use those she is applying topical mupirocin  ointment.  Other consideration would be to do an Radio broadcast assistant.  Keep feet elevated.  We even discussed possibly trial of a diuretic for 2 or 3 days but she wants to hold off until we get blood work back.  No follow-ups on file.    Dorothyann Byars, MD

## 2023-10-05 ENCOUNTER — Ambulatory Visit: Payer: Self-pay | Admitting: Family Medicine

## 2023-10-05 NOTE — Progress Notes (Signed)
 Hi Sharon Reilly, hemoglobin stable over the last year.  White blood cell counts normal no sign of systemic infection.  Kidney function is stable at 1.2.  Thyroid  level looks great.  Liver enzymes are normal.  So no sign of liver failure.  BNP still pending.

## 2023-10-07 LAB — CBC WITH DIFFERENTIAL/PLATELET
Basophils Absolute: 0 x10E3/uL (ref 0.0–0.2)
Basos: 1 %
EOS (ABSOLUTE): 0.1 x10E3/uL (ref 0.0–0.4)
Eos: 2 %
Hematocrit: 32.2 % — ABNORMAL LOW (ref 34.0–46.6)
Hemoglobin: 10.4 g/dL — ABNORMAL LOW (ref 11.1–15.9)
Immature Grans (Abs): 0 x10E3/uL (ref 0.0–0.1)
Immature Granulocytes: 0 %
Lymphocytes Absolute: 1.7 x10E3/uL (ref 0.7–3.1)
Lymphs: 34 %
MCH: 30.7 pg (ref 26.6–33.0)
MCHC: 32.3 g/dL (ref 31.5–35.7)
MCV: 95 fL (ref 79–97)
Monocytes Absolute: 0.3 x10E3/uL (ref 0.1–0.9)
Monocytes: 7 %
Neutrophils Absolute: 2.8 x10E3/uL (ref 1.4–7.0)
Neutrophils: 56 %
Platelets: 254 x10E3/uL (ref 150–450)
RBC: 3.39 x10E6/uL — ABNORMAL LOW (ref 3.77–5.28)
RDW: 13.6 % (ref 11.7–15.4)
WBC: 5 x10E3/uL (ref 3.4–10.8)

## 2023-10-07 LAB — CMP14+EGFR
ALT: 12 IU/L (ref 0–32)
AST: 17 IU/L (ref 0–40)
Albumin: 3.7 g/dL — ABNORMAL LOW (ref 3.9–4.9)
Alkaline Phosphatase: 95 IU/L (ref 44–121)
BUN/Creatinine Ratio: 20 (ref 12–28)
BUN: 25 mg/dL (ref 8–27)
Bilirubin Total: 0.2 mg/dL (ref 0.0–1.2)
CO2: 21 mmol/L (ref 20–29)
Calcium: 9 mg/dL (ref 8.7–10.3)
Chloride: 107 mmol/L — ABNORMAL HIGH (ref 96–106)
Creatinine, Ser: 1.24 mg/dL — ABNORMAL HIGH (ref 0.57–1.00)
Globulin, Total: 2.6 g/dL (ref 1.5–4.5)
Glucose: 100 mg/dL — ABNORMAL HIGH (ref 70–99)
Potassium: 3.7 mmol/L (ref 3.5–5.2)
Sodium: 142 mmol/L (ref 134–144)
Total Protein: 6.3 g/dL (ref 6.0–8.5)
eGFR: 47 mL/min/1.73 — ABNORMAL LOW (ref >59)

## 2023-10-07 LAB — BRAIN NATRIURETIC PEPTIDE: BNP: 93.7 pg/mL (ref 0.0–100.0)

## 2023-10-07 LAB — TSH: TSH: 2.68 u[IU]/mL (ref 0.450–4.500)

## 2023-10-09 NOTE — Progress Notes (Signed)
 HI Mickey, no sign of heart failure.

## 2023-10-15 NOTE — Progress Notes (Signed)
 Hi Sharon Reilly, we got your x-ray back on your knees.  You do have some moderate arthritis in all 3 compartments of both knees.  The right knee is a little bit more progressed than the left.  Neck step would be to consider formal physical therapy or consider consultation with one of our sports med docs.  We have availability with Dr. Charles in [facility].

## 2023-11-03 ENCOUNTER — Encounter: Payer: Self-pay | Admitting: Family Medicine

## 2023-11-03 ENCOUNTER — Ambulatory Visit (INDEPENDENT_AMBULATORY_CARE_PROVIDER_SITE_OTHER): Admitting: Family Medicine

## 2023-11-03 ENCOUNTER — Ambulatory Visit

## 2023-11-03 VITALS — BP 120/70 | HR 56 | Ht 61.0 in | Wt 251.0 lb

## 2023-11-03 DIAGNOSIS — R7301 Impaired fasting glucose: Secondary | ICD-10-CM | POA: Diagnosis not present

## 2023-11-03 DIAGNOSIS — L97911 Non-pressure chronic ulcer of unspecified part of right lower leg limited to breakdown of skin: Secondary | ICD-10-CM | POA: Diagnosis not present

## 2023-11-03 DIAGNOSIS — Z23 Encounter for immunization: Secondary | ICD-10-CM | POA: Diagnosis not present

## 2023-11-03 DIAGNOSIS — M67471 Ganglion, right ankle and foot: Secondary | ICD-10-CM | POA: Diagnosis not present

## 2023-11-03 DIAGNOSIS — R936 Abnormal findings on diagnostic imaging of limbs: Secondary | ICD-10-CM | POA: Diagnosis not present

## 2023-11-03 LAB — POCT GLYCOSYLATED HEMOGLOBIN (HGB A1C): Hemoglobin A1C: 5.5 % (ref 4.0–5.6)

## 2023-11-03 MED ORDER — MUPIROCIN 2 % EX OINT: 1.0000 | TOPICAL_OINTMENT | Freq: Two times a day (BID) | CUTANEOUS | 0 refills | Status: AC

## 2023-11-03 NOTE — Progress Notes (Signed)
 Established Patient Office Visit  Subjective  Patient ID: Sharon Reilly, female    DOB: 08/27/1955  Age: 68 y.o. MRN: [id number]  Chief Complaint  Patient presents with   ifg   Hypertension    HPI Discussed the use of AI scribe software for clinical note transcription with the patient, who gave verbal consent to proceed.  History of Present Illness Sharon Reilly is a 68 year old female who presents with persistent leg pain and swelling.  Right lower extremity pain and swelling - Persistent pain in the right leg for over one month, described as very painful and sometimes burning in quality - Pain worsens with cold exposure - Pain is severe enough to impact daily activities - Swelling in the right foot, more pronounced by the end of the day - Discomfort with leg elevation due to pressure on the painful area - No similar symptoms in the left foot  Cutaneous lesion of right foot - New cyst-like formation on the top of the right foot for four to five days - Lesion is firm with some give and is sore to the touch  Local wound care and topical therapy - Cleans affected area with soap and water - Applies topical treatment (mupirocin ) - Wraps leg at night to prevent irritation - Allows area to air out during the day - Requests refill of mupirocin  - No significant improvement with current regimen  Constitutional and systemic symptoms - No recent fever or other systemic symptoms  Recent weight loss and physical activity - Participating in a new exercise class on Tuesdays and Thursdays - Recent weight loss of six pounds, which is pleasing to her     ROS    Objective:     BP 120/70   Pulse (!) 56   Ht 5' 1 (1.549 m)   Wt 251 lb 0.6 oz (113.9 kg)   SpO2 97%   BMI 47.43 kg/m    Physical Exam   Results for orders placed or performed in visit on 11/03/23  POCT HgB A1C  Result Value Ref Range   Hemoglobin A1C 5.5 4.0 - 5.6 %   HbA1c POC (<> result, manual entry)      HbA1c, POC (prediabetic range)     HbA1c, POC (controlled diabetic range)      The wound on the posterior right calf measures approximately 2 x 1.3 cm.  It has a more dry appearance no active exudate very tender on exam the lesion on the right lateral leg is much smaller and appears to have maybe the scab removed.    The ASCVD Risk score (Arnett DK, et al., 2019) failed to calculate for the following reasons:   Risk score cannot be calculated because patient has a medical history suggesting prior/existing ASCVD    Assessment & Plan:   Problem List Items Addressed This Visit       Endocrine   IFG (impaired fasting glucose) - Primary   Relevant Orders   POCT HgB A1C (Completed)   Other Visit Diagnoses       Encounter for immunization       Relevant Orders   Flu vaccine HIGH DOSE PF(Fluzone Trivalent) (Completed)     Ganglion cyst of right foot       Relevant Orders   US  RT LOWER EXTREM LTD SOFT TISSUE NON VASCULAR     Non-pressure chronic ulcer of lower leg, right, limited to breakdown of skin (HCC)  Assessment and Plan Assessment & Plan Chronic right lower leg ulcer.  Limited to skin breakdown Chronic ulcer on the right lower leg persisting for over a month with minimal progress. Painful with increased pain when cold. Measures 2 by 1.3 cm. - Apply Unaboot wrap for three days to promote healing and reduce swelling. - Refer to wound center for further evaluation and management. - Instruct to return on Friday for re-evaluation and re-wrapping of the Unaboot. - Prescribe mupirocin  for topical application.  Right lower extremity edema Persistent edema in the right lower extremity, contributing to discomfort and ulcer healing issues. - Apply Unaboot wrap to help reduce swelling and promote healing of the ulcer. - She is also down 6 pounds which I do think some of that is swelling related in fact her left ankle looks fantastic she still has some trace swelling in  her right lower extremity.  Right foot synovial/ganglion cyst Firm but mushy synovial cyst on the right foot, developed four to five days ago. Unusual location. - Order ultrasound of the right foot to evaluate the synovial versus ganglion cyst. - It is tender probably from rubbing in her shoe.  If it is a ganglion cyst then we can refer to our sports med doc to have more definitively treated.    Return in about 2 days (around 11/05/2023) for Nurse visit - new Radio broadcast assistant.    Dorothyann Byars, MD

## 2023-11-04 ENCOUNTER — Ambulatory Visit: Payer: Self-pay

## 2023-11-04 NOTE — Telephone Encounter (Signed)
 Called to check on patient. Still doing o.k. - would like ot know if she should keep the nurse visit for unna boot rewrap tomorrow?

## 2023-11-04 NOTE — Telephone Encounter (Signed)
 FYI Only or Action Required?: Action required by provider: update on patient condition.  Patient was last seen in primary care on 11/03/2023 by Alvan Dorothyann BIRCH, MD.  Called Nurse Triage reporting Leg Swelling.  Symptoms began yesterday.  Interventions attempted: Nothing.  Symptoms are: gradually worsening.  Triage Disposition: Call PCP Now (overriding See HCP Within 4 Hours (Or PCP Triage))  Patient/caregiver understands and will follow disposition?:  Reason for Disposition  [1] SEVERE pain (e.g., excruciating, unable to do any normal activities) AND [2] not improved after 2 hours of pain medicine  Answer Assessment - Initial Assessment Questions Patient was transferred to this RN for lower leg pain. States she had a bandage and wrap applied in the office yesterday 11/03/23 for an ulcer. States she was advised to remove it on 11/05/23.  Patient reports right lower leg pain that began last night and has become 10/10 this morning.  This RN contacted [facility] to request advice from PCP, was unable to transfer to provider's nurse as she was in a room, stated that she would call me back as soon as she was free.  This RN advised patient to call immediately if she experiences a change in skin color or sensation to her right foot.  This RN made contact with Kim LPN at office who advised that patient has a Unaboot wrap and can loosen it at home. This RN requested confirmation of instructions d/t concerns of potential compartment syndrome.   Call was disconnected. This RN called back and Kim LPN stated that she spoke with Dr. Alvan who would like pt to remove wrap and elevate her leg, she stated that she will call the patient to follow-up after this is done. This RN contacted pt to relay message. This RN asked patient to call back if she does not experience relief in her pain.   1. ONSET: When did the pain start?      Last night 11/03/23  2. LOCATION: Where is the pain located?       Right lower leg  3. PAIN: How bad is the pain?    (Scale 1-10; or mild, moderate, severe)     10/10  5. CAUSE: What do you think is causing the leg pain?     Ace wrap applied on 11/03/23  6. OTHER SYMPTOMS: Do you have any other symptoms? (e.g., chest pain, back pain, breathing difficulty, swelling, rash, fever, numbness, weakness)     Denies change in skin color of foot, denies decrease or loss of sensation of foot  Protocols used: Leg Pain-A-AH Copied from CRM #[id number]. Topic: Clinical - Red Word Triage >> Nov 04, 2023  9:06 AM Merlynn A wrote: Red Word that prompted transfer to Nurse Triage: Cramps in leg, swelling in leg, wrap is too tight

## 2023-11-04 NOTE — Telephone Encounter (Signed)
 Spoke with patient. She has removed the bandage and the unna boot material - states pain had improved some with just removing the outside wrap - Informed that I would give her a call back at 10:30am to see If pain has improved.   Called patient back at 10:42am - she states the severe pain is improved after removing the bandage and unna boot ( still has pain but back to the level that she originally felt )  She states her skin is very itchy and she feels this may be a reaction to the calamine lotion in the unna boot. She is wiping this off of her skin at the time of the call.   She is requesting that if wound are referral is sent for her that it be to a facility as close to [place] as possible.

## 2023-11-05 ENCOUNTER — Ambulatory Visit (INDEPENDENT_AMBULATORY_CARE_PROVIDER_SITE_OTHER)

## 2023-11-05 DIAGNOSIS — L97911 Non-pressure chronic ulcer of unspecified part of right lower leg limited to breakdown of skin: Secondary | ICD-10-CM

## 2023-11-05 NOTE — Telephone Encounter (Signed)
 We can try to rewrap it if she is up to it.

## 2023-11-05 NOTE — Telephone Encounter (Signed)
 Patient informed and will be in office today at 2pm for unna boot  rewrap of leg

## 2023-11-05 NOTE — Progress Notes (Signed)
 Pt is here to get unna boot rewraped on her RLL per Dr. Alvan.                    Pt RLL wrapped in unna boot by [employer]. No RTC on file

## 2023-11-08 ENCOUNTER — Ambulatory Visit: Payer: Self-pay | Admitting: Family Medicine

## 2023-11-08 ENCOUNTER — Ambulatory Visit (INDEPENDENT_AMBULATORY_CARE_PROVIDER_SITE_OTHER)

## 2023-11-08 DIAGNOSIS — L97911 Non-pressure chronic ulcer of unspecified part of right lower leg limited to breakdown of skin: Secondary | ICD-10-CM

## 2023-11-08 NOTE — Progress Notes (Signed)
 Examined leg. The wound on the lateral leg is closed, no pink tissue exposed.  The wound posteriorly has some granulation tissue around the edge.  No active drainage.  It is nontender.  She is mostly having tenderness and pain along the lateral and medial parts of the lower leg.  Trace edema compared to her left ankle.  We discussed getting a D-dimer if positive then will get Dopplers for further workup to just rule out DVT since she is having some persistent pain she describes it as a burning sensation.  Today we dressed the wound with Xeroform, nonstick gauze and Coban dressing which could be easily removed in case we need to do a Doppler tomorrow.

## 2023-11-08 NOTE — Progress Notes (Signed)
 Hi Sharon Reilly, I know we talked about it this morning during your office visit but in case you are looking back at the note.  The ultrasound did show a benign cyst most consistent with a synovial cyst or ganglion cyst.  These are benign.  Since it is feeling a little better we will just monitor it for starts to become irritated or painful again and they can be drained.

## 2023-11-08 NOTE — Progress Notes (Signed)
 Pt here to have unna boot on RLL checked it was placed on 11/05/23. Pt removed unna boot yesterday 11/07/23 and states she has intermittent pain after that still comes and goes. Wound care referral placed today. Dr. Alvan did go in to evaluate the wound. Advised pt that we would f/u with her based on lab.

## 2023-11-09 ENCOUNTER — Ambulatory Visit: Payer: Self-pay | Admitting: Family Medicine

## 2023-11-09 DIAGNOSIS — R7989 Other specified abnormal findings of blood chemistry: Secondary | ICD-10-CM

## 2023-11-09 LAB — D-DIMER, QUANTITATIVE: D-DIMER: 1.04 mg{FEU}/L — ABNORMAL HIGH (ref 0.00–0.49)

## 2023-11-09 NOTE — Progress Notes (Signed)
 Hi Sharon Reilly, the D-dimer test did come back elevated.  That means you are at risk for a blood clot.  I am going to try to get you set up urgently tomorrow to have a Doppler done.  If for any reason the pain increases you notice any increased swelling or if you start to notice any chest pain or shortness of breath and I urged you to go to the emergency department.

## 2023-11-10 ENCOUNTER — Ambulatory Visit: Payer: Self-pay | Admitting: Family Medicine

## 2023-11-10 ENCOUNTER — Ambulatory Visit (INDEPENDENT_AMBULATORY_CARE_PROVIDER_SITE_OTHER)

## 2023-11-10 DIAGNOSIS — M79604 Pain in right leg: Secondary | ICD-10-CM

## 2023-11-10 DIAGNOSIS — M7989 Other specified soft tissue disorders: Secondary | ICD-10-CM | POA: Diagnosis not present

## 2023-11-10 DIAGNOSIS — R7989 Other specified abnormal findings of blood chemistry: Secondary | ICD-10-CM

## 2023-11-10 NOTE — Progress Notes (Signed)
 Great news!!!! No blood clot.  So we can get you in tomorrow for a new UNNA boot wrap

## 2023-11-11 NOTE — Progress Notes (Signed)
 Yes, have her coat the wound generously with Vaseline and then place some gauze on top to cover that and see if she is able to slide a compression stocking on over top to hold it in place that would actually work really well.  Keep that wound moisturized well with the Vaseline.

## 2023-11-18 ENCOUNTER — Ambulatory Visit: Payer: Self-pay

## 2023-11-18 NOTE — Telephone Encounter (Signed)
 Patient is scheduled to see Benton Gave on 10/20/23 for the following symptoms.

## 2023-11-18 NOTE — Telephone Encounter (Signed)
  FYI Only or Action Required?: Action required by provider: The tape that pt is using is causing reaction on her skin. SABRAAlso the bandages she is using are sticking to the open sore. PT would like tape and bandages prescribed. Pt also states that cyst is very painful.  Patient was last seen in primary care on 11/03/2023 by Alvan Dorothyann BIRCH, MD.  Called Nurse Triage reporting Cyst.  Symptoms began several weeks ago.  Interventions attempted: Other: Seen in office has an appt with wound care next week. Would like to be rechecked..  Symptoms are: unchanged.  Triage Disposition: See PCP When Office is Open (Within 3 Days)  Patient/caregiver understands and will follow disposition?: Yes                          Copied from CRM #[id number]. Topic: Clinical - Red Word Triage >> Nov 18, 2023  9:07 AM Olam RAMAN wrote: Red Word that prompted transfer to Nurse Triage:  Cysts on foot and is pain right leg open wound on back of leg no bleeding. Wanted to know what meds she can take for pain relief. Pain is keeping her up at night Reason for Disposition  [1] Small swelling or lump AND [2] unexplained AND [3] present > 1 week  Answer Assessment - Initial Assessment Questions 1. APPEARANCE of SWELLING: What does it look like?     Lump  2. SIZE: How large is the swelling? (e.g., inches, cm; or compare to size of pinhead, tip of pen, eraser, coin, pea, grape, ping pong ball)      Pea sized - or a little bigger 3. LOCATION: Where is the swelling located?     By pinky toe 4. ONSET: When did the swelling start?     2 weeks 5. COLOR: What color is it? Is there more than one color?     No change 6. PAIN: Is there any pain? If Yes, ask: How bad is the pain? (Scale 1-10; or mild, moderate, severe)       Yes - a lot of pain 7. ITCH: Does it itch? If Yes, ask: How bad is the itch?      no 8. CAUSE: What do you think caused the swelling?     Cyst 9 OTHER  SYMPTOMS: Do you have any other symptoms? (e.g., fever)     no  Protocols used: Skin Lump or Localized Swelling-A-AH

## 2023-11-19 ENCOUNTER — Ambulatory Visit (INDEPENDENT_AMBULATORY_CARE_PROVIDER_SITE_OTHER): Admitting: Urgent Care

## 2023-11-19 ENCOUNTER — Ambulatory Visit

## 2023-11-19 VITALS — BP 146/78 | HR 58 | Temp 98.3°F | Ht 61.0 in | Wt 252.0 lb

## 2023-11-19 DIAGNOSIS — M79671 Pain in right foot: Secondary | ICD-10-CM | POA: Diagnosis not present

## 2023-11-19 DIAGNOSIS — M7731 Calcaneal spur, right foot: Secondary | ICD-10-CM | POA: Diagnosis not present

## 2023-11-19 DIAGNOSIS — L97911 Non-pressure chronic ulcer of unspecified part of right lower leg limited to breakdown of skin: Secondary | ICD-10-CM | POA: Diagnosis not present

## 2023-11-19 DIAGNOSIS — M2011 Hallux valgus (acquired), right foot: Secondary | ICD-10-CM | POA: Diagnosis not present

## 2023-11-19 DIAGNOSIS — M19071 Primary osteoarthritis, right ankle and foot: Secondary | ICD-10-CM | POA: Diagnosis not present

## 2023-11-19 MED ORDER — CADEXOMER IODINE 0.9 % EX GEL: 1.0000 | Freq: Every day | CUTANEOUS | 0 refills | Status: AC | PRN

## 2023-11-19 NOTE — Patient Instructions (Addendum)
   Please purchase Pro-stat AWC. Take this once daily to help improve nutrition for wound care. Please also get iodosorb and use once daily to affected area.  Regarding the painful area on your R foot, please get a foot xray as this feels like a calcification on a tendon.

## 2023-11-19 NOTE — Progress Notes (Signed)
 Established Patient Office Visit  Subjective:  Patient ID: Sharon Reilly, female    DOB: 1955-02-21  Age: 68 y.o. MRN: [id number]  Chief Complaint  Patient presents with   Wound Check    HAS SEEN [facility]    Cyst    RIGHT FOOT    HPI  Discussed the use of AI scribe software for clinical note transcription with the patient, who gave verbal consent to proceed.  History of Present Illness   Sharon Reilly is a 68 year old female who presents with a painful cyst on the top of her foot. She is also concerned about an open wound to the posterior aspect of her R calf.  She has a cyst on the top of her foot, located over her baby toe, which has been present since the end of July. The cyst is extremely painful and the area is swollen. Initially, she thought it might be a spider bite due to the presence of a scab. There has been no trauma or injury to the area.  An ultrasound of her leg showed good blood flow and no blood clots. She has been using mupirocin  for dressing the sore, which has not reduced in size and has actually gotten bigger. She has been on Cefalexin twice as an oral antibiotic treatment. She is allergic to several antibiotics including amoxicillin , erythromycin, sulfa, and doxycycline . The sore has not been swabbed for infection. Compression with a stocking provides some relief, although she is concerned about the persistent swelling.  She acknowledges that she may not be consuming enough protein, which could impact wound healing. No diabetes, and her recent A1c test was normal.       Patient Active Problem List   Diagnosis Date Noted   OAB (overactive bladder) 12/18/2020   Chronic pain of right thumb 12/18/2020   Umbilical hernia without obstruction and without gangrene 06/26/2019   Hallux limitus of right foot 03/02/2019   Eczema 06/14/2018   CKD stage G3a/A1, GFR 45-59 and albumin creatinine ratio <30 mg/g (HCC) 04/08/2017   Secondary hyperparathyroidism of renal  origin 01/11/2017   Anemia in stage 3 chronic kidney disease (HCC) 11/18/2016   Hallux rigidus of right foot 09/29/2016   Primary osteoarthritis of both knees 09/29/2016   Gallstones 05/06/2016   Pure hypercholesterolemia 03/31/2016   CAD (coronary artery disease) 03/09/2016   NSTEMI (non-ST elevated myocardial infarction) (HCC) 03/08/2016   Internal hemorrhoids 10/14/2015   Gastroesophageal reflux disease with esophagitis 08/14/2015   Lymphadenopathy of right cervical region 08/05/2015   Lumbago with sciatica 11/07/2014   Left thyroid  nodule 02/01/2014   Nonpalpable mass of neck 02/01/2014   OSA (obstructive sleep apnea) 10/04/2013   Lactose intolerance 05/31/2013   IFG (impaired fasting glucose) 09/01/2012   Fibromyalgia 07/10/2010   Tubular adenoma of colon 07/10/2010   Anal fistula 07/10/2010   Essential hypertension, benign 06/26/2010   Morbid obesity (HCC) 06/26/2010   HYPERLIPIDEMIA 05/20/2010   Osteoporosis 05/20/2010   Past Medical History:  Diagnosis Date   Fibromyalgia    Hyperlipidemia    Hypertension    Lupus    Lupus (systemic lupus erythematosus) (HCC)    NSTEMI (non-ST elevated myocardial infarction) (HCC) 03/08/2016   [facility]   Obesity    Osteoarthritis    Osteoporosis    Past Surgical History:  Procedure Laterality Date   ABDOMINAL HYSTERECTOMY     partial   CARPAL TUNNEL RELEASE     left   CHOLECYSTECTOMY Bilateral 05/14/2016   epicondylitis  left   fistulotomy     great toe fusion     left   MRI syr ortho spec     Dr. Lynett   REDUCTION MAMMAPLASTY Bilateral    right elbow     surgisis biodesign fistula plug     talonavicular joint fusion     LT foot   Social History   Tobacco Use   Smoking status: Never   Smokeless tobacco: Never  Vaping Use   Vaping status: Never Used  Substance Use Topics   Alcohol use: No   Drug use: No      ROS: as noted in HPI  Objective:     BP (!) 146/78 (BP Location: Right Wrist, Patient  Position: Sitting, Cuff Size: Small)   Pulse (!) 58   Temp 98.3 F (36.8 C) (Oral)   Ht 5' 1 (1.549 m)   Wt 252 lb (114.3 kg)   SpO2 97%   BMI 47.61 kg/m  BP Readings from Last 3 Encounters:  11/19/23 (!) 146/78  11/03/23 120/70  10/04/23 133/63   Wt Readings from Last 3 Encounters:  11/19/23 252 lb (114.3 kg)  11/03/23 251 lb 0.6 oz (113.9 kg)  10/04/23 257 lb (116.6 kg)      Physical Exam Vitals and nursing note reviewed.  Constitutional:      General: She is not in acute distress.    Appearance: Normal appearance. She is obese. She is not ill-appearing, toxic-appearing or diaphoretic.  Cardiovascular:     Rate and Rhythm: Normal rate.     Pulses: Normal pulses.  Pulmonary:     Effort: Pulmonary effort is normal. No respiratory distress.  Musculoskeletal:     Right lower leg: No edema.     Left lower leg: No edema.     Right foot: Normal range of motion.     Left foot: Normal range of motion.       Legs:       Feet:  Feet:     Right foot:     Skin integrity: Skin integrity normal. No ulcer, blister, skin breakdown, erythema, warmth, callus, dry skin or fissure.     Toenail Condition: Right toenails are normal.     Left foot:     Skin integrity: Skin integrity normal.  Neurological:     Mental Status: She is alert.    Laceration repair  Date/Time: 11/19/2023 9:35 AM  Performed by: Lowella Benton CROME, PA Authorized by: Lowella Benton CROME, PA   Consent:    Consent obtained:  Verbal Treatment:    Debridement:  None Post-procedure details:    Dressing:  Non-adherent dressing Comments:     Wound was dressed in office with xeroform guaze, covered with telfa pad and wrapped with coban    No results found for any visits on 11/19/23.  Last CBC Lab Results  Component Value Date   WBC 5.0 10/04/2023   HGB 10.4 (L) 10/04/2023   HCT 32.2 (L) 10/04/2023   MCV 95 10/04/2023   MCH 30.7 10/04/2023   RDW 13.6 10/04/2023   PLT 254 10/04/2023   Last metabolic  panel Lab Results  Component Value Date   GLUCOSE 100 (H) 10/04/2023   NA 142 10/04/2023   K 3.7 10/04/2023   CL 107 (H) 10/04/2023   CO2 21 10/04/2023   BUN 25 10/04/2023   CREATININE 1.24 (H) 10/04/2023   EGFR 47 (L) 10/04/2023   CALCIUM  9.0 10/04/2023   PHOS 3.4 07/16/2018   PROT  6.3 10/04/2023   ALBUMIN 3.7 (L) 10/04/2023   LABGLOB 2.6 10/04/2023   BILITOT 0.2 10/04/2023   ALKPHOS 95 10/04/2023   AST 17 10/04/2023   ALT 12 10/04/2023   Last lipids Lab Results  Component Value Date   CHOL 158 01/01/2022   HDL 46 (L) 01/01/2022   LDLCALC 90 01/01/2022   TRIG 122 01/01/2022   CHOLHDL 3.4 01/01/2022   Last hemoglobin A1c Lab Results  Component Value Date   HGBA1C 5.5 11/03/2023      The ASCVD Risk score (Arnett DK, et al., 2019) failed to calculate for the following reasons:   Risk score cannot be calculated because patient has a medical history suggesting prior/existing ASCVD  Assessment & Plan:  Non-pressure chronic ulcer of lower leg, right, limited to breakdown of skin (HCC) -     Cadexomer Iodine; Apply 1 Application topically daily as needed for wound care.  Dispense: 40 g; Refill: 0 -     Laceration repair  Right foot pain -     DG Foot Complete Right; Future  Assessment and Plan    Chronic non-healing ulcer of right calf Chronic ulcer on right posterior calf, painful, increasing in size, no infection signs. Previous treatments ineffective. Oral antibiotics provided no relief - Recommend Prostat AWC for nutritional support. - Use iodosorb for topical treatment. - Use Coban wrap to avoid skin irritation. - Purchase Telfa non-stick bandages; guaze pads have been removing top layer preventing appropriate wound healing. - keep wound care appointment for 11/25/23 as previously scheduled  R foot pain Palpable lump between 4th and 5th digit near metatarsals. Suspect calcification vs ganglion. Painful to touch, normal ROM and skin. - obtain xray R foot to  assess for calcification vs ganglion.      I spent 32 minutes of total time managing this patient today, this includes chart review, face to face, and non-face to face time, reviewing outside records and providing medical intervention/ wound care.   No follow-ups on file.   Benton LITTIE Gave, PA

## 2023-11-20 ENCOUNTER — Encounter: Payer: Self-pay | Admitting: Urgent Care

## 2023-11-25 ENCOUNTER — Ambulatory Visit: Payer: Self-pay | Admitting: Urgent Care

## 2023-11-25 ENCOUNTER — Encounter (HOSPITAL_BASED_OUTPATIENT_CLINIC_OR_DEPARTMENT_OTHER): Attending: Internal Medicine | Admitting: Internal Medicine

## 2023-11-25 DIAGNOSIS — I87311 Chronic venous hypertension (idiopathic) with ulcer of right lower extremity: Secondary | ICD-10-CM | POA: Insufficient documentation

## 2023-11-25 DIAGNOSIS — L97812 Non-pressure chronic ulcer of other part of right lower leg with fat layer exposed: Secondary | ICD-10-CM | POA: Insufficient documentation

## 2023-12-02 ENCOUNTER — Encounter (HOSPITAL_BASED_OUTPATIENT_CLINIC_OR_DEPARTMENT_OTHER): Admitting: Internal Medicine

## 2023-12-02 ENCOUNTER — Other Ambulatory Visit: Payer: Self-pay | Admitting: Family Medicine

## 2023-12-02 DIAGNOSIS — I87311 Chronic venous hypertension (idiopathic) with ulcer of right lower extremity: Secondary | ICD-10-CM

## 2023-12-02 DIAGNOSIS — L97812 Non-pressure chronic ulcer of other part of right lower leg with fat layer exposed: Secondary | ICD-10-CM

## 2023-12-02 DIAGNOSIS — I10 Essential (primary) hypertension: Secondary | ICD-10-CM

## 2023-12-09 ENCOUNTER — Encounter (HOSPITAL_BASED_OUTPATIENT_CLINIC_OR_DEPARTMENT_OTHER): Admitting: Internal Medicine

## 2023-12-09 DIAGNOSIS — I87311 Chronic venous hypertension (idiopathic) with ulcer of right lower extremity: Secondary | ICD-10-CM | POA: Diagnosis not present

## 2023-12-09 DIAGNOSIS — L97812 Non-pressure chronic ulcer of other part of right lower leg with fat layer exposed: Secondary | ICD-10-CM | POA: Diagnosis not present

## 2023-12-16 ENCOUNTER — Encounter (HOSPITAL_BASED_OUTPATIENT_CLINIC_OR_DEPARTMENT_OTHER): Admitting: Internal Medicine

## 2023-12-16 ENCOUNTER — Other Ambulatory Visit: Payer: Self-pay | Admitting: Family Medicine

## 2023-12-16 DIAGNOSIS — L97812 Non-pressure chronic ulcer of other part of right lower leg with fat layer exposed: Secondary | ICD-10-CM

## 2023-12-16 DIAGNOSIS — I87311 Chronic venous hypertension (idiopathic) with ulcer of right lower extremity: Secondary | ICD-10-CM | POA: Diagnosis not present

## 2023-12-16 DIAGNOSIS — E7800 Pure hypercholesterolemia, unspecified: Secondary | ICD-10-CM

## 2023-12-16 NOTE — Progress Notes (Unsigned)
   12/16/2023  Patient ID: Sharon Reilly, female   DOB: [birth year], 68 y.o.   MRN: [id number]  This patient is appearing on a report for being at risk of failing the adherence measure for cholesterol (statin) medications this calendar year.   Medication: rosuvastatin  20 mg tablets Last fill date: 11/18/23 for 24 day supply  MyChart message sent to patient.  Linford Quintela C. Dashauna Heymann [facility] PharmD Candidate Class of [phone]

## 2023-12-23 ENCOUNTER — Encounter (HOSPITAL_BASED_OUTPATIENT_CLINIC_OR_DEPARTMENT_OTHER): Admitting: Internal Medicine

## 2023-12-29 ENCOUNTER — Encounter (HOSPITAL_BASED_OUTPATIENT_CLINIC_OR_DEPARTMENT_OTHER): Attending: Internal Medicine | Admitting: Internal Medicine

## 2023-12-29 DIAGNOSIS — L97812 Non-pressure chronic ulcer of other part of right lower leg with fat layer exposed: Secondary | ICD-10-CM | POA: Diagnosis present

## 2023-12-29 DIAGNOSIS — I87311 Chronic venous hypertension (idiopathic) with ulcer of right lower extremity: Secondary | ICD-10-CM

## 2023-12-30 ENCOUNTER — Encounter (HOSPITAL_BASED_OUTPATIENT_CLINIC_OR_DEPARTMENT_OTHER): Admitting: Internal Medicine

## 2024-01-06 ENCOUNTER — Encounter (HOSPITAL_BASED_OUTPATIENT_CLINIC_OR_DEPARTMENT_OTHER): Admitting: Internal Medicine

## 2024-01-20 ENCOUNTER — Encounter (HOSPITAL_BASED_OUTPATIENT_CLINIC_OR_DEPARTMENT_OTHER): Attending: Internal Medicine | Admitting: Internal Medicine

## 2024-01-20 DIAGNOSIS — I87311 Chronic venous hypertension (idiopathic) with ulcer of right lower extremity: Secondary | ICD-10-CM | POA: Insufficient documentation

## 2024-01-20 DIAGNOSIS — L97812 Non-pressure chronic ulcer of other part of right lower leg with fat layer exposed: Secondary | ICD-10-CM | POA: Insufficient documentation

## 2024-01-25 ENCOUNTER — Encounter (HOSPITAL_BASED_OUTPATIENT_CLINIC_OR_DEPARTMENT_OTHER): Admitting: Internal Medicine

## 2024-01-25 DIAGNOSIS — L97812 Non-pressure chronic ulcer of other part of right lower leg with fat layer exposed: Secondary | ICD-10-CM | POA: Diagnosis not present

## 2024-01-25 DIAGNOSIS — I87311 Chronic venous hypertension (idiopathic) with ulcer of right lower extremity: Secondary | ICD-10-CM | POA: Diagnosis present

## 2024-01-27 ENCOUNTER — Encounter (HOSPITAL_BASED_OUTPATIENT_CLINIC_OR_DEPARTMENT_OTHER): Admitting: Internal Medicine

## 2024-02-03 ENCOUNTER — Encounter (HOSPITAL_BASED_OUTPATIENT_CLINIC_OR_DEPARTMENT_OTHER): Admitting: Internal Medicine

## 2024-02-03 DIAGNOSIS — L97812 Non-pressure chronic ulcer of other part of right lower leg with fat layer exposed: Secondary | ICD-10-CM | POA: Diagnosis not present

## 2024-02-03 DIAGNOSIS — I87311 Chronic venous hypertension (idiopathic) with ulcer of right lower extremity: Secondary | ICD-10-CM | POA: Diagnosis not present

## 2024-02-09 ENCOUNTER — Encounter (HOSPITAL_BASED_OUTPATIENT_CLINIC_OR_DEPARTMENT_OTHER): Admitting: General Surgery

## 2024-02-09 DIAGNOSIS — I87311 Chronic venous hypertension (idiopathic) with ulcer of right lower extremity: Secondary | ICD-10-CM | POA: Diagnosis not present

## 2024-02-16 ENCOUNTER — Encounter (HOSPITAL_BASED_OUTPATIENT_CLINIC_OR_DEPARTMENT_OTHER): Admitting: Internal Medicine

## 2024-02-16 DIAGNOSIS — I87311 Chronic venous hypertension (idiopathic) with ulcer of right lower extremity: Secondary | ICD-10-CM

## 2024-02-16 DIAGNOSIS — L97812 Non-pressure chronic ulcer of other part of right lower leg with fat layer exposed: Secondary | ICD-10-CM | POA: Diagnosis not present

## 2024-02-24 ENCOUNTER — Encounter (HOSPITAL_BASED_OUTPATIENT_CLINIC_OR_DEPARTMENT_OTHER): Attending: Internal Medicine | Admitting: Internal Medicine

## 2024-02-24 DIAGNOSIS — I87311 Chronic venous hypertension (idiopathic) with ulcer of right lower extremity: Secondary | ICD-10-CM | POA: Insufficient documentation

## 2024-02-24 DIAGNOSIS — L97812 Non-pressure chronic ulcer of other part of right lower leg with fat layer exposed: Secondary | ICD-10-CM | POA: Insufficient documentation

## 2024-03-01 ENCOUNTER — Encounter (HOSPITAL_BASED_OUTPATIENT_CLINIC_OR_DEPARTMENT_OTHER): Admitting: Internal Medicine

## 2024-03-02 ENCOUNTER — Encounter (HOSPITAL_BASED_OUTPATIENT_CLINIC_OR_DEPARTMENT_OTHER): Admitting: Internal Medicine

## 2024-03-02 DIAGNOSIS — L97812 Non-pressure chronic ulcer of other part of right lower leg with fat layer exposed: Secondary | ICD-10-CM

## 2024-03-02 DIAGNOSIS — I87311 Chronic venous hypertension (idiopathic) with ulcer of right lower extremity: Secondary | ICD-10-CM | POA: Diagnosis not present

## 2024-03-05 ENCOUNTER — Other Ambulatory Visit: Payer: Self-pay | Admitting: Family Medicine

## 2024-03-09 ENCOUNTER — Encounter (HOSPITAL_BASED_OUTPATIENT_CLINIC_OR_DEPARTMENT_OTHER): Admitting: Internal Medicine

## 2024-03-09 DIAGNOSIS — I87311 Chronic venous hypertension (idiopathic) with ulcer of right lower extremity: Secondary | ICD-10-CM | POA: Diagnosis not present

## 2024-03-09 DIAGNOSIS — L97812 Non-pressure chronic ulcer of other part of right lower leg with fat layer exposed: Secondary | ICD-10-CM

## 2024-03-16 ENCOUNTER — Encounter (HOSPITAL_BASED_OUTPATIENT_CLINIC_OR_DEPARTMENT_OTHER): Admitting: Internal Medicine

## 2024-03-16 DIAGNOSIS — L97812 Non-pressure chronic ulcer of other part of right lower leg with fat layer exposed: Secondary | ICD-10-CM | POA: Diagnosis not present

## 2024-03-16 DIAGNOSIS — I87311 Chronic venous hypertension (idiopathic) with ulcer of right lower extremity: Secondary | ICD-10-CM | POA: Diagnosis not present

## 2024-03-21 ENCOUNTER — Other Ambulatory Visit: Payer: Self-pay | Admitting: Family Medicine

## 2024-03-21 DIAGNOSIS — Z1231 Encounter for screening mammogram for malignant neoplasm of breast: Secondary | ICD-10-CM

## 2024-03-23 ENCOUNTER — Encounter (HOSPITAL_BASED_OUTPATIENT_CLINIC_OR_DEPARTMENT_OTHER): Admitting: Internal Medicine

## 2024-03-29 ENCOUNTER — Encounter (HOSPITAL_BASED_OUTPATIENT_CLINIC_OR_DEPARTMENT_OTHER): Admitting: Internal Medicine

## 2024-03-30 ENCOUNTER — Ambulatory Visit

## 2024-04-06 ENCOUNTER — Encounter (HOSPITAL_BASED_OUTPATIENT_CLINIC_OR_DEPARTMENT_OTHER): Admitting: Internal Medicine

## 2024-04-28 ENCOUNTER — Ambulatory Visit
